# Patient Record
Sex: Female | Born: 1995 | Race: Black or African American | Hispanic: No | Marital: Single | Smoking: Former smoker
Health system: Southern US, Community
[De-identification: ages and names within clinical notes are randomized; demographics above are authoritative.]

## PROBLEM LIST (undated history)

## (undated) ENCOUNTER — Inpatient Hospital Stay (HOSPITAL_COMMUNITY): Payer: Self-pay

## (undated) DIAGNOSIS — F32A Depression, unspecified: Secondary | ICD-10-CM

## (undated) DIAGNOSIS — H539 Unspecified visual disturbance: Secondary | ICD-10-CM

## (undated) DIAGNOSIS — D649 Anemia, unspecified: Secondary | ICD-10-CM

## (undated) DIAGNOSIS — F419 Anxiety disorder, unspecified: Secondary | ICD-10-CM

## (undated) DIAGNOSIS — B009 Herpesviral infection, unspecified: Secondary | ICD-10-CM

## (undated) DIAGNOSIS — K5900 Constipation, unspecified: Secondary | ICD-10-CM

## (undated) DIAGNOSIS — F329 Major depressive disorder, single episode, unspecified: Secondary | ICD-10-CM

## (undated) HISTORY — PX: WISDOM TOOTH EXTRACTION: SHX21

---
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
## 2016-01-27 ENCOUNTER — Emergency Department (HOSPITAL_COMMUNITY)
Admission: EM | Admit: 2016-01-27 | Discharge: 2016-01-27 | Disposition: A | Discharge status: Home / Self Care | Payer: BLUE CROSS/BLUE SHIELD | Attending: Physician Assistant | Admitting: Physician Assistant

## 2016-01-27 ENCOUNTER — Encounter (HOSPITAL_COMMUNITY): Payer: Self-pay | Admitting: *Deleted

## 2016-01-27 DIAGNOSIS — R1030 Lower abdominal pain, unspecified: Secondary | ICD-10-CM

## 2016-01-27 DIAGNOSIS — R1033 Periumbilical pain: Secondary | ICD-10-CM | POA: Diagnosis not present

## 2016-01-27 DIAGNOSIS — F17200 Nicotine dependence, unspecified, uncomplicated: Secondary | ICD-10-CM | POA: Diagnosis not present

## 2016-01-27 LAB — URINALYSIS, ROUTINE W REFLEX MICROSCOPIC
Glucose, UA: NEGATIVE mg/dL
Hgb urine dipstick: NEGATIVE
Ketones, ur: NEGATIVE mg/dL
LEUKOCYTES UA: NEGATIVE
NITRITE: NEGATIVE
Protein, ur: NEGATIVE mg/dL
SPECIFIC GRAVITY, URINE: 1.027 (ref 1.005–1.030)
pH: 6.5 (ref 5.0–8.0)

## 2016-01-27 LAB — LIPASE, BLOOD: Lipase: 25 U/L (ref 11–51)

## 2016-01-27 LAB — COMPREHENSIVE METABOLIC PANEL
ALK PHOS: 45 U/L (ref 38–126)
ALT: 12 U/L — ABNORMAL LOW (ref 14–54)
ANION GAP: 8 (ref 5–15)
AST: 20 U/L (ref 15–41)
Albumin: 3.7 g/dL (ref 3.5–5.0)
BILIRUBIN TOTAL: 0.9 mg/dL (ref 0.3–1.2)
BUN: 10 mg/dL (ref 6–20)
CALCIUM: 9 mg/dL (ref 8.9–10.3)
CO2: 25 mmol/L (ref 22–32)
Chloride: 106 mmol/L (ref 101–111)
Creatinine, Ser: 0.77 mg/dL (ref 0.44–1.00)
GFR calc non Af Amer: >60 mL/min (ref >60)
Glucose, Bld: 92 mg/dL (ref 65–99)
POTASSIUM: 3.6 mmol/L (ref 3.5–5.1)
SODIUM: 139 mmol/L (ref 135–145)
TOTAL PROTEIN: 7.2 g/dL (ref 6.5–8.1)

## 2016-01-27 LAB — CBC
HEMATOCRIT: 39 % (ref 36.0–46.0)
HEMOGLOBIN: 12.6 g/dL (ref 12.0–15.0)
MCH: 26.5 pg (ref 26.0–34.0)
MCHC: 32.3 g/dL (ref 30.0–36.0)
MCV: 81.9 fL (ref 78.0–100.0)
Platelets: 321 10*3/uL (ref 150–400)
RBC: 4.76 MIL/uL (ref 3.87–5.11)
RDW: 14.9 % (ref 11.5–15.5)
WBC: 6.8 10*3/uL (ref 4.0–10.5)

## 2016-01-27 LAB — I-STAT BETA HCG BLOOD, ED (MC, WL, AP ONLY)

## 2016-01-27 MED ORDER — ONDANSETRON 4 MG PO TBDP
ORAL_TABLET | ORAL | Status: AC
  Filled 2016-01-27: qty 1

## 2016-01-27 MED ORDER — NAPROXEN 500 MG PO TABS
500.0000 mg | ORAL_TABLET | Freq: Two times a day (BID) | ORAL | 0 refills | Status: DC
Start: 2016-01-27 — End: 2016-03-24

## 2016-01-27 MED ORDER — ONDANSETRON 4 MG PO TBDP: 4.0000 mg | ORAL_TABLET | Freq: Three times a day (TID) | ORAL | 0 refills | Status: DC | PRN

## 2016-01-27 MED ORDER — NAPROXEN 250 MG PO TABS
500.0000 mg | ORAL_TABLET | Freq: Once | ORAL | Status: AC
  Administered 2016-01-27: 500 mg via ORAL
  Filled 2016-01-27: qty 2

## 2016-01-27 MED ORDER — ONDANSETRON 4 MG PO TBDP
4.0000 mg | ORAL_TABLET | Freq: Once | ORAL | Status: AC | PRN
  Administered 2016-01-27: 4 mg via ORAL

## 2016-01-27 NOTE — ED Notes (Signed)
Pt. Verbalized understanding of discharge instructions with no questions. VSS NAD

## 2016-01-27 NOTE — ED Provider Notes (Signed)
[facility]-EMERGENCY DEPT Provider Note   CSN: [id number] Arrival date & time: 01/27/16  1157     History   Chief Complaint Chief Complaint  Patient presents with  . Abdominal Pain    HPI Haley Lopez is a 20 y.o. female.  Patient with no past surgical history presents with complaint of abdominal pain and vomiting. Abdominal pain started last night. It is described as a sharp pain in her midline lower abdomen which is intermittent. Pain comes on approximately once per hour and is present for several minutes before resolving completely. Patient has had intermittent episodes of vomiting as well which does not necessarily coincide with the abdominal pain. Patient denies fever, chest pain, shortness of breath, diarrhea, urinary symptoms, vaginal bleeding or discharge. Last menstrual period was one week ago and was normal for the patient, however it was 1-2 weeks early. Patient does not feel that she is at high risk for sexually transmitted disease. No history of ovarian cysts. Patient currently is pain-free and has not vomited in the emergency department. Patient received Zofran upon arrival which has helped her feel better. The onset of this condition was acute. The course is constant. Aggravating factors: none. Alleviating factors: none.        Past Medical History:  Diagnosis Date  . Anxiety   . Constipation   . Vision abnormalities     Patient Active Problem List   Diagnosis Date Noted  . Normal labor 04/08/2015  . Preterm premature rupture of membranes (PPROM) with onset of labor within 24 hours of rupture in third trimester, antepartum 04/08/2015  . SVD (spontaneous vaginal delivery) 04/08/2015  . Depression, major, single episode, moderate (HCC) 02/25/2011  . 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 NO PAST SURGERIES      OB History    Gravida Para Term Preterm AB Living   1 1   1   1      SAB TAB Ectopic Multiple Live Births         0 1       Home Medications    Prior to Admission medications   Medication Sig Start Date End Date Taking? Authorizing Provider  naproxen (NAPROSYN) 500 MG tablet Take 1 tablet (500 mg total) by mouth 2 (two) times daily. 01/27/16   Renne CriglerJoshua Jhett Fretwell, PA-C  ondansetron (ZOFRAN ODT) 4 MG disintegrating tablet Take 1 tablet (4 mg total) by mouth every 8 (eight) hours as needed for nausea or vomiting. 01/27/16   Renne CriglerJoshua Madelynn Malson, PA-C    Family History Family History  Problem Relation Age of Onset  . Depression Mother     Social History Social History  Substance Use Topics  . Smoking status: Current Every Day Smoker  . Smokeless tobacco: Never Used  . Alcohol use No     Allergies   Review of patient's allergies indicates no known allergies.   Review of Systems Review of Systems  Constitutional: Negative for fever.  HENT: Negative for rhinorrhea and sore throat.   Eyes: Negative for redness.  Respiratory: Negative for cough.   Cardiovascular: Negative for chest pain.  Gastrointestinal: Positive for abdominal pain, nausea and vomiting. Negative for diarrhea.  Genitourinary: Negative for dysuria.  Musculoskeletal: Negative for myalgias.  Skin: Negative for rash.  Neurological: Negative for headaches.     Physical Exam Updated Vital Signs BP 113/79 (BP Location: Left Arm)   Pulse 86   Temp 99.2  F (37.3 C) (Oral)   Resp 19   LMP 01/25/2016   SpO2 100%   Physical Exam  Constitutional: She appears well-developed and well-nourished.  HENT:  Head: Normocephalic and atraumatic.  Eyes: Conjunctivae are normal. Right eye exhibits no discharge. Left eye exhibits no discharge.  Neck: Normal range of motion. Neck supple.  Cardiovascular: Normal rate, regular rhythm and normal heart sounds.   Pulmonary/Chest: Effort normal and breath sounds normal.  Abdominal: Soft. Bowel sounds are normal. There is tenderness (Mild) in the  periumbilical area and suprapubic area. There is no rebound, no guarding, no CVA tenderness and no tenderness at McBurney's point.  Neurological: She is alert.  Skin: Skin is warm and dry.  Psychiatric: She has a normal mood and affect.  Nursing note and vitals reviewed.    ED Treatments / Results  Labs (all labs ordered are listed, but only abnormal results are displayed) Labs Reviewed  COMPREHENSIVE METABOLIC PANEL - Abnormal; Notable for the following:       Result Value   ALT 12 (*)    All other components within normal limits  URINALYSIS, ROUTINE W REFLEX MICROSCOPIC (NOT AT [facility]) - Abnormal; Notable for the following:    Color, Urine AMBER (*)    APPearance HAZY (*)    Bilirubin Urine SMALL (*)    All other components within normal limits  LIPASE, BLOOD  CBC  I-STAT BETA HCG BLOOD, ED ([facility], [facility], [facility] ONLY)    EKG  EKG Interpretation None       Radiology No results found.  Procedures Procedures (including critical care time)  Medications Ordered in ED Medications  ondansetron (ZOFRAN-ODT) disintegrating tablet 4 mg (4 mg Oral Given 01/27/16 1209)  naproxen (NAPROSYN) tablet 500 mg (500 mg Oral Given 01/27/16 1334)     Initial Impression / Assessment and Plan / ED Course  I have reviewed the triage vital signs and the nursing notes.  Pertinent labs & imaging results that were available during my care of the patient were reviewed by me and considered in my medical decision making (see chart for details).  Clinical Course   Patient seen and examined. Work-up initiated. Medications ordered.   Vital signs reviewed and are as follows: BP 113/79 (BP Location: Left Arm)   Pulse 86   Temp 99.2 F (37.3 C) (Oral)   Resp 19   LMP 01/25/2016   SpO2 100%   Patient is minimally symptomatic at this time. Mild pain at time of exam. Pain is nonfocal in nature. No pain at McBurney's point. No lateralizing pain. We discussed all lab results. White blood cell count is  reassuring. Urine does not show infection. Patient is not pregnant.  She was given fluid trial as well as naproxen. This has improved her symptoms and she is feeling well. No change in exam at time of discharge. We discussed the unpredictable nature of abdominal pain and if her symptoms change or worsen, she needs to return to the emergency department or follow-up with her primary care physician for further evaluation and treatment.  The patient was urged to return to the Emergency Department immediately with worsening of current symptoms, worsening abdominal pain, persistent vomiting, blood noted in stools, fever, or any other concerns. The patient verbalized understanding.    Final Clinical Impressions(s) / ED Diagnoses   Final diagnoses:  Lower abdominal pain   Patient with abdominal pain, Reassuring exam and labs. Vitals are stable, no fever. Labs normal white blood cell count, normal UA.  Imaging not felt indicated at current time. No signs of dehydration, patient is tolerating PO's. Lungs are clear and no signs suggestive of PNA. No focal abd pain on exam. Pain is colicky in nature. Low concern for appendicitis, cholecystitis, pancreatitis, ruptured viscus, UTI, kidney stone, aortic dissection, aortic aneurysm or other emergent abdominal etiology. Doubt PID/TOA given lack of vaginal complaints. Doubt ovarian torsion given lack of persistent pain, lateral pain. Supportive therapy indicated with return if symptoms worsen.     New Prescriptions Discharge Medication List as of 01/27/2016  4:14 PM    START taking these medications   Details  naproxen (NAPROSYN) 500 MG tablet Take 1 tablet (500 mg total) by mouth 2 (two) times daily., Starting Wed 01/27/2016, Print    ondansetron (ZOFRAN ODT) 4 MG disintegrating tablet Take 1 tablet (4 mg total) by mouth every 8 (eight) hours as needed for nausea or vomiting., Starting Wed 01/27/2016, Print         [place], PA-C 01/27/16 1653      Courteney Randall An, MD 01/28/16 1516

## 2016-01-27 NOTE — ED Notes (Signed)
Patient given a cup of ice and a coke to drink

## 2016-01-27 NOTE — ED Triage Notes (Signed)
Pt reports having N/V and abdominal pain for several days. Pt reports that her period came 2 weeks early this time as well.

## 2016-01-27 NOTE — Discharge Instructions (Signed)
Please read and follow all provided instructions.  Your diagnoses today include:  1. Lower abdominal pain     Tests performed today include:  Blood counts and electrolytes  Blood tests to check liver and kidney function  Blood tests to check pancreas function  Urine test to look for infection and pregnancy  Vital signs. See below for your results today.   Medications prescribed:   Zofran (ondansetron) - for nausea and vomiting   Naproxen - anti-inflammatory pain medication  Do not exceed 500mg  naproxen every 12 hours, take with food  You have been prescribed an anti-inflammatory medication or NSAID. Take with food. Take smallest effective dose for the shortest duration needed for your pain. Stop taking if you experience stomach pain or vomiting.   Take any prescribed medications only as directed.  Home care instructions:   Follow any educational materials contained in this packet.  Follow-up instructions: Please follow-up with your primary care provider in the next 3 days for further evaluation of your symptoms.    Return instructions:  SEEK IMMEDIATE MEDICAL ATTENTION IF:  The pain does not go away or becomes severe   A temperature above 101F develops   Repeated vomiting occurs (multiple episodes)   The pain becomes localized to portions of the abdomen. The right side could possibly be appendicitis. In an adult, the left lower portion of the abdomen could be colitis or diverticulitis.   Blood is being passed in stools or vomit (bright red or black tarry stools)   You develop chest pain, difficulty breathing, dizziness or fainting, or become confused, poorly responsive, or inconsolable (young children)  If you have any other emergent concerns regarding your health  Additional Information: Abdominal (belly) pain can be caused by many things. Your caregiver performed an examination and possibly ordered blood/urine tests and imaging (CT scan, x-rays, ultrasound).  Many cases can be observed and treated at home after initial evaluation in the emergency department. Even though you are being discharged home, abdominal pain can be unpredictable. Therefore, you need a repeated exam if your pain does not resolve, returns, or worsens. Most patients with abdominal pain don't have to be admitted to the hospital or have surgery, but serious problems like appendicitis and gallbladder attacks can start out as nonspecific pain. Many abdominal conditions cannot be diagnosed in one visit, so follow-up evaluations are very important.  Your vital signs today were: BP 111/73 (BP Location: Left Arm)    Pulse 85    Temp 99.2 F (37.3 C) (Oral)    Resp 20    LMP 01/25/2016    SpO2 100%  If your blood pressure (bp) was elevated above 135/85 this visit, please have this repeated by your doctor within one month. --------------

## 2016-03-24 ENCOUNTER — Encounter (HOSPITAL_COMMUNITY): Payer: Self-pay | Admitting: *Deleted

## 2016-03-24 ENCOUNTER — Inpatient Hospital Stay (HOSPITAL_COMMUNITY): Payer: BLUE CROSS/BLUE SHIELD

## 2016-03-24 ENCOUNTER — Inpatient Hospital Stay (HOSPITAL_COMMUNITY)
Admission: AD | Admit: 2016-03-24 | Discharge: 2016-03-24 | Disposition: A | Discharge status: Home / Self Care | Payer: BLUE CROSS/BLUE SHIELD | Source: Ambulatory Visit | Attending: Obstetrics and Gynecology | Admitting: Obstetrics and Gynecology

## 2016-03-24 DIAGNOSIS — IMO0002 Reserved for concepts with insufficient information to code with codable children: Secondary | ICD-10-CM | POA: Diagnosis present

## 2016-03-24 DIAGNOSIS — Z3491 Encounter for supervision of normal pregnancy, unspecified, first trimester: Secondary | ICD-10-CM

## 2016-03-24 DIAGNOSIS — R102 Pelvic and perineal pain: Secondary | ICD-10-CM | POA: Diagnosis not present

## 2016-03-24 DIAGNOSIS — O209 Hemorrhage in early pregnancy, unspecified: Secondary | ICD-10-CM | POA: Diagnosis not present

## 2016-03-24 DIAGNOSIS — R1030 Lower abdominal pain, unspecified: Secondary | ICD-10-CM | POA: Diagnosis present

## 2016-03-24 DIAGNOSIS — O26891 Other specified pregnancy related conditions, first trimester: Secondary | ICD-10-CM

## 2016-03-24 DIAGNOSIS — Z3A01 Less than 8 weeks gestation of pregnancy: Secondary | ICD-10-CM | POA: Diagnosis not present

## 2016-03-24 HISTORY — DX: Major depressive disorder, single episode, unspecified: F32.9

## 2016-03-24 HISTORY — DX: Depression, unspecified: F32.A

## 2016-03-24 LAB — CBC
HCT: 33.4 % — ABNORMAL LOW (ref 36.0–46.0)
HEMOGLOBIN: 10.9 g/dL — AB (ref 12.0–15.0)
MCH: 26.7 pg (ref 26.0–34.0)
MCHC: 32.6 g/dL (ref 30.0–36.0)
MCV: 81.9 fL (ref 78.0–100.0)
Platelets: 283 10*3/uL (ref 150–400)
RBC: 4.08 MIL/uL (ref 3.87–5.11)
RDW: 15 % (ref 11.5–15.5)
WBC: 5.4 10*3/uL (ref 4.0–10.5)

## 2016-03-24 LAB — URINALYSIS, ROUTINE W REFLEX MICROSCOPIC
BILIRUBIN URINE: NEGATIVE
GLUCOSE, UA: NEGATIVE mg/dL
HGB URINE DIPSTICK: NEGATIVE
Ketones, ur: NEGATIVE mg/dL
Leukocytes, UA: NEGATIVE
Nitrite: NEGATIVE
PH: 7 (ref 5.0–8.0)
Protein, ur: NEGATIVE mg/dL
SPECIFIC GRAVITY, URINE: 1.02 (ref 1.005–1.030)

## 2016-03-24 LAB — WET PREP, GENITAL
Clue Cells Wet Prep HPF POC: NONE SEEN
SPERM: NONE SEEN
Trich, Wet Prep: NONE SEEN
YEAST WET PREP: NONE SEEN

## 2016-03-24 LAB — POCT PREGNANCY, URINE: Preg Test, Ur: POSITIVE — AB

## 2016-03-24 LAB — HCG, QUANTITATIVE, PREGNANCY: HCG, BETA CHAIN, QUANT, S: 31494 m[IU]/mL — AB (ref <5)

## 2016-03-24 LAB — HEPATITIS B SURFACE ANTIGEN: HEP B S AG: NEGATIVE
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
## 2016-03-24 NOTE — Discharge Instructions (Signed)
Vaginal Bleeding During Pregnancy, First Trimester °A small amount of bleeding (spotting) from the vagina is relatively common in early pregnancy. It usually stops on its own. Various things may cause bleeding or spotting in early pregnancy. Some bleeding may be related to the pregnancy, and some may not. In most cases, the bleeding is normal and is not a problem. However, bleeding can also be a sign of something serious. Be sure to tell your health care provider about any vaginal bleeding right away. °Some possible causes of vaginal bleeding during the first trimester include: °· Infection or inflammation of the cervix. °· Growths (polyps) on the cervix. °· Miscarriage or threatened miscarriage. °· Pregnancy tissue has developed outside of the uterus and in a fallopian tube (tubal pregnancy). °· Tiny cysts have developed in the uterus instead of pregnancy tissue (molar pregnancy). °Follow these instructions at home: °Watch your condition for any changes. The following actions may help to lessen any discomfort you are feeling: °· Follow your health care provider's instructions for limiting your activity. If your health care provider orders bed rest, you may need to stay in bed and only get up to use the bathroom. However, your health care provider may allow you to continue light activity. °· If needed, make plans for someone to help with your regular activities and responsibilities while you are on bed rest. °· Keep track of the number of pads you use each day, how often you change pads, and how soaked (saturated) they are. Write this down. °· Do not use tampons. Do not douche. °· Do not have sexual intercourse or orgasms until approved by your health care provider. °· If you pass any tissue from your vagina, save the tissue so you can show it to your health care provider. °· Only take over-the-counter or prescription medicines as directed by your health care provider. °· Do not take aspirin because it can make you  bleed. °· Keep all follow-up appointments as directed by your health care provider. °Contact a health care provider if: °· You have any vaginal bleeding during any part of your pregnancy. °· You have cramps or labor pains. °· You have a fever, not controlled by medicine. °Get help right away if: °· You have severe cramps in your back or belly (abdomen). °· You pass large clots or tissue from your vagina. °· Your bleeding increases. °· You feel light-headed or weak, or you have fainting episodes. °· You have chills. °· You are leaking fluid or have a gush of fluid from your vagina. °· You pass out while having a bowel movement. °This information is not intended to replace advice given to you by your health care provider. Make sure you discuss any questions you have with your health care provider. °Document Released: 01/05/2005 Document Revised: 09/03/2015 Document Reviewed: 12/03/2012 °Elsevier Interactive Patient Education © 2017 [employer] ° °
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
## 2016-03-25 LAB — HSV 1 ANTIBODY, IGG: HSV 1 Glycoprotein G Ab, IgG: <0.91 index (ref 0.00–0.90)

## 2016-03-25 LAB — GC/CHLAMYDIA PROBE AMP (~~LOC~~) NOT AT ARMC
CHLAMYDIA, DNA PROBE: NEGATIVE
NEISSERIA GONORRHEA: NEGATIVE

## 2016-03-25 LAB — RPR: RPR: NONREACTIVE

## 2016-03-25 LAB — HIV ANTIBODY (ROUTINE TESTING W REFLEX): HIV SCREEN 4TH GENERATION: NONREACTIVE

## 2016-03-25 LAB — HSV 2 ANTIBODY, IGG: HSV 2 Glycoprotein G Ab, IgG: <0.91 index (ref 0.00–0.90)

## 2016-04-21 ENCOUNTER — Ambulatory Visit (HOSPITAL_COMMUNITY)
Admission: AD | Admit: 2016-04-21 | Discharge: 2016-04-21 | Disposition: A | Discharge status: Home / Self Care | Payer: BLUE CROSS/BLUE SHIELD | Source: Ambulatory Visit | Attending: Obstetrics and Gynecology | Admitting: Obstetrics and Gynecology

## 2016-04-21 ENCOUNTER — Encounter (HOSPITAL_COMMUNITY): Payer: Self-pay | Admitting: Anesthesiology

## 2016-04-21 ENCOUNTER — Encounter (HOSPITAL_COMMUNITY)
Admission: AD | Disposition: A | Discharge status: Home / Self Care | Payer: Self-pay | Source: Ambulatory Visit | Attending: Obstetrics and Gynecology

## 2016-04-21 ENCOUNTER — Ambulatory Visit (HOSPITAL_COMMUNITY): Payer: BLUE CROSS/BLUE SHIELD | Admitting: Anesthesiology

## 2016-04-21 DIAGNOSIS — O021 Missed abortion: Secondary | ICD-10-CM

## 2016-04-21 DIAGNOSIS — F419 Anxiety disorder, unspecified: Secondary | ICD-10-CM | POA: Diagnosis not present

## 2016-04-21 DIAGNOSIS — F329 Major depressive disorder, single episode, unspecified: Secondary | ICD-10-CM | POA: Diagnosis not present

## 2016-04-21 HISTORY — PX: DILATION AND EVACUATION: SHX1459

## 2016-04-21 LAB — CBC
HEMATOCRIT: 31.6 % — AB (ref 36.0–46.0)
HEMOGLOBIN: 10.5 g/dL — AB (ref 12.0–15.0)
MCH: 26.8 pg (ref 26.0–34.0)
MCHC: 33.2 g/dL (ref 30.0–36.0)
MCV: 80.6 fL (ref 78.0–100.0)
Platelets: 306 10*3/uL (ref 150–400)
RBC: 3.92 MIL/uL (ref 3.87–5.11)
RDW: 15 % (ref 11.5–15.5)
WBC: 6.2 10*3/uL (ref 4.0–10.5)

## 2016-04-21 SURGERY — DILATION AND EVACUATION, UTERUS
Anesthesia: Monitor Anesthesia Care | Site: Vagina

## 2016-04-21 MED ORDER — LACTATED RINGERS IV SOLN
INTRAVENOUS | Status: DC
  Administered 2016-04-21: 125 mL/h via INTRAVENOUS

## 2016-04-21 MED ORDER — DOXYCYCLINE HYCLATE 100 MG IV SOLR
200.0000 mg | Freq: Once | INTRAVENOUS | Status: AC
  Administered 2016-04-21: 200 mg via INTRAVENOUS
  Filled 2016-04-21: qty 200

## 2016-04-21 MED ORDER — LIDOCAINE HCL (CARDIAC) 20 MG/ML IV SOLN
INTRAVENOUS | Status: AC
  Filled 2016-04-21: qty 5

## 2016-04-21 MED ORDER — GLYCOPYRROLATE 0.2 MG/ML IJ SOLN
INTRAMUSCULAR | Status: DC | PRN
  Administered 2016-04-21: 0.1 mg via INTRAVENOUS

## 2016-04-21 MED ORDER — KETOROLAC TROMETHAMINE 30 MG/ML IJ SOLN
INTRAMUSCULAR | Status: DC | PRN
  Administered 2016-04-21: 30 mg via INTRAVENOUS

## 2016-04-21 MED ORDER — METOCLOPRAMIDE HCL 5 MG/ML IJ SOLN
INTRAMUSCULAR | Status: AC
  Filled 2016-04-21: qty 2

## 2016-04-21 MED ORDER — ONDANSETRON HCL 4 MG/2ML IJ SOLN
INTRAMUSCULAR | Status: DC | PRN
  Administered 2016-04-21: 4 mg via INTRAVENOUS

## 2016-04-21 MED ORDER — FENTANYL CITRATE (PF) 100 MCG/2ML IJ SOLN
INTRAMUSCULAR | Status: DC | PRN
  Administered 2016-04-21: 100 ug via INTRAVENOUS

## 2016-04-21 MED ORDER — SCOPOLAMINE 1 MG/3DAYS TD PT72
1.0000 | MEDICATED_PATCH | Freq: Once | TRANSDERMAL | Status: DC
  Administered 2016-04-21: 1.5 mg via TRANSDERMAL

## 2016-04-21 MED ORDER — LIDOCAINE HCL 2 % IJ SOLN
INTRAMUSCULAR | Status: AC
  Filled 2016-04-21: qty 20

## 2016-04-21 MED ORDER — MIDAZOLAM HCL 2 MG/2ML IJ SOLN
INTRAMUSCULAR | Status: AC
  Filled 2016-04-21: qty 2

## 2016-04-21 MED ORDER — PROPOFOL 10 MG/ML IV BOLUS
INTRAVENOUS | Status: AC
  Filled 2016-04-21: qty 20

## 2016-04-21 MED ORDER — ONDANSETRON HCL 4 MG/2ML IJ SOLN
INTRAMUSCULAR | Status: AC
  Filled 2016-04-21: qty 2

## 2016-04-21 MED ORDER — DEXAMETHASONE SODIUM PHOSPHATE 10 MG/ML IJ SOLN
INTRAMUSCULAR | Status: AC
  Filled 2016-04-21: qty 1

## 2016-04-21 MED ORDER — LIDOCAINE HCL (CARDIAC) 20 MG/ML IV SOLN
INTRAVENOUS | Status: DC | PRN
Start: 2016-04-21 — End: 2016-04-21
  Administered 2016-04-21: 50 mg via INTRAVENOUS

## 2016-04-21 MED ORDER — PROPOFOL 500 MG/50ML IV EMUL
INTRAVENOUS | Status: DC | PRN
  Administered 2016-04-21: 75 ug/kg/min via INTRAVENOUS

## 2016-04-21 MED ORDER — PROPOFOL 10 MG/ML IV BOLUS
INTRAVENOUS | Status: DC | PRN
  Administered 2016-04-21: 20 mg via INTRAVENOUS
  Administered 2016-04-21: 10 mg via INTRAVENOUS

## 2016-04-21 MED ORDER — KETOROLAC TROMETHAMINE 30 MG/ML IJ SOLN
INTRAMUSCULAR | Status: AC
  Filled 2016-04-21: qty 1

## 2016-04-21 MED ORDER — MIDAZOLAM HCL 2 MG/2ML IJ SOLN
INTRAMUSCULAR | Status: DC | PRN
  Administered 2016-04-21: 2 mg via INTRAVENOUS

## 2016-04-21 MED ORDER — FENTANYL CITRATE (PF) 100 MCG/2ML IJ SOLN
INTRAMUSCULAR | Status: AC
  Filled 2016-04-21: qty 2

## 2016-04-21 MED ORDER — SCOPOLAMINE 1 MG/3DAYS TD PT72
MEDICATED_PATCH | TRANSDERMAL | Status: AC
  Administered 2016-04-21: 1.5 mg via TRANSDERMAL
  Filled 2016-04-21: qty 1

## 2016-04-21 MED ORDER — DEXAMETHASONE SODIUM PHOSPHATE 4 MG/ML IJ SOLN
INTRAMUSCULAR | Status: DC | PRN
  Administered 2016-04-21: 10 mg via INTRAVENOUS

## 2016-04-21 MED ORDER — LIDOCAINE HCL 2 % IJ SOLN
INTRAMUSCULAR | Status: DC | PRN
  Administered 2016-04-21: 16 mL

## 2016-04-21 SURGICAL SUPPLY — 19 items
CATH ROBINSON RED A/P 16FR (CATHETERS) ×3 IMPLANT
CLOTH BEACON ORANGE TIMEOUT ST (SAFETY) ×3 IMPLANT
DECANTER SPIKE VIAL GLASS SM (MISCELLANEOUS) ×3 IMPLANT
GLOVE BIO SURGEON STRL SZ8 (GLOVE) ×3 IMPLANT
GLOVE BIOGEL PI IND STRL 7.0 (GLOVE) ×1 IMPLANT
GLOVE BIOGEL PI INDICATOR 7.0 (GLOVE) ×2
GLOVE ORTHO TXT STRL SZ7.5 (GLOVE) ×3 IMPLANT
GOWN STRL REUS W/TWL LRG LVL3 (GOWN DISPOSABLE) ×6 IMPLANT
KIT BERKELEY 1ST TRIMESTER 3/8 (MISCELLANEOUS) ×3 IMPLANT
NS IRRIG 1000ML POUR BTL (IV SOLUTION) ×3 IMPLANT
PACK VAGINAL MINOR WOMEN LF (CUSTOM PROCEDURE TRAY) ×3 IMPLANT
PAD OB MATERNITY 4.3X12.25 (PERSONAL CARE ITEMS) ×3 IMPLANT
PAD PREP 24X48 CUFFED NSTRL (MISCELLANEOUS) ×3 IMPLANT
SET BERKELEY SUCTION TUBING (SUCTIONS) ×3 IMPLANT
TOWEL OR 17X24 6PK STRL BLUE (TOWEL DISPOSABLE) ×6 IMPLANT
VACURETTE 10 RIGID CVD (CANNULA) IMPLANT
VACURETTE 7MM CVD STRL WRAP (CANNULA) IMPLANT
VACURETTE 8 RIGID CVD (CANNULA) ×3 IMPLANT
VACURETTE 9 RIGID CVD (CANNULA) IMPLANT

## 2016-04-21 NOTE — Op Note (Signed)
  Preoperative Diagnosis:  Missed abortion at 8 weeks Postop Diagnosis:  Same Procedure:  D&E Anesthesia:  MAC, paracervical block Findings: Cervix was closed, uterus was normal size, abundant products of conception were obtained Specimens: Products of conception sent for routine pathology Estimated blood loss: Minimal Complications: None  Procedure in detail: The patient was taken to the operating room and placed in the dorsosupine position. IV sedation was given and she was placed and mobile stirrups. Perineum and vagina were prepped and draped in the usual sterile fashion, bladder drained with a red Robinson catheter. A Graves speculum was inserted in the vagina. The anterior lip of the cervix was grasped with a single-tooth tenaculum. Paracervical block was then performed with a total of 16 cc of 2% plain lidocaine. Uterus then sounded to 9 cm. Cervix was gradually easily dilated to size 27 dilator. A size 8 curved suction curet was then inserted without difficulty. Suction curettage was return was performed with return of abundant products of conception. Sharp curettage was performed with which revealed good uterine cry in all quadrants and no significant tissue. Suction curettage was performed one more time which revealed minimal blood. The single-tooth tenaculum was removed from the cervix and bleeding was controlled with pressure. All instruments were removed from the vagina. The patient was taken to the recovery in stable condition after tolerating the procedure well. Counts were correct, she received doxycycline at the beginning of the procedure and had PAS hose on throughout the procedure.

## 2016-04-21 NOTE — Transfer of Care (Signed)
Immediate Anesthesia Transfer of Care Note  Patient: Haley Lopez  Procedure(s) Performed: Procedure(s): DILATATION AND EVACUATION (N/A)  Patient Location: PACU  Anesthesia Type:MAC  Level of Consciousness: awake, alert  and oriented  Airway & Oxygen Therapy: Patient Spontanous Breathing and Patient connected to nasal cannula oxygen  Post-op Assessment: Report given to RN and Post -op Vital signs reviewed and stable  Post vital signs: Reviewed and stable  Last Vitals:  Vitals:   04/21/16 1133  BP: 110/72  Pulse: 63  Resp: 16  Temp: 36.9 C    Last Pain:  Vitals:   04/21/16 1133  TempSrc: Oral         Complications: No apparent anesthesia complications

## 2016-04-21 NOTE — Anesthesia Preprocedure Evaluation (Addendum)
Anesthesia Evaluation  Patient identified by MRN, date of birth, ID band Patient awake    Reviewed: Allergy & Precautions, NPO status , Patient's Chart, lab work & pertinent test results  Airway Mallampati: II  TM Distance: >3 FB Neck ROM: Full    Dental  (+) Teeth Intact, Dental Advisory Given   Pulmonary neg pulmonary ROS,    breath sounds clear to auscultation       Cardiovascular negative cardio ROS   Rhythm:Regular Rate:Normal     Neuro/Psych PSYCHIATRIC DISORDERS Anxiety Depression negative neurological ROS     GI/Hepatic negative GI ROS, Neg liver ROS,   Endo/Other  negative endocrine ROS  Renal/GU negative Renal ROS  negative genitourinary   Musculoskeletal negative musculoskeletal ROS (+)   Abdominal   Peds negative pediatric ROS (+)  Hematology negative hematology ROS (+)   Anesthesia Other Findings   Reproductive/Obstetrics                            Lab Results  Component Value Date   WBC 5.4 03/24/2016   HGB 10.9 (L) 03/24/2016   HCT 33.4 (L) 03/24/2016   MCV 81.9 03/24/2016   PLT 283 03/24/2016   No results found for: INR, PROTIME   Anesthesia Physical Anesthesia Plan  ASA: II  Anesthesia Plan: MAC   Post-op Pain Management:    Induction: Intravenous  Airway Management Planned: Natural Airway and Nasal Cannula  Additional Equipment:   Intra-op Plan:   Post-operative Plan:   Informed Consent: I have reviewed the patients History and Physical, chart, labs and discussed the procedure including the risks, benefits and alternatives for the proposed anesthesia with the patient or authorized representative who has indicated his/her understanding and acceptance.     Plan Discussed with: CRNA  Anesthesia Plan Comments:         Anesthesia Quick Evaluation

## 2016-04-21 NOTE — H&P (Signed)
Haley Lopez is an 21 y.o. female. She has a missed ab at 8 weeks, all options discussed, she wants D&E.  Pertinent Gynecological History: OB History: G3, P1021   Menstrual History: No LMP recorded (lmp unknown). Patient is pregnant.    Past Medical History:  Diagnosis Date  . Anxiety   . Constipation   . Depression   . Vision abnormalities     Past Surgical History:  Procedure Laterality Date  . NO PAST SURGERIES    . WISDOM TOOTH EXTRACTION      Family History  Problem Relation Age of Onset  . Depression Mother     Social History:  reports that she has never smoked. She has never used smokeless tobacco. She reports that she uses drugs, including Marijuana. She reports that she does not drink alcohol.  Allergies: No Known Allergies  No prescriptions prior to admission.    Review of Systems  Respiratory: Negative.   Cardiovascular: Negative.   Gastrointestinal: Negative.   Genitourinary: Negative.     unknown if currently breastfeeding. Physical Exam  Vitals reviewed. Cardiovascular: Normal rate, regular rhythm and normal heart sounds.   No murmur heard. Respiratory: Effort normal and breath sounds normal. No respiratory distress.  GI: Soft. She exhibits no distension and no mass. There is no tenderness.  Genitourinary: Vagina normal and uterus normal.    No results found for this or any previous visit (from the past 24 hour(s)).  No results found.  Assessment/Plan: Missed abortion at 8 weeks, Rh positive.  Will admit for D&E at patient request, surgery and risks discussed.    Derak Schurman D 04/21/2016, 9:59 AM

## 2016-04-21 NOTE — Anesthesia Postprocedure Evaluation (Signed)
Anesthesia Post Note  Patient: Haley Lopez  Procedure(s) Performed: Procedure(s) (LRB): DILATATION AND EVACUATION (N/A)  Patient location during evaluation: PACU Anesthesia Type: MAC Level of consciousness: awake and alert Pain management: pain level controlled Vital Signs Assessment: post-procedure vital signs reviewed and stable Respiratory status: spontaneous breathing, nonlabored ventilation, respiratory function stable and patient connected to nasal cannula oxygen Cardiovascular status: stable and blood pressure returned to baseline Anesthetic complications: no        Last Vitals:  Vitals:   04/21/16 1330 04/21/16 1415  BP: 111/76 (!) 110/56  Pulse: (!) 50 (!) 55  Resp: 14 16  Temp:      Last Pain:  Vitals:   04/21/16 1133  TempSrc: Oral   Pain Goal:                 Effie Berkshire

## 2016-04-21 NOTE — Discharge Instructions (Signed)
DISCHARGE INSTRUCTIONS: D&C / D&E The following instructions have been prepared to help you care for yourself upon your return home.   Personal hygiene:  Use sanitary pads for vaginal drainage, not tampons.  Shower the day after your procedure.  NO tub baths, pools or Jacuzzis for 2-3 weeks.  Wipe front to back after using the bathroom.  Activity and limitations:  Do NOT drive or operate any equipment for 24 hours. The effects of anesthesia are still present and drowsiness may result.  Do NOT rest in bed all day.  Walking is encouraged.  Walk up and down stairs slowly.  You may resume your normal activity in one to two days or as indicated by your physician.  Sexual activity: NO intercourse for at least 2 weeks after the procedure, or as indicated by your physician.  Diet: Eat a light meal as desired this evening. You may resume your usual diet tomorrow.  Return to work: You may resume your work activities in one to two days or as indicated by your doctor.  What to expect after your surgery: Expect to have vaginal bleeding/discharge for 2-3 days and spotting for up to 10 days. It is not unusual to have soreness for up to 1-2 weeks. You may have a slight burning sensation when you urinate for the first day. Mild cramps may continue for a couple of days. You may have a regular period in 2-6 weeks.  Call your doctor for any of the following:  Excessive vaginal bleeding, saturating and changing one pad every hour.  Inability to urinate 6 hours after discharge from hospital.  Pain not relieved by pain medication.  Fever of 100.4 F or greater.  Unusual vaginal discharge or odor.   Call for an appointment:    Patients signature: ______________________  Nurses signature ________________________  Support person's signature_______________________   Routine instructions for D&E

## 2016-04-21 NOTE — Interval H&P Note (Signed)
History and Physical Interval Note:  04/21/2016 12:05 PM  Haley Lopez  has presented today for surgery, with the diagnosis of missed AB  The various methods of treatment have been discussed with the patient and family. After consideration of risks, benefits and other options for treatment, the patient has consented to  Procedure(s): DILATATION AND EVACUATION (N/A) as a surgical intervention .  The patient's history has been reviewed, patient examined, no change in status, stable for surgery.  I have reviewed the patient's chart and labs.  Questions were answered to the patient's satisfaction.     Kenith Trickel D

## 2016-04-22 ENCOUNTER — Encounter (HOSPITAL_COMMUNITY): Payer: Self-pay | Admitting: Obstetrics and Gynecology

## 2016-06-10 ENCOUNTER — Telehealth: Payer: Self-pay | Admitting: Nurse Practitioner

## 2016-06-10 DIAGNOSIS — M5442 Lumbago with sciatica, left side: Principal | ICD-10-CM

## 2016-06-10 DIAGNOSIS — M5441 Lumbago with sciatica, right side: Secondary | ICD-10-CM

## 2016-06-10 NOTE — Progress Notes (Signed)
Based on what you shared with me it looks like you have a serious condition that should be evaluated in a face to face office visit.  NOTE: Even if you have entered your credit card information for this eVisit, you will not be charged.   *I AM SO SORRY BUT I AM UNABLE TO DO ANYTHING BECAUSE YOU MAY BE PREGNANT! I KNOW IT IS SAFE TO TAKE TYLENOL , BUT THAT IS NOT MY SPECIALITY. AGAIN I AM SO SORRY!  If you are having a true medical emergency please call 911.  If you need an urgent face to face visit,  has four urgent care centers for your convenience.  If you need care fast and have a high deductible or no insurance consider:   [email or web address]  [phone]  [address], [address] [place], [place] [address] 8 am to 8 pm Monday-Friday 10 am to 4 pm Saturday-Sunday   The following sites will take your  insurance:    . [facility]  [phone] Get Driving Directions Find a Provider at this Location  [address] [place], [place] [address] . 10 am to 8 pm Monday-Friday . 12 pm to 8 pm Saturday-Sunday   . [facility]  [phone] Get Driving Directions Find a Provider at this Location  [address], [address] [place], [place] [address] . 8 am to 8 pm Monday-Friday . 9 am to 6 pm Saturday . 11 am to 6 pm Sunday   . [facility] [place]  [phone] Get Driving Directions  [address] [place], [place] [address] . 8 am to 8 pm Monday-Friday . 8 am to 4 pm Saturday-Sunday   Your e-visit answers were reviewed by a board certified advanced clinical practitioner to complete your personal care plan.  Thank you for using e-Visits.

## 2016-06-11 ENCOUNTER — Encounter (HOSPITAL_COMMUNITY): Payer: Self-pay | Admitting: *Deleted

## 2016-06-11 ENCOUNTER — Inpatient Hospital Stay (HOSPITAL_COMMUNITY)
Admission: AD | Admit: 2016-06-11 | Discharge: 2016-06-11 | Disposition: A | Discharge status: Home / Self Care | Payer: BLUE CROSS/BLUE SHIELD | Source: Ambulatory Visit | Attending: Obstetrics and Gynecology | Admitting: Obstetrics and Gynecology

## 2016-06-11 DIAGNOSIS — N946 Dysmenorrhea, unspecified: Secondary | ICD-10-CM | POA: Insufficient documentation

## 2016-06-11 DIAGNOSIS — R102 Pelvic and perineal pain: Secondary | ICD-10-CM | POA: Diagnosis not present

## 2016-06-11 DIAGNOSIS — Z9889 Other specified postprocedural states: Secondary | ICD-10-CM | POA: Insufficient documentation

## 2016-06-11 DIAGNOSIS — F329 Major depressive disorder, single episode, unspecified: Secondary | ICD-10-CM | POA: Insufficient documentation

## 2016-06-11 DIAGNOSIS — Z79899 Other long term (current) drug therapy: Secondary | ICD-10-CM | POA: Diagnosis not present

## 2016-06-11 DIAGNOSIS — O021 Missed abortion: Secondary | ICD-10-CM | POA: Insufficient documentation

## 2016-06-11 DIAGNOSIS — F419 Anxiety disorder, unspecified: Secondary | ICD-10-CM | POA: Diagnosis not present

## 2016-06-11 DIAGNOSIS — R51 Headache: Secondary | ICD-10-CM | POA: Diagnosis not present

## 2016-06-11 DIAGNOSIS — N926 Irregular menstruation, unspecified: Secondary | ICD-10-CM

## 2016-06-11 DIAGNOSIS — M549 Dorsalgia, unspecified: Secondary | ICD-10-CM | POA: Insufficient documentation

## 2016-06-11 LAB — URINALYSIS, ROUTINE W REFLEX MICROSCOPIC
Glucose, UA: NEGATIVE mg/dL
Ketones, ur: 5 mg/dL — AB
LEUKOCYTES UA: NEGATIVE
NITRITE: NEGATIVE
PH: 5 (ref 5.0–8.0)
Protein, ur: 30 mg/dL — AB
Specific Gravity, Urine: 1.031 — ABNORMAL HIGH (ref 1.005–1.030)

## 2016-06-11 LAB — POCT PREGNANCY, URINE: Preg Test, Ur: NEGATIVE

## 2016-06-11 NOTE — MAU Note (Addendum)
Had pounding headache Friday and a fever. Also had lower back pain and abd pain. Used heating pad on back and it helped. Still having slight lower back and abd pain and slight headache. Did evisit and was told to come in. Started having vag bleeding tonight and used one pad so far. Had D&E in Jan for missed AB.

## 2016-06-11 NOTE — Discharge Instructions (Signed)
Dysmenorrhea Dysmenorrhea means painful cramps during your period (menstrual period). You will have pain in your lower belly (abdomen). The pain is caused by the tightening (contracting) of the muscles of the womb (uterus). The pain may be mild or very bad. With this condition, you may:  Have a headache.  Feel sick to your stomach (nauseous).  Throw up (vomit).  Have lower back pain. Follow these instructions at home: Helping pain and cramping   Put heat on your lower back or belly when you have pain or cramps. Use the heat source that your doctor tells you to use.  Place a towel between your skin and the heat.  Leave the heat on for 20-30 minutes.  Remove the heat if your skin turns bright red. This is especially important if you cannot feel pain, heat, or cold.  Do not have a heating pad on during sleep.  Do aerobic exercises. These include walking, swimming, or biking. These may help with cramps.  Massage your lower back or belly. This may help lessen pain. General instructions   Take over-the-counter and prescription medicines only as told by your doctor.  Do not drive or use heavy machinery while taking prescription pain medicine.  Avoid alcohol and caffeine during and right before your period. These can make cramps worse.  Do not use any products that have nicotine or tobacco. These include cigarettes and e-cigarettes. If you need help quitting, ask your doctor.  Keep all follow-up visits as told by your doctor. This is important. Contact a doctor if:  You have pain that gets worse.  You have pain that does not get better with medicine.  You have pain during sex.  You feel sick to your stomach or you throw up during your period, and medicine does not help. Get help right away if:  You pass out (faint). Summary  Dysmenorrhea means painful cramps during your period (menstrual period).  Put heat on your lower back or belly when you have pain or cramps.  Do  exercises like walking, swimming, or biking to help with cramps.  Contact a doctor if you have pain during sex. This information is not intended to replace advice given to you by your health care provider. Make sure you discuss any questions you have with your health care provider. Document Released: 06/24/2008 Document Revised: 04/14/2016 Document Reviewed: 04/14/2016 Elsevier Interactive Patient Education  2017 Elsevier Inc.  

## 2016-06-11 NOTE — MAU Provider Note (Signed)
History     CSN: [id number]  Arrival date and time: 06/11/16 8295   First Provider Initiated Contact with Patient 06/11/16 0051      Chief Complaint  Patient presents with  . Back Pain  . Vaginal Bleeding   HPI  [name] is a 21 y.o. [phone] who presents to MAU today with complaint of vaginal bleeding, lower abdominal and back pain and headache. The patient had D&E for a missed AB in January. She states bleeding x 2 days after the procedure and then none until tonight. She has had multiple +HPTs since the procedure. She was started on Depo provera 2-3 weeks ago. She states that she has been followed in the office with hCG levels that are trending down and are believed to be due to her recent pregnancy and not a new pregnancy. She states that bleeding started tonight. She has used 1 pad. She states that bleeding is lighter now than at onset. She states mild low back and pelvic pain. She denies UTI symptoms or vaginal discharge. She is sexually active and does not use condoms.   OB History    Gravida Para Term Preterm AB Living   3 1 0 1 2 1    SAB TAB Ectopic Multiple Live Births   2 0 0 0 1      Past Medical History:  Diagnosis Date  . Anxiety   . Constipation   . Depression   . Vision abnormalities     Past Surgical History:  Procedure Laterality Date  . DILATION AND EVACUATION N/A 04/21/2016   Procedure: DILATATION AND EVACUATION;  Surgeon: Lavina Hamman, MD;  Location: [facility];  Service: Gynecology;  Laterality: N/A;  . NO PAST SURGERIES    . WISDOM TOOTH EXTRACTION      Family History  Problem Relation Age of Onset  . Depression Mother     Social History  Substance Use Topics  . Smoking status: Never Smoker  . Smokeless tobacco: Never Used  . Alcohol use No    Allergies: No Known Allergies  Prescriptions Prior to Admission  Medication Sig Dispense Refill Last Dose  . acetaminophen (TYLENOL) 500 MG tablet Take 1,000 mg by mouth every 6 (six) hours as  needed for headache.   Past Month at Unknown time    Review of Systems  Constitutional: Negative for fever.  Gastrointestinal: Positive for abdominal pain. Negative for constipation, diarrhea, nausea and vomiting.  Genitourinary: Positive for pelvic pain and vaginal bleeding. Negative for dysuria, frequency, urgency and vaginal discharge.   Physical Exam   Blood pressure (!) 96/48, pulse 87, temperature 98.7 F (37.1 C), resp. rate 18, SpO2 100 %, unknown if currently breastfeeding.  Physical Exam  Nursing note and vitals reviewed. Constitutional: She is oriented to person, place, and time. She appears well-developed and well-nourished. No distress.  HENT:  Head: Normocephalic and atraumatic.  Cardiovascular: Normal rate.   Respiratory: Effort normal.  GI: Soft. She exhibits no distension and no mass. There is tenderness (mild tenderness to palpation of the lower abdomen bilateral). There is no rebound and no guarding.  Neurological: She is alert and oriented to person, place, and time.  Skin: Skin is warm and dry. No erythema.  Psychiatric: She has a normal mood and affect.    Results for orders placed or performed during the hospital encounter of 06/11/16 (from the past 24 hour(s))  Urinalysis, Routine w reflex microscopic     Status: Abnormal   Collection Time: 06/11/16  12:35 AM  Result Value Ref Range   Color, Urine YELLOW YELLOW   APPearance HAZY (A) CLEAR   Specific Gravity, Urine 1.031 (H) 1.005 - 1.030   pH 5.0 5.0 - 8.0   Glucose, UA NEGATIVE NEGATIVE mg/dL   Hgb urine dipstick SMALL (A) NEGATIVE   Bilirubin Urine MODERATE (A) NEGATIVE   Ketones, ur 5 (A) NEGATIVE mg/dL   Protein, ur 30 (A) NEGATIVE mg/dL   Nitrite NEGATIVE NEGATIVE   Leukocytes, UA NEGATIVE NEGATIVE   RBC / HPF 0-5 0 - 5 RBC/hpf   WBC, UA 0-5 0 - 5 WBC/hpf   Bacteria, UA RARE (A) NONE SEEN   Squamous Epithelial / LPF 0-5 (A) NONE SEEN   Mucous PRESENT   Pregnancy, urine POC     Status: None    Collection Time: 06/11/16 12:44 AM  Result Value Ref Range   Preg Test, Ur NEGATIVE NEGATIVE    MAU Course  Procedures None  MDM UPT - negative  disucssed patient with Dr. Jackelyn KnifeMeisinger. Patient has been followed for hCG in the office and it is trending downward. No need for hCG today in MAU. Follow-up in the office.   Assessment and Plan  A: Recent missed AB s/p D&E Vaginal bleeding Dysmenorrhea   P: Discharge home Ibuprofen PRN for pain advised Bleeding precautions discussed Patient advised [facility] as scheduled for routine GYN care or sooner PRN Patient may return to MAU as needed or if her condition were to change or worsen   Marny LowensteinJulie N Desiree Daise, PA-C  06/11/2016, 12:57 AM

## 2016-07-20 ENCOUNTER — Encounter (HOSPITAL_COMMUNITY): Payer: Self-pay

## 2016-07-20 ENCOUNTER — Emergency Department (HOSPITAL_COMMUNITY)
Admission: EM | Admit: 2016-07-20 | Discharge: 2016-07-20 | Disposition: A | Discharge status: Home / Self Care | Payer: BLUE CROSS/BLUE SHIELD | Attending: Emergency Medicine | Admitting: Emergency Medicine

## 2016-07-20 DIAGNOSIS — R51 Headache: Secondary | ICD-10-CM | POA: Diagnosis present

## 2016-07-20 DIAGNOSIS — Z87898 Personal history of other specified conditions: Secondary | ICD-10-CM

## 2016-07-20 DIAGNOSIS — R1084 Generalized abdominal pain: Secondary | ICD-10-CM | POA: Insufficient documentation

## 2016-07-20 DIAGNOSIS — G44209 Tension-type headache, unspecified, not intractable: Secondary | ICD-10-CM | POA: Insufficient documentation

## 2016-07-20 DIAGNOSIS — R109 Unspecified abdominal pain: Secondary | ICD-10-CM

## 2016-07-20 DIAGNOSIS — Z8709 Personal history of other diseases of the respiratory system: Secondary | ICD-10-CM | POA: Insufficient documentation

## 2016-07-20 DIAGNOSIS — G8929 Other chronic pain: Secondary | ICD-10-CM | POA: Insufficient documentation

## 2016-07-20 LAB — COMPREHENSIVE METABOLIC PANEL
ALBUMIN: 3.9 g/dL (ref 3.5–5.0)
ALK PHOS: 39 U/L (ref 38–126)
ALT: 11 U/L — ABNORMAL LOW (ref 14–54)
AST: 18 U/L (ref 15–41)
Anion gap: 8 (ref 5–15)
BILIRUBIN TOTAL: 0.7 mg/dL (ref 0.3–1.2)
BUN: 8 mg/dL (ref 6–20)
CALCIUM: 9.3 mg/dL (ref 8.9–10.3)
CO2: 24 mmol/L (ref 22–32)
Chloride: 107 mmol/L (ref 101–111)
Creatinine, Ser: 0.67 mg/dL (ref 0.44–1.00)
GFR calc Af Amer: >60 mL/min (ref >60)
GLUCOSE: 105 mg/dL — AB (ref 65–99)
POTASSIUM: 4.4 mmol/L (ref 3.5–5.1)
Sodium: 139 mmol/L (ref 135–145)
TOTAL PROTEIN: 6.9 g/dL (ref 6.5–8.1)

## 2016-07-20 LAB — LIPASE, BLOOD: Lipase: 12 U/L (ref 11–51)

## 2016-07-20 LAB — URINALYSIS, ROUTINE W REFLEX MICROSCOPIC
Bilirubin Urine: NEGATIVE
Glucose, UA: NEGATIVE mg/dL
Hgb urine dipstick: NEGATIVE
Ketones, ur: NEGATIVE mg/dL
Leukocytes, UA: NEGATIVE
Nitrite: NEGATIVE
Protein, ur: NEGATIVE mg/dL
Specific Gravity, Urine: 1.016 (ref 1.005–1.030)
pH: 6 (ref 5.0–8.0)

## 2016-07-20 LAB — CBC
HCT: 39.2 % (ref 36.0–46.0)
Hemoglobin: 12.5 g/dL (ref 12.0–15.0)
MCH: 26.8 pg (ref 26.0–34.0)
MCHC: 31.9 g/dL (ref 30.0–36.0)
MCV: 84.1 fL (ref 78.0–100.0)
Platelets: 262 K/uL (ref 150–400)
RBC: 4.66 MIL/uL (ref 3.87–5.11)
RDW: 14 % (ref 11.5–15.5)
WBC: 5.1 K/uL (ref 4.0–10.5)

## 2016-07-20 LAB — I-STAT BETA HCG BLOOD, ED (MC, WL, AP ONLY): HCG, QUANTITATIVE: 5.5 m[IU]/mL — AB (ref <5)
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
## 2016-07-20 NOTE — ED Triage Notes (Signed)
Pt here with multiple symptoms: headache, abdominal pain, back pain, chills, subjective fevers and "random" nose bleeds; ongoing intermittently since January. Hx of recurrent headaches.

## 2016-08-19 ENCOUNTER — Emergency Department (HOSPITAL_COMMUNITY): Payer: BLUE CROSS/BLUE SHIELD

## 2016-08-19 ENCOUNTER — Encounter (HOSPITAL_COMMUNITY): Payer: Self-pay | Admitting: Emergency Medicine

## 2016-08-19 ENCOUNTER — Emergency Department (HOSPITAL_COMMUNITY)
Admission: EM | Admit: 2016-08-19 | Discharge: 2016-08-19 | Disposition: A | Discharge status: Home / Self Care | Payer: BLUE CROSS/BLUE SHIELD | Attending: Emergency Medicine | Admitting: Emergency Medicine

## 2016-08-19 DIAGNOSIS — Y92410 Unspecified street and highway as the place of occurrence of the external cause: Secondary | ICD-10-CM | POA: Diagnosis not present

## 2016-08-19 DIAGNOSIS — R51 Headache: Secondary | ICD-10-CM | POA: Insufficient documentation

## 2016-08-19 DIAGNOSIS — Y939 Activity, unspecified: Secondary | ICD-10-CM | POA: Diagnosis not present

## 2016-08-19 DIAGNOSIS — Y999 Unspecified external cause status: Secondary | ICD-10-CM | POA: Diagnosis not present

## 2016-08-19 DIAGNOSIS — S199XXA Unspecified injury of neck, initial encounter: Secondary | ICD-10-CM | POA: Diagnosis present

## 2016-08-19 DIAGNOSIS — S161XXA Strain of muscle, fascia and tendon at neck level, initial encounter: Secondary | ICD-10-CM | POA: Diagnosis not present

## 2016-08-19 LAB — PREGNANCY, URINE: Preg Test, Ur: NEGATIVE

## 2016-08-19 MED ORDER — CYCLOBENZAPRINE HCL 10 MG PO TABS: 10.0000 mg | ORAL_TABLET | Freq: Two times a day (BID) | ORAL | 0 refills | Status: DC | PRN

## 2016-08-19 MED ORDER — IBUPROFEN 600 MG PO TABS: 600.0000 mg | ORAL_TABLET | Freq: Four times a day (QID) | ORAL | 0 refills | Status: DC | PRN

## 2016-08-19 MED ORDER — IBUPROFEN 200 MG PO TABS
600.0000 mg | ORAL_TABLET | Freq: Once | ORAL | Status: AC
  Administered 2016-08-19: 600 mg via ORAL
  Filled 2016-08-19: qty 3
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
## 2016-08-30 ENCOUNTER — Ambulatory Visit (INDEPENDENT_AMBULATORY_CARE_PROVIDER_SITE_OTHER): Payer: BLUE CROSS/BLUE SHIELD | Admitting: Physician Assistant

## 2016-08-30 ENCOUNTER — Other Ambulatory Visit (INDEPENDENT_AMBULATORY_CARE_PROVIDER_SITE_OTHER): Payer: Self-pay

## 2016-08-30 DIAGNOSIS — M542 Cervicalgia: Secondary | ICD-10-CM

## 2016-08-30 DIAGNOSIS — M545 Low back pain: Secondary | ICD-10-CM

## 2016-08-30 MED ORDER — METHYLPREDNISOLONE 4 MG PO TABS: ORAL_TABLET | ORAL | 0 refills | Status: DC
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
## 2016-09-07 ENCOUNTER — Encounter: Payer: Self-pay | Admitting: Physical Therapy

## 2016-09-07 ENCOUNTER — Ambulatory Visit: Payer: Medicaid Other | Attending: Physician Assistant | Admitting: Physical Therapy

## 2016-09-07 DIAGNOSIS — M542 Cervicalgia: Secondary | ICD-10-CM | POA: Insufficient documentation

## 2016-09-07 DIAGNOSIS — M545 Low back pain, unspecified: Secondary | ICD-10-CM

## 2016-09-07 DIAGNOSIS — M6281 Muscle weakness (generalized): Secondary | ICD-10-CM | POA: Insufficient documentation

## 2016-09-07 DIAGNOSIS — R293 Abnormal posture: Secondary | ICD-10-CM | POA: Insufficient documentation
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
## 2016-09-09 ENCOUNTER — Encounter: Payer: Self-pay | Admitting: Physical Therapy

## 2016-09-13 ENCOUNTER — Encounter: Payer: Self-pay | Admitting: Physical Therapy

## 2016-09-13 ENCOUNTER — Ambulatory Visit: Payer: Medicaid Other | Attending: Physician Assistant | Admitting: Physical Therapy

## 2016-09-13 DIAGNOSIS — R293 Abnormal posture: Secondary | ICD-10-CM | POA: Diagnosis present

## 2016-09-13 DIAGNOSIS — M542 Cervicalgia: Secondary | ICD-10-CM

## 2016-09-13 DIAGNOSIS — M6281 Muscle weakness (generalized): Secondary | ICD-10-CM

## 2016-09-13 DIAGNOSIS — M545 Low back pain, unspecified: Secondary | ICD-10-CM
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
## 2016-09-13 NOTE — Patient Instructions (Signed)
Supine clam  Supine chin tuck  Given from cabinet

## 2016-09-20 ENCOUNTER — Ambulatory Visit: Payer: Medicaid Other | Admitting: Physical Therapy

## 2016-09-22 ENCOUNTER — Ambulatory Visit: Payer: Medicaid Other | Admitting: Physical Therapy

## 2016-09-22 ENCOUNTER — Encounter: Payer: Self-pay | Admitting: Physical Therapy

## 2016-09-22 DIAGNOSIS — M545 Low back pain, unspecified: Secondary | ICD-10-CM

## 2016-09-22 DIAGNOSIS — R293 Abnormal posture: Secondary | ICD-10-CM

## 2016-09-22 DIAGNOSIS — M542 Cervicalgia: Secondary | ICD-10-CM | POA: Diagnosis not present

## 2016-09-22 DIAGNOSIS — M6281 Muscle weakness (generalized): Secondary | ICD-10-CM
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
## 2016-09-27 ENCOUNTER — Encounter: Payer: Self-pay | Admitting: Physical Therapy

## 2016-09-27 ENCOUNTER — Ambulatory Visit: Payer: Medicaid Other | Admitting: Physical Therapy

## 2016-09-29 ENCOUNTER — Ambulatory Visit: Payer: Medicaid Other | Admitting: Physical Therapy

## 2016-09-29 ENCOUNTER — Encounter: Payer: Self-pay | Admitting: Physical Therapy

## 2016-09-29 DIAGNOSIS — R293 Abnormal posture: Secondary | ICD-10-CM

## 2016-09-29 DIAGNOSIS — M545 Low back pain, unspecified: Secondary | ICD-10-CM

## 2016-09-29 DIAGNOSIS — M542 Cervicalgia: Secondary | ICD-10-CM

## 2016-09-29 DIAGNOSIS — M6281 Muscle weakness (generalized): Secondary | ICD-10-CM

## 2016-09-29 NOTE — Patient Instructions (Addendum)
Over Head Pull: Narrow Grip       On back, knees bent, feet flat, band across thighs, elbows straight but relaxed. Pull hands apart (start). Keeping elbows straight, bring arms up and over head, hands toward floor. Keep pull steady on band. Hold momentarily. Return slowly, keeping pull steady, back to start. Repeat _10__ times. Band color __Red____   Side Pull: Double Arm   On back, knees bent, feet flat. Arms perpendicular to body, shoulder level, elbows straight but relaxed. Pull arms out to sides, elbows straight. Resistance band comes across collarbones, hands toward floor. Hold momentarily. Slowly return to starting position. Repeat __10_ times. Band color ___Red__   Elisabeth CaraSash   On back, knees bent, feet flat, left hand on left hip, right hand above left. Pull right arm DIAGONALLY (hip to shoulder) across chest. Bring right arm along head toward floor. Hold momentarily. Slowly return to starting position. Repeat __10_ times. Do with left arm. Band color __RED____   Shoulder Rotation: Double Arm   On back, knees bent, feet flat, elbows tucked at sides, bent 90, hands palms up. Pull hands apart and down toward floor, keeping elbows near sides. Hold momentarily. Slowly return to starting position. Repeat __10-20_ times. Band color ____RED _    YOU CAN ALSO DO THESE EXERCISES in standing or against a wall
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
## 2016-10-04 ENCOUNTER — Ambulatory Visit: Payer: Medicaid Other | Admitting: Physical Therapy

## 2016-10-04 DIAGNOSIS — M6281 Muscle weakness (generalized): Secondary | ICD-10-CM

## 2016-10-04 DIAGNOSIS — M542 Cervicalgia: Secondary | ICD-10-CM | POA: Diagnosis not present

## 2016-10-04 DIAGNOSIS — M545 Low back pain, unspecified: Secondary | ICD-10-CM

## 2016-10-04 DIAGNOSIS — R293 Abnormal posture: Secondary | ICD-10-CM
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
## 2016-10-06 ENCOUNTER — Ambulatory Visit: Payer: Medicaid Other | Admitting: Physical Therapy

## 2016-10-06 DIAGNOSIS — R293 Abnormal posture: Secondary | ICD-10-CM

## 2016-10-06 DIAGNOSIS — M542 Cervicalgia: Secondary | ICD-10-CM

## 2016-10-06 DIAGNOSIS — M6281 Muscle weakness (generalized): Secondary | ICD-10-CM

## 2016-10-06 DIAGNOSIS — M545 Low back pain, unspecified: Secondary | ICD-10-CM
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
## 2016-10-18 ENCOUNTER — Ambulatory Visit: Payer: Medicaid Other | Attending: Physician Assistant | Admitting: Physical Therapy

## 2016-10-20 ENCOUNTER — Ambulatory Visit: Payer: Medicaid Other | Admitting: Physical Therapy

## 2016-10-20 ENCOUNTER — Telehealth: Payer: Self-pay | Admitting: Physical Therapy

## 2016-10-20 NOTE — Telephone Encounter (Signed)
Left message regarding two no-show appointments this week. Reminded her of next appointment and canceled all future appointments afterward. She will need to make more appointments, one at a time, if she returns to PT.

## 2016-10-25 ENCOUNTER — Ambulatory Visit: Payer: Medicaid Other | Admitting: Physical Therapy

## 2016-10-28 ENCOUNTER — Encounter: Payer: Self-pay | Admitting: Physical Therapy

## 2016-11-01 ENCOUNTER — Encounter: Payer: Self-pay | Admitting: Physical Therapy

## 2016-11-04 ENCOUNTER — Encounter: Payer: Self-pay | Admitting: Physical Therapy

## 2016-12-06 ENCOUNTER — Encounter (HOSPITAL_COMMUNITY): Payer: Self-pay | Admitting: *Deleted

## 2016-12-06 ENCOUNTER — Inpatient Hospital Stay (HOSPITAL_COMMUNITY)
Admission: AD | Admit: 2016-12-06 | Discharge: 2016-12-06 | Disposition: A | Discharge status: Home / Self Care | Payer: Medicaid Other | Source: Ambulatory Visit | Attending: Obstetrics and Gynecology | Admitting: Obstetrics and Gynecology

## 2016-12-06 DIAGNOSIS — N920 Excessive and frequent menstruation with regular cycle: Secondary | ICD-10-CM | POA: Diagnosis present

## 2016-12-06 DIAGNOSIS — N760 Acute vaginitis: Secondary | ICD-10-CM

## 2016-12-06 DIAGNOSIS — R102 Pelvic and perineal pain: Secondary | ICD-10-CM | POA: Diagnosis not present

## 2016-12-06 DIAGNOSIS — B9689 Other specified bacterial agents as the cause of diseases classified elsewhere: Secondary | ICD-10-CM

## 2016-12-06 DIAGNOSIS — N939 Abnormal uterine and vaginal bleeding, unspecified: Secondary | ICD-10-CM

## 2016-12-06 LAB — CBC
HCT: 36.8 % (ref 36.0–46.0)
Hemoglobin: 12 g/dL (ref 12.0–15.0)
MCH: 26.1 pg (ref 26.0–34.0)
MCHC: 32.6 g/dL (ref 30.0–36.0)
MCV: 80 fL (ref 78.0–100.0)
PLATELETS: 230 10*3/uL (ref 150–400)
RBC: 4.6 MIL/uL (ref 3.87–5.11)
RDW: 14.8 % (ref 11.5–15.5)
WBC: 6 10*3/uL (ref 4.0–10.5)

## 2016-12-06 LAB — URINALYSIS, ROUTINE W REFLEX MICROSCOPIC
Bilirubin Urine: NEGATIVE
Glucose, UA: NEGATIVE mg/dL
Hgb urine dipstick: NEGATIVE
Ketones, ur: NEGATIVE mg/dL
Leukocytes, UA: NEGATIVE
NITRITE: NEGATIVE
PH: 6 (ref 5.0–8.0)
Protein, ur: NEGATIVE mg/dL
Specific Gravity, Urine: 1.021 (ref 1.005–1.030)

## 2016-12-06 LAB — WET PREP, GENITAL
SPERM: NONE SEEN
Trich, Wet Prep: NONE SEEN
YEAST WET PREP: NONE SEEN

## 2016-12-06 LAB — POCT PREGNANCY, URINE: PREG TEST UR: NEGATIVE

## 2016-12-06 MED ORDER — CEFTRIAXONE SODIUM 250 MG IJ SOLR
250.0000 mg | INTRAMUSCULAR | Status: AC
  Administered 2016-12-06: 250 mg via INTRAMUSCULAR
  Filled 2016-12-06: qty 250

## 2016-12-06 MED ORDER — IBUPROFEN 600 MG PO TABS: 600.0000 mg | ORAL_TABLET | Freq: Four times a day (QID) | ORAL | 0 refills | Status: DC | PRN

## 2016-12-06 MED ORDER — DOXYCYCLINE HYCLATE 100 MG PO CAPS: 100.0000 mg | ORAL_CAPSULE | Freq: Two times a day (BID) | ORAL | 0 refills | Status: DC

## 2016-12-06 MED ORDER — AZITHROMYCIN 250 MG PO TABS
1000.0000 mg | ORAL_TABLET | Freq: Once | ORAL | Status: AC
  Administered 2016-12-06: 1000 mg via ORAL
  Filled 2016-12-06: qty 4

## 2016-12-06 MED ORDER — AZITHROMYCIN 500 MG PO TABS: ORAL_TABLET | ORAL | 0 refills | Status: DC

## 2016-12-06 MED ORDER — AZITHROMYCIN 250 MG PO TABS
1000.0000 mg | ORAL_TABLET | Freq: Once | ORAL | Status: DC
Start: 2016-12-06 — End: 2016-12-06

## 2016-12-06 MED ORDER — METRONIDAZOLE 500 MG PO TABS: 500.0000 mg | ORAL_TABLET | Freq: Two times a day (BID) | ORAL | 0 refills | Status: DC

## 2016-12-06 NOTE — MAU Note (Signed)
Pt presents to MAU with complaints of vaginal spotting, lower abdominal cramping, and waking up in cold sweats since last week.

## 2016-12-06 NOTE — MAU Provider Note (Signed)
Chief Complaint: Vaginal Bleeding and Abdominal Pain   None     SUBJECTIVE HPI: Haley Lopez is a 20 y.o. [id number] who presents to maternity admissions reporting daily spotting, abdominal cramping, and night sweats x 1 week.  She reports her periods have been regular since her last Depo injection 7 months ago. She is not currently using any hormonal contraception.  She denies any respiratory symptoms or cough. She denies any dysuria or frequency. She has no sick contacts.  She has not taken anything for her pain or spotting, nothing makes them better or worse. She denies vaginal itching/burning, urinary symptoms, h/a, dizziness, n/v, or fever/chills.     HPI  Past Medical History:  Diagnosis Date  . Anxiety   . Constipation   . Depression   . Vision abnormalities    Past Surgical History:  Procedure Laterality Date  . DILATION AND EVACUATION N/A 04/21/2016   Procedure: DILATATION AND EVACUATION;  Surgeon: Lavina Hamman, MD;  Location: [facility];  Service: Gynecology;  Laterality: N/A;  . NO PAST SURGERIES    . WISDOM TOOTH EXTRACTION     Social History   Social History  . Marital status: Single    Spouse name: N/A  . Number of children: N/A  . Years of education: N/A   Occupational History  . Not on file.   Social History Main Topics  . Smoking status: Never Smoker  . Smokeless tobacco: Never Used  . Alcohol use No  . Drug use: Yes    Types: Marijuana     Comment: 3 weeks ago  . Sexual activity: Yes    Birth control/ protection: Injection   Other Topics Concern  . Not on file   Social History Narrative  . No narrative on file   No current facility-administered medications on file prior to encounter.    Current Outpatient Prescriptions on File Prior to Encounter  Medication Sig Dispense Refill  . cyclobenzaprine (FLEXERIL) 10 MG tablet Take 1 tablet (10 mg total) by mouth 2 (two) times daily as needed for muscle spasms. (Patient not taking: Reported on 12/06/2016) 20  tablet 0  . methylPREDNISolone (MEDROL) 4 MG tablet Take as directed (Patient not taking: Reported on 12/06/2016) 21 tablet 0   No Known Allergies  ROS:  Review of Systems  Constitutional: Positive for diaphoresis. Negative for chills, fatigue and fever.  Respiratory: Negative for shortness of breath.   Cardiovascular: Negative for chest pain.  Gastrointestinal: Negative for nausea and vomiting.  Genitourinary: Positive for pelvic pain and vaginal bleeding. Negative for difficulty urinating, dysuria, flank pain, vaginal discharge and vaginal pain.  Neurological: Negative for dizziness and headaches.  Psychiatric/Behavioral: Negative.      I have reviewed patient's Past Medical Hx, Surgical Hx, Family Hx, Social Hx, medications and allergies.   Physical Exam   Patient Vitals for the past 24 hrs:  BP Temp Pulse Resp Height Weight  12/06/16 0926 118/75 99 F (37.2 C) 81 18 5\' 2"  (1.575 m) 99 lb (44.9 kg)   Constitutional: Well-developed, well-nourished female in no acute distress.  Cardiovascular: normal rate Respiratory: normal effort GI: Abd soft, non-tender. Pos BS x 4 MS: Extremities nontender, no edema, normal ROM Neurologic: Alert and oriented x 4.  GU: Neg CVAT.  PELVIC EXAM: Cervix pink, visually closed, without lesion, small amount light brown slightly frothy discharge, vaginal walls and external genitalia normal Bimanual exam: Cervix 0/long/high, firm, anterior, neg CMT, uterus nontender, nonenlarged, adnexa without tenderness, enlargement, or mass  LAB RESULTS Results for orders placed or performed during the hospital encounter of 12/06/16 (from the past 24 hour(s))  Urinalysis, Routine w reflex microscopic     Status: None   Collection Time: 12/06/16  9:16 AM  Result Value Ref Range   Color, Urine YELLOW YELLOW   APPearance CLEAR CLEAR   Specific Gravity, Urine 1.021 1.005 - 1.030   pH 6.0 5.0 - 8.0   Glucose, UA NEGATIVE NEGATIVE mg/dL   Hgb urine dipstick  NEGATIVE NEGATIVE   Bilirubin Urine NEGATIVE NEGATIVE   Ketones, ur NEGATIVE NEGATIVE mg/dL   Protein, ur NEGATIVE NEGATIVE mg/dL   Nitrite NEGATIVE NEGATIVE   Leukocytes, UA NEGATIVE NEGATIVE  Pregnancy, urine POC     Status: None   Collection Time: 12/06/16  9:22 AM  Result Value Ref Range   Preg Test, Ur NEGATIVE NEGATIVE  CBC     Status: None   Collection Time: 12/06/16 10:22 AM  Result Value Ref Range   WBC 6.0 4.0 - 10.5 K/uL   RBC 4.60 3.87 - 5.11 MIL/uL   Hemoglobin 12.0 12.0 - 15.0 g/dL   HCT 46.8 03.2 - 12.2 %   MCV 80.0 78.0 - 100.0 fL   MCH 26.1 26.0 - 34.0 pg   MCHC 32.6 30.0 - 36.0 g/dL   RDW 48.2 50.0 - 37.0 %   Platelets 230 150 - 400 K/uL  Wet prep, genital     Status: Abnormal   Collection Time: 12/06/16 10:45 AM  Result Value Ref Range   Yeast Wet Prep HPF POC NONE SEEN NONE SEEN   Trich, Wet Prep NONE SEEN NONE SEEN   Clue Cells Wet Prep HPF POC PRESENT (A) NONE SEEN   WBC, Wet Prep HPF POC MODERATE (A) NONE SEEN   Sperm NONE SEEN        IMAGING No results found.  MAU Management/MDM: Ordered labs and reviewed results.  No evidence of acute abdomen.  WBCs wnl.  Pt with low grade temp and night sweats with pelvic pain. Consult [facility] with assessment and findings. Offered treatment for possible PID. Pt agreed so Rocephin 250 mg IM x 1 dose today and doxycycline 100 mg BID x 2 weeks.   Pt to f/u in office and/or with primary care if symptoms persist.  Pt stable at time of discharge.  ASSESSMENT 1. Acute pelvic pain, female   2. Vaginal spotting   3. BV (bacterial vaginosis)     PLAN Discharge home Allergies as of 12/06/2016   No Known Allergies     Medication List    TAKE these medications   cyclobenzaprine 10 MG tablet Commonly known as:  FLEXERIL Take 1 tablet (10 mg total) by mouth 2 (two) times daily as needed for muscle spasms.   doxycycline 100 MG capsule Commonly known as:  VIBRAMYCIN Take 1 capsule (100 mg total) by mouth 2 (two)  times daily.   ibuprofen 600 MG tablet Commonly known as:  ADVIL,MOTRIN Take 1 tablet (600 mg total) by mouth every 6 (six) hours as needed.   methylPREDNISolone 4 MG tablet Commonly known as:  MEDROL Take as directed   metroNIDAZOLE 500 MG tablet Commonly known as:  FLAGYL Take 1 tablet (500 mg total) by mouth 2 (two) times daily.   mirtazapine 30 MG tablet Commonly known as:  REMERON Take 30 mg by mouth.   naproxen sodium 220 MG tablet Commonly known as:  ANAPROX Take 220 mg by mouth 2 (two) times daily with a meal.  For pain            Discharge Care Instructions        Start     Ordered   12/06/16 0000  ibuprofen (ADVIL,MOTRIN) 600 MG tablet  Every 6 hours PRN    Question:  Supervising Provider  Answer:  Levie Heritage   12/06/16 1206   12/06/16 0000  Discharge patient    Question Answer Comment  Discharge disposition 01-Home or Self Care   Discharge patient date 12/06/2016      12/06/16 1206   12/06/16 0000  doxycycline (VIBRAMYCIN) 100 MG capsule  2 times daily    Question:  Supervising Provider  Answer:  Levie Heritage   12/06/16 1208   12/06/16 0000  metroNIDAZOLE (FLAGYL) 500 MG tablet  2 times daily    Question:  Supervising Provider  Answer:  Levie Heritage   12/06/16 1208     Follow-up Information    Huel Cote, MD Follow up.   Specialty:  Obstetrics and Gynecology Why:  If symptoms worsen or persist, return to MAU as needed for emergencies. Contact information: [address] [place] [address] [phone]           Sharen Counter Certified Nurse-Midwife 12/06/2016  12:09 PM

## 2016-12-07 LAB — GC/CHLAMYDIA PROBE AMP (~~LOC~~) NOT AT ARMC
Chlamydia: POSITIVE — AB
Neisseria Gonorrhea: NEGATIVE

## 2016-12-20 ENCOUNTER — Telehealth (HOSPITAL_COMMUNITY): Payer: Self-pay | Admitting: *Deleted

## 2017-02-01 DIAGNOSIS — F419 Anxiety disorder, unspecified: Secondary | ICD-10-CM | POA: Insufficient documentation

## 2017-05-01 ENCOUNTER — Emergency Department (HOSPITAL_COMMUNITY)
Admission: EM | Admit: 2017-05-01 | Discharge: 2017-05-01 | Disposition: A | Discharge status: Home / Self Care | Payer: Medicaid Other | Attending: Emergency Medicine | Admitting: Emergency Medicine

## 2017-05-01 ENCOUNTER — Encounter (HOSPITAL_COMMUNITY): Payer: Self-pay | Admitting: Emergency Medicine

## 2017-05-01 ENCOUNTER — Other Ambulatory Visit: Payer: Self-pay

## 2017-05-01 DIAGNOSIS — R102 Pelvic and perineal pain: Secondary | ICD-10-CM | POA: Insufficient documentation

## 2017-05-01 LAB — URINALYSIS, ROUTINE W REFLEX MICROSCOPIC
Bilirubin Urine: NEGATIVE
GLUCOSE, UA: NEGATIVE mg/dL
Hgb urine dipstick: NEGATIVE
Ketones, ur: NEGATIVE mg/dL
Leukocytes, UA: NEGATIVE
Nitrite: NEGATIVE
Protein, ur: NEGATIVE mg/dL
SPECIFIC GRAVITY, URINE: 1.018 (ref 1.005–1.030)
pH: 6 (ref 5.0–8.0)

## 2017-05-01 LAB — COMPREHENSIVE METABOLIC PANEL
ALBUMIN: 3.8 g/dL (ref 3.5–5.0)
ALT: 16 U/L (ref 14–54)
AST: 21 U/L (ref 15–41)
Alkaline Phosphatase: 57 U/L (ref 38–126)
Anion gap: 8 (ref 5–15)
BUN: 8 mg/dL (ref 6–20)
CHLORIDE: 104 mmol/L (ref 101–111)
CO2: 25 mmol/L (ref 22–32)
CREATININE: 0.72 mg/dL (ref 0.44–1.00)
Calcium: 9.6 mg/dL (ref 8.9–10.3)
GFR calc Af Amer: >60 mL/min (ref >60)
GFR calc non Af Amer: >60 mL/min (ref >60)
GLUCOSE: 106 mg/dL — AB (ref 65–99)
Potassium: 4.4 mmol/L (ref 3.5–5.1)
SODIUM: 137 mmol/L (ref 135–145)
Total Bilirubin: 0.6 mg/dL (ref 0.3–1.2)
Total Protein: 7.3 g/dL (ref 6.5–8.1)

## 2017-05-01 LAB — LIPASE, BLOOD: LIPASE: 26 U/L (ref 11–51)

## 2017-05-01 LAB — CBC
HCT: 40.9 % (ref 36.0–46.0)
HEMOGLOBIN: 13.3 g/dL (ref 12.0–15.0)
MCH: 27.4 pg (ref 26.0–34.0)
MCHC: 32.5 g/dL (ref 30.0–36.0)
MCV: 84.2 fL (ref 78.0–100.0)
Platelets: 263 10*3/uL (ref 150–400)
RBC: 4.86 MIL/uL (ref 3.87–5.11)
RDW: 13.6 % (ref 11.5–15.5)
WBC: 7.2 10*3/uL (ref 4.0–10.5)

## 2017-05-01 LAB — I-STAT BETA HCG BLOOD, ED (MC, WL, AP ONLY): I-stat hCG, quantitative: <5 m[IU]/mL (ref <5)
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
## 2017-05-01 NOTE — ED Notes (Signed)
Patient presents to ed c/o abd. Pain onset 12/29 after her menstrual period. states she hasn't started her period this month. C/o lower abd. Cramping off and on.

## 2017-05-01 NOTE — Discharge Instructions (Signed)
You were evaluated in the emergency department for lower abdominal pain.  Your pregnancy test was negative and urinalysis did not show any signs of infection.  The rest of your blood counts also were unremarkable.  We performed a pelvic exam and be sent off test to look for gonorrhea chlamydia.  We discussed antibiotics and you wanted to wait until the results of the tests were back.  You should follow-up with your doctor and return if any worsening symptoms.  Please continue anti-inflammatories like ibuprofen.

## 2017-05-01 NOTE — ED Notes (Signed)
Pelvic cart ready at bedside. Pt getting undressed at this time

## 2017-05-01 NOTE — ED Triage Notes (Signed)
Pt 22 y o female coming from home, c.o. Lower abdominal pain to both left and right. Denies vaginal bleeding// discharge. Pt is late for this months period. States some diarrhea, some nausea. "my stomach has hurt since December 29th, Pt states she could be pregnant, hx of4 pregnancies 1 full term.

## 2017-05-02 LAB — GC/CHLAMYDIA PROBE AMP (~~LOC~~) NOT AT ARMC
CHLAMYDIA, DNA PROBE: NEGATIVE
Neisseria Gonorrhea: NEGATIVE
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
## 2018-01-17 ENCOUNTER — Inpatient Hospital Stay (HOSPITAL_COMMUNITY): Payer: Medicaid Other

## 2018-01-17 ENCOUNTER — Encounter (HOSPITAL_COMMUNITY): Payer: Self-pay

## 2018-01-17 ENCOUNTER — Other Ambulatory Visit: Payer: Self-pay

## 2018-01-17 ENCOUNTER — Inpatient Hospital Stay (HOSPITAL_COMMUNITY)
Admission: AD | Admit: 2018-01-17 | Discharge: 2018-01-17 | Disposition: A | Discharge status: Home / Self Care | Payer: Medicaid Other | Source: Ambulatory Visit | Attending: Obstetrics & Gynecology | Admitting: Obstetrics & Gynecology

## 2018-01-17 DIAGNOSIS — O26891 Other specified pregnancy related conditions, first trimester: Secondary | ICD-10-CM

## 2018-01-17 DIAGNOSIS — Z3A37 37 weeks gestation of pregnancy: Secondary | ICD-10-CM | POA: Diagnosis not present

## 2018-01-17 DIAGNOSIS — Z3490 Encounter for supervision of normal pregnancy, unspecified, unspecified trimester: Secondary | ICD-10-CM

## 2018-01-17 DIAGNOSIS — Z7982 Long term (current) use of aspirin: Secondary | ICD-10-CM | POA: Insufficient documentation

## 2018-01-17 DIAGNOSIS — O26893 Other specified pregnancy related conditions, third trimester: Secondary | ICD-10-CM | POA: Insufficient documentation

## 2018-01-17 DIAGNOSIS — R102 Pelvic and perineal pain: Secondary | ICD-10-CM | POA: Diagnosis not present

## 2018-01-17 DIAGNOSIS — R109 Unspecified abdominal pain: Secondary | ICD-10-CM | POA: Diagnosis present

## 2018-01-17 DIAGNOSIS — O26899 Other specified pregnancy related conditions, unspecified trimester: Secondary | ICD-10-CM

## 2018-01-17 DIAGNOSIS — O3680X0 Pregnancy with inconclusive fetal viability, not applicable or unspecified: Secondary | ICD-10-CM

## 2018-01-17 LAB — HCG, QUANTITATIVE, PREGNANCY: hCG, Beta Chain, Quant, S: 1077 m[IU]/mL — ABNORMAL HIGH (ref <5)

## 2018-01-17 LAB — URINALYSIS, ROUTINE W REFLEX MICROSCOPIC
Bilirubin Urine: NEGATIVE
GLUCOSE, UA: NEGATIVE mg/dL
Hgb urine dipstick: NEGATIVE
KETONES UR: NEGATIVE mg/dL
LEUKOCYTES UA: NEGATIVE
Nitrite: NEGATIVE
PH: 7 (ref 5.0–8.0)
Protein, ur: NEGATIVE mg/dL
Specific Gravity, Urine: 1.01 (ref 1.005–1.030)

## 2018-01-17 LAB — POCT PREGNANCY, URINE: Preg Test, Ur: POSITIVE — AB

## 2018-01-17 MED ORDER — PRENATAL VITAMINS 0.8 MG PO TABS: 1.0000 | ORAL_TABLET | Freq: Every day | ORAL | 12 refills | Status: DC

## 2018-01-17 NOTE — MAU Provider Note (Addendum)
MAU HISTORY AND PHYSICAL  Chief Complaint:  Abdominal Pain   Haley Lopez is a 22 y.o.  [phone]  at Unknown presenting for Abdominal Pain  Positive home upt. Had a normal period starting 11/19/17 and 2 days of bleeding starting 12/19/17. Hx regular periods.  For the past 1.5 weeks has experienced intermittent bilateral lower abdominal/pelvic cramping. Had one episode of vomiting when this started, none since. No diarrhea or constipation. No fevers. No dysuria but slight increased in urinary frequency. No vaginal bleeding. No vaginal discharge or leakage of fluid. Normal appetite. No upper abdominal pain. Denies constipation. Desired pregnancy. Hx pprom with delivery just shy of 37 wks; hx 3 first tri SABs. Denies hx ectopic.    Past Medical History:  Diagnosis Date  . Anxiety   . Constipation   . Depression   . Vision abnormalities     Past Surgical History:  Procedure Laterality Date  . DILATION AND EVACUATION N/A 04/21/2016   Procedure: DILATATION AND EVACUATION;  Surgeon: Lavina Hamman, MD;  Location: [facility];  Service: Gynecology;  Laterality: N/A;  . NO PAST SURGERIES    . WISDOM TOOTH EXTRACTION      Family History  Problem Relation Age of Onset  . Depression Mother     Social History   Tobacco Use  . Smoking status: Never Smoker  . Smokeless tobacco: Never Used  Substance Use Topics  . Alcohol use: Yes    Comment: social  . Drug use: Yes    Types: Marijuana    Comment: last use 2 days ago 10/7    No Known Allergies  Medications Prior to Admission  Medication Sig Dispense Refill Last Dose  . aspirin 325 MG tablet Take 650 mg by mouth daily as needed for mild pain or headache.   Past Month at Unknown time  . oxymetazoline (AFRIN) 0.05 % nasal spray Place 1 spray into both nostrils as needed for congestion.   Past Month at Unknown time    Review of Systems - Negative except for what is mentioned in HPI.  Physical Exam  Blood pressure (!) 116/58, pulse (!) 58,  temperature 98.3 F (36.8 C), temperature source Oral, resp. rate 18, height 5\' 1"  (1.549 m), weight 45.4 kg, last menstrual period 12/19/2017. GENERAL: Well-developed, well-nourished female in no acute distress.  LUNGS: Clear to auscultation bilaterally.  HEART: Regular rate and rhythm. ABDOMEN: Soft, nontender, nondistended, no palpable masses, no guarding or rebound EXTREMITIES: Nontender, no edema, 2+ distal pulses. GU: scant white discharge, normal appearing closed cervix, mild ttp left adnexa on bimanual no masses  Labs: Results for orders placed or performed during the hospital encounter of 01/17/18 (from the past 24 hour(s))  Urinalysis, Routine w reflex microscopic   Collection Time: 01/17/18 12:11 PM  Result Value Ref Range   Color, Urine YELLOW YELLOW   APPearance CLEAR CLEAR   Specific Gravity, Urine 1.010 1.005 - 1.030   pH 7.0 5.0 - 8.0   Glucose, UA NEGATIVE NEGATIVE mg/dL   Hgb urine dipstick NEGATIVE NEGATIVE   Bilirubin Urine NEGATIVE NEGATIVE   Ketones, ur NEGATIVE NEGATIVE mg/dL   Protein, ur NEGATIVE NEGATIVE mg/dL   Nitrite NEGATIVE NEGATIVE   Leukocytes, UA NEGATIVE NEGATIVE  Pregnancy, urine POC   Collection Time: 01/17/18 12:15 PM  Result Value Ref Range   Preg Test, Ur POSITIVE (A) NEGATIVE    Imaging Studies:  US Ob Transvaginal  Result Date: 01/17/2018 CLINICAL DATA:  Lower abdominal cramping EXAM: OBSTETRIC <14 WK ULTRASOUND  TECHNIQUE: Transabdominal ultrasound was performed for evaluation of the gestation as well as the maternal uterus and adnexal regions. COMPARISON:  None. FINDINGS: Intrauterine gestational sac: Single Yolk sac:  Not Visualized. Embryo:  Not Visualized. MSD: 2.2 mm   4 w   6  d Subchorionic hemorrhage:  None visualized. Maternal uterus/adnexae: Bilateral ovaries are within normal limits. Small volume pelvic ascites. IMPRESSION: Suspected single intrauterine gestational sac, measuring 4 weeks 6 days by mean sac diameter. A yolk sac and  fetal pole are not visualized, likely due to early gestation. Follow-up pelvic ultrasound is suggested in 14 days to confirm viability, as clinically warranted. Electronically Signed   By: Charline Bills M.D.   On: 01/17/2018 14:07    Assessment: Haley Lopez is  22 y.o. [phone] at Unknown (unsure lmp) presents with pelvic pain and early pregnancy. Well appearing, hemodynamically stable. Etiology unclear; appears benign. Urinalysis unremarkable; gc pending. U/s shows likely early gestational sac, unremarkable adnexa. No yolk sac or embryo. Rh positive.   Plan: Given pregnancy of unknown location, initial hcg drawn today, pt will return in 72 hours for repeat bhcg. Bleeding and infection precautions given. Also advised calling our clinic to schedule initial prenatal - made pt aware hx pprom with ptd is indication for 17-p this pregnancy. Start pnv.  Anette Riedel B Karrah Mangini 10/9/20192:14 PM

## 2018-01-17 NOTE — Discharge Instructions (Signed)

## 2018-01-17 NOTE — MAU Note (Signed)
Pt presents to MAU with c/o lower abdominal cramping that started on Saturday 10/5. States she had a +HPT on 10/7. LMP-12/19/2017. Pt denies VB and vaginal discharge.

## 2018-01-18 LAB — GC/CHLAMYDIA PROBE AMP (~~LOC~~) NOT AT ARMC
Chlamydia: NEGATIVE
Neisseria Gonorrhea: NEGATIVE

## 2018-01-20 ENCOUNTER — Inpatient Hospital Stay (HOSPITAL_COMMUNITY)
Admission: AD | Admit: 2018-01-20 | Discharge: 2018-01-20 | Disposition: A | Discharge status: Home / Self Care | Payer: Medicaid Other | Source: Ambulatory Visit | Attending: Family Medicine | Admitting: Family Medicine

## 2018-01-20 DIAGNOSIS — Z7982 Long term (current) use of aspirin: Secondary | ICD-10-CM | POA: Insufficient documentation

## 2018-01-20 DIAGNOSIS — Z3A01 Less than 8 weeks gestation of pregnancy: Secondary | ICD-10-CM | POA: Diagnosis not present

## 2018-01-20 DIAGNOSIS — O26891 Other specified pregnancy related conditions, first trimester: Secondary | ICD-10-CM | POA: Insufficient documentation

## 2018-01-20 DIAGNOSIS — O3680X0 Pregnancy with inconclusive fetal viability, not applicable or unspecified: Secondary | ICD-10-CM

## 2018-01-20 LAB — HCG, QUANTITATIVE, PREGNANCY: HCG, BETA CHAIN, QUANT, S: 4263 m[IU]/mL — AB (ref <5)

## 2018-01-20 NOTE — MAU Provider Note (Signed)
History     CSN: [id number]  Arrival date and time: 01/20/18 1433   First Provider Initiated Contact with Patient 01/20/18 1601      Chief Complaint  Patient presents with  . Follow-up   Haley Lopez is a 22 y.o. [id number] at [redacted]w[redacted]d who presents today for FU HCG. She denies any pain or bleeding today. No questions or concerns.    OB History    Gravida  5   Para  1   Term  0   Preterm  1   AB  3   Living  1     SAB  3   TAB  0   Ectopic  0   Multiple  0   Live Births  1           Past Medical History:  Diagnosis Date  . Anxiety   . Constipation   . Depression   . Vision abnormalities     Past Surgical History:  Procedure Laterality Date  . DILATION AND EVACUATION N/A 04/21/2016   Procedure: DILATATION AND EVACUATION;  Surgeon: Lavina Hamman, MD;  Location: [facility];  Service: Gynecology;  Laterality: N/A;  . NO PAST SURGERIES    . WISDOM TOOTH EXTRACTION      Family History  Problem Relation Age of Onset  . Depression Mother     Social History   Tobacco Use  . Smoking status: Never Smoker  . Smokeless tobacco: Never Used  Substance Use Topics  . Alcohol use: Yes    Comment: social  . Drug use: Yes    Types: Marijuana    Comment: last use 2 days ago 10/7    Allergies: No Known Allergies  Medications Prior to Admission  Medication Sig Dispense Refill Last Dose  . aspirin 325 MG tablet Take 650 mg by mouth daily as needed for mild pain or headache.   Past Month at Unknown time  . oxymetazoline (AFRIN) 0.05 % nasal spray Place 1 spray into both nostrils as needed for congestion.   Past Month at Unknown time  . Prenatal Multivit-Min-Fe-FA (PRENATAL VITAMINS) 0.8 MG tablet Take 1 tablet by mouth daily. 90 tablet 12     Review of Systems  All other systems reviewed and are negative.  Physical Exam   Blood pressure (!) 117/53, pulse 62, temperature 98.5 F (36.9 C), temperature source Oral, resp. rate 17, weight 45.4 kg, last menstrual  period 12/19/2017, SpO2 100 %.  Physical Exam  Nursing note and vitals reviewed. Constitutional: She is oriented to person, place, and time. She appears well-developed and well-nourished. No distress.  HENT:  Head: Normocephalic.  Cardiovascular: Normal rate.  Respiratory: Effort normal.  Musculoskeletal: Normal range of motion.  Neurological: She is alert and oriented to person, place, and time.  Psychiatric: She has a normal mood and affect.   Results for Haley, Lopez (MRN [id number]) as of 01/20/2018 16:01  Ref. Range 01/17/2018 14:49 01/20/2018 14:55  HCG, Beta Chain, Quant, S Latest Ref Range: <5 mIU/mL 1,077 (H) 4,263 (H)   MAU Course  Procedures  MDM Reviewed with patient that hcg is rising as expected. Continue to monitor for any worsening signs and symptoms and return here as needed. Otherwise will get FU [place] in apprx 10 days.   Assessment and Plan   1. Pregnancy, location unknown    HCG rising appropriately   DC home Comfort measures reviewed  1st Trimester precautions  Bleeding precautions Ectopic precautions RX: none  Return to MAU  as needed Follow up US ordered   Follow-up Information    [facility] Follow up.   Specialty:  Radiology Contact information: [address] [id number] mc [place] [address] [phone]           Thressa Sheller 01/20/2018, 4:01 PM

## 2018-01-20 NOTE — MAU Note (Signed)
Haley Lopez is a 22 y.o. at [redacted]w[redacted]d here in MAU reporting: for follow up HCG. Denies any pain or bleeding at this time.  Vitals:   01/20/18 1601  BP: (!) 117/53  Pulse: 62  Resp: 17  Temp: 98.5 F (36.9 C)  SpO2: 100%

## 2018-01-20 NOTE — Discharge Instructions (Signed)
First Trimester of Pregnancy The first trimester of pregnancy is from week 1 until the end of week 13 (months 1 through 3). A week after a sperm fertilizes an egg, the egg will implant on the wall of the uterus. This embryo will begin to develop into a baby. Genes from you and your partner will form the baby. The female genes will determine whether the baby will be a boy or a girl. At 6-8 weeks, the eyes and face will be formed, and the heartbeat can be seen on ultrasound. At the end of 12 weeks, all the baby's organs will be formed. Now that you are pregnant, you will want to do everything you can to have a healthy baby. Two of the most important things are to get good prenatal care and to follow your health care provider's instructions. Prenatal care is all the medical care you receive before the baby's birth. This care will help prevent, find, and treat any problems during the pregnancy and childbirth. Body changes during your first trimester Your body goes through many changes during pregnancy. The changes vary from woman to woman.  You may gain or lose a couple of pounds at first.  You may feel sick to your stomach (nauseous) and you may throw up (vomit). If the vomiting is uncontrollable, call your health care provider.  You may tire easily.  You may develop headaches that can be relieved by medicines. All medicines should be approved by your health care provider.  You may urinate more often. Painful urination may mean you have a bladder infection.  You may develop heartburn as a result of your pregnancy.  You may develop constipation because certain hormones are causing the muscles that push stool through your intestines to slow down.  You may develop hemorrhoids or swollen veins (varicose veins).  Your breasts may begin to grow larger and become tender. Your nipples may stick out more, and the tissue that surrounds them (areola) may become darker.  Your gums may bleed and may be  sensitive to brushing and flossing.  Dark spots or blotches (chloasma, mask of pregnancy) may develop on your face. This will likely fade after the baby is born.  Your menstrual periods will stop.  You may have a loss of appetite.  You may develop cravings for certain kinds of food.  You may have changes in your emotions from day to day, such as being excited to be pregnant or being concerned that something may go wrong with the pregnancy and baby.  You may have more vivid and strange dreams.  You may have changes in your hair. These can include thickening of your hair, rapid growth, and changes in texture. Some women also have hair loss during or after pregnancy, or hair that feels dry or thin. Your hair will most likely return to normal after your baby is born.  What to expect at prenatal visits During a routine prenatal visit:  You will be weighed to make sure you and the baby are growing normally.  Your blood pressure will be taken.  Your abdomen will be measured to track your baby's growth.  The fetal heartbeat will be listened to between weeks 10 and 14 of your pregnancy.  Test results from any previous visits will be discussed.  Your health care provider may ask you:  How you are feeling.  If you are feeling the baby move.  If you have had any abnormal symptoms, such as leaking fluid, bleeding, severe headaches,   or abdominal cramping.  If you are using any tobacco products, including cigarettes, chewing tobacco, and electronic cigarettes.  If you have any questions.  Other tests that may be performed during your first trimester include:  Blood tests to find your blood type and to check for the presence of any previous infections. The tests will also be used to check for low iron levels (anemia) and protein on red blood cells (Rh antibodies). Depending on your risk factors, or if you previously had diabetes during pregnancy, you may have tests to check for high blood  sugar that affects pregnant women (gestational diabetes).  Urine tests to check for infections, diabetes, or protein in the urine.  An ultrasound to confirm the proper growth and development of the baby.  Fetal screens for spinal cord problems (spina bifida) and Down syndrome.  HIV (human immunodeficiency virus) testing. Routine prenatal testing includes screening for HIV, unless you choose not to have this test.  You may need other tests to make sure you and the baby are doing well.  Follow these instructions at home: Medicines  Follow your health care provider's instructions regarding medicine use. Specific medicines may be either safe or unsafe to take during pregnancy.  Take a prenatal vitamin that contains at least 600 micrograms (mcg) of folic acid.  If you develop constipation, try taking a stool softener if your health care provider approves. Eating and drinking  Eat a balanced diet that includes fresh fruits and vegetables, whole grains, good sources of protein such as meat, eggs, or tofu, and low-fat dairy. Your health care provider will help you determine the amount of weight gain that is right for you.  Avoid raw meat and uncooked cheese. These carry germs that can cause birth defects in the baby.  Eating four or five small meals rather than three large meals a day may help relieve nausea and vomiting. If you start to feel nauseous, eating a few soda crackers can be helpful. Drinking liquids between meals, instead of during meals, also seems to help ease nausea and vomiting.  Limit foods that are high in fat and processed sugars, such as fried and sweet foods.  To prevent constipation: ? Eat foods that are high in fiber, such as fresh fruits and vegetables, whole grains, and beans. ? Drink enough fluid to keep your urine clear or pale yellow. Activity  Exercise only as directed by your health care provider. Most women can continue their usual exercise routine during  pregnancy. Try to exercise for 30 minutes at least 5 days a week. Exercising will help you: ? Control your weight. ? Stay in shape. ? Be prepared for labor and delivery.  Experiencing pain or cramping in the lower abdomen or lower back is a good sign that you should stop exercising. Check with your health care provider before continuing with normal exercises.  Try to avoid standing for long periods of time. Move your legs often if you must stand in one place for a long time.  Avoid heavy lifting.  Wear low-heeled shoes and practice good posture.  You may continue to have sex unless your health care provider tells you not to. Relieving pain and discomfort  Wear a good support bra to relieve breast tenderness.  Take warm sitz baths to soothe any pain or discomfort caused by hemorrhoids. Use hemorrhoid cream if your health care provider approves.  Rest with your legs elevated if you have leg cramps or low back pain.  If you develop   varicose veins in your legs, wear support hose. Elevate your feet for 15 minutes, 3-4 times a day. Limit salt in your diet. Prenatal care  Schedule your prenatal visits by the twelfth week of pregnancy. They are usually scheduled monthly at first, then more often in the last 2 months before delivery.  Write down your questions. Take them to your prenatal visits.  Keep all your prenatal visits as told by your health care provider. This is important. Safety  Wear your seat belt at all times when driving.  Make a list of emergency phone numbers, including numbers for family, friends, the hospital, and police and fire departments. General instructions  Ask your health care provider for a referral to a local prenatal education class. Begin classes no later than the beginning of month 6 of your pregnancy.  Ask for help if you have counseling or nutritional needs during pregnancy. Your health care provider can offer advice or refer you to specialists for help  with various needs.  Do not use hot tubs, steam rooms, or saunas.  Do not douche or use tampons or scented sanitary pads.  Do not cross your legs for long periods of time.  Avoid cat litter boxes and soil used by cats. These carry germs that can cause birth defects in the baby and possibly loss of the fetus by miscarriage or stillbirth.  Avoid all smoking, herbs, alcohol, and medicines not prescribed by your health care provider. Chemicals in these products affect the formation and growth of the baby.  Do not use any products that contain nicotine or tobacco, such as cigarettes and e-cigarettes. If you need help quitting, ask your health care provider. You may receive counseling support and other resources to help you quit.  Schedule a dentist appointment. At home, brush your teeth with a soft toothbrush and be gentle when you floss. Contact a health care provider if:  You have dizziness.  You have mild pelvic cramps, pelvic pressure, or nagging pain in the abdominal area.  You have persistent nausea, vomiting, or diarrhea.  You have a bad smelling vaginal discharge.  You have pain when you urinate.  You notice increased swelling in your face, hands, legs, or ankles.  You are exposed to fifth disease or chickenpox.  You are exposed to German measles (rubella) and have never had it. Get help right away if:  You have a fever.  You are leaking fluid from your vagina.  You have spotting or bleeding from your vagina.  You have severe abdominal cramping or pain.  You have rapid weight gain or loss.  You vomit blood or material that looks like coffee grounds.  You develop a severe headache.  You have shortness of breath.  You have any kind of trauma, such as from a fall or a car accident. Summary  The first trimester of pregnancy is from week 1 until the end of week 13 (months 1 through 3).  Your body goes through many changes during pregnancy. The changes vary from  woman to woman.  You will have routine prenatal visits. During those visits, your health care provider will examine you, discuss any test results you may have, and talk with you about how you are feeling. This information is not intended to replace advice given to you by your health care provider. Make sure you discuss any questions you have with your health care provider. Document Released: 03/22/2001 Document Revised: 03/09/2016 Document Reviewed: 03/09/2016 Elsevier Interactive Patient Education  2018 Elsevier   Inc.  

## 2018-01-26 ENCOUNTER — Ambulatory Visit (HOSPITAL_COMMUNITY)
Admission: RE | Admit: 2018-01-26 | Discharge: 2018-01-26 | Disposition: A | Discharge status: Home / Self Care | Payer: Medicaid Other | Source: Ambulatory Visit | Attending: Advanced Practice Midwife | Admitting: Advanced Practice Midwife

## 2018-01-26 ENCOUNTER — Ambulatory Visit (INDEPENDENT_AMBULATORY_CARE_PROVIDER_SITE_OTHER): Payer: Self-pay | Admitting: *Deleted

## 2018-01-26 DIAGNOSIS — O3680X0 Pregnancy with inconclusive fetal viability, not applicable or unspecified: Secondary | ICD-10-CM

## 2018-01-26 DIAGNOSIS — Z712 Person consulting for explanation of examination or test findings: Secondary | ICD-10-CM

## 2018-01-26 DIAGNOSIS — O208 Other hemorrhage in early pregnancy: Secondary | ICD-10-CM | POA: Insufficient documentation

## 2018-01-26 DIAGNOSIS — Z3A01 Less than 8 weeks gestation of pregnancy: Secondary | ICD-10-CM | POA: Insufficient documentation

## 2018-01-26 NOTE — Progress Notes (Addendum)
[place] results from today reviewed by Dr. Erin Fulling.  Pt denies having vaginal bleeding or abdominal/pelvic pain. She was informed of normal progression of pregnancy per ultrasound, however it is still too early to see a heartbeat. Recommendation is for repeat US in 2 weeks.  Pt voiced understanding and agreed to plan of care. [place] scheduled on 11/1 @ 0900.  Attestation of Attending Supervision of RN: Evaluation and management procedures were performed by the nurse under my supervision and collaboration.  I have reviewed the nursing note and chart, and I agree with the management and plan.  Carolyn L. Harraway-Smith, M.D., [facility]

## 2018-02-09 ENCOUNTER — Ambulatory Visit (HOSPITAL_COMMUNITY): Payer: Medicaid Other | Attending: Obstetrics & Gynecology

## 2018-02-09 ENCOUNTER — Ambulatory Visit: Payer: Self-pay

## 2018-04-11 NOTE — L&D Delivery Note (Addendum)
**  Late entry due to intermittent Epic downtime during the night**  Delivery Note Pt became complete with lots of urge to push at 0332. At 3:37 AM a viable female was delivered via Vaginal, Spontaneous (Presentation: LOA with L hand compound).  APGAR: 9, 9; weight 5 lb 5 oz (2410 g).  Infant dried and placed on pt's abd; cord clamped and cut by FOB after 1 min delay; hospital cord blood sample collected. Placenta status: spont, intact.  Cord: 3 vessel  Anesthesia:  Epidural Episiotomy: None Lacerations: None Est. Blood Loss (mL): 51  Mom to postpartum.  Baby to Couplet care / Skin to Skin.  Haley Lopez CNM 09/06/2018, 6:38 AM  Please schedule this patient for Postpartum visit in: 4 weeks with the following provider: Any provider For C/S patients schedule nurse incision check in weeks 2 weeks: no Low risk pregnancy complicated by: hx preterm birth; tx care at 34wks Delivery mode:  SVD Anticipated Birth Control:  other/unsure- declines PP Procedures needed: none  Schedule Integrated BH visit: no

## 2018-05-04 ENCOUNTER — Encounter (HOSPITAL_COMMUNITY): Payer: Self-pay | Admitting: *Deleted

## 2018-05-04 ENCOUNTER — Inpatient Hospital Stay (HOSPITAL_COMMUNITY)
Admission: AD | Admit: 2018-05-04 | Discharge: 2018-05-04 | Disposition: A | Discharge status: Home / Self Care | Payer: Medicaid Other | Attending: Obstetrics and Gynecology | Admitting: Obstetrics and Gynecology

## 2018-05-04 DIAGNOSIS — R109 Unspecified abdominal pain: Secondary | ICD-10-CM | POA: Diagnosis present

## 2018-05-04 DIAGNOSIS — B9689 Other specified bacterial agents as the cause of diseases classified elsewhere: Secondary | ICD-10-CM | POA: Diagnosis not present

## 2018-05-04 DIAGNOSIS — Z3492 Encounter for supervision of normal pregnancy, unspecified, second trimester: Secondary | ICD-10-CM

## 2018-05-04 DIAGNOSIS — O26892 Other specified pregnancy related conditions, second trimester: Secondary | ICD-10-CM

## 2018-05-04 DIAGNOSIS — O23592 Infection of other part of genital tract in pregnancy, second trimester: Secondary | ICD-10-CM | POA: Insufficient documentation

## 2018-05-04 DIAGNOSIS — N760 Acute vaginitis: Secondary | ICD-10-CM

## 2018-05-04 DIAGNOSIS — Z3A20 20 weeks gestation of pregnancy: Secondary | ICD-10-CM | POA: Diagnosis not present

## 2018-05-04 DIAGNOSIS — O26872 Cervical shortening, second trimester: Secondary | ICD-10-CM | POA: Insufficient documentation

## 2018-05-04 LAB — WET PREP, GENITAL
Sperm: NONE SEEN
TRICH WET PREP: NONE SEEN
Yeast Wet Prep HPF POC: NONE SEEN

## 2018-05-04 LAB — URINALYSIS, ROUTINE W REFLEX MICROSCOPIC
BILIRUBIN URINE: NEGATIVE
GLUCOSE, UA: NEGATIVE mg/dL
Hgb urine dipstick: NEGATIVE
Ketones, ur: NEGATIVE mg/dL
LEUKOCYTES UA: NEGATIVE
Nitrite: NEGATIVE
Protein, ur: NEGATIVE mg/dL
SPECIFIC GRAVITY, URINE: 1.016 (ref 1.005–1.030)
pH: 6 (ref 5.0–8.0)

## 2018-05-04 MED ORDER — METRONIDAZOLE 500 MG PO TABS: 500.0000 mg | ORAL_TABLET | Freq: Two times a day (BID) | ORAL | 0 refills | Status: DC

## 2018-05-04 MED ORDER — ACETAMINOPHEN 325 MG PO TABS: 650.0000 mg | ORAL_TABLET | Freq: Once | ORAL | Status: DC

## 2018-05-04 NOTE — Discharge Instructions (Signed)
Bacterial Vaginosis    Bacterial vaginosis is a vaginal infection that occurs when the normal balance of bacteria in the vagina is disrupted. It results from an overgrowth of certain bacteria. This is the most common vaginal infection among women ages 15-44.  Because bacterial vaginosis increases your risk for STIs (sexually transmitted infections), getting treated can help reduce your risk for chlamydia, gonorrhea, herpes, and HIV (human immunodeficiency virus). Treatment is also important for preventing complications in pregnant women, because this condition can cause an early (premature) delivery.  What are the causes?  This condition is caused by an increase in harmful bacteria that are normally present in small amounts in the vagina. However, the reason that the condition develops is not fully understood.  What increases the risk?  The following factors may make you more likely to develop this condition:  · Having a new sexual partner or multiple sexual partners.  · Having unprotected sex.  · Douching.  · Having an intrauterine device (IUD).  · Smoking.  · Drug and alcohol abuse.  · Taking certain antibiotic medicines.  · Being pregnant.  You cannot get bacterial vaginosis from toilet seats, bedding, swimming pools, or contact with objects around you.  What are the signs or symptoms?  Symptoms of this condition include:  · Grey or white vaginal discharge. The discharge can also be watery or foamy.  · A fish-like odor with discharge, especially after sexual intercourse or during menstruation.  · Itching in and around the vagina.  · Burning or pain with urination.  Some women with bacterial vaginosis have no signs or symptoms.  How is this diagnosed?  This condition is diagnosed based on:  · Your medical history.  · A physical exam of the vagina.  · Testing a sample of vaginal fluid under a microscope to look for a large amount of bad bacteria or abnormal cells. Your health care provider may use a cotton swab or  a small wooden spatula to collect the sample.  How is this treated?  This condition is treated with antibiotics. These may be given as a pill, a vaginal cream, or a medicine that is put into the vagina (suppository). If the condition comes back after treatment, a second round of antibiotics may be needed.  Follow these instructions at home:  Medicines  · Take over-the-counter and prescription medicines only as told by your health care provider.  · Take or use your antibiotic as told by your health care provider. Do not stop taking or using the antibiotic even if you start to feel better.  General instructions  · If you have a female sexual partner, tell her that you have a vaginal infection. She should see her health care provider and be treated if she has symptoms. If you have a female sexual partner, he does not need treatment.  · During treatment:  ? Avoid sexual activity until you finish treatment.  ? Do not douche.  ? Avoid alcohol as directed by your health care provider.  ? Avoid breastfeeding as directed by your health care provider.  · Drink enough water and fluids to keep your urine clear or pale yellow.  · Keep the area around your vagina and rectum clean.  ? Wash the area daily with warm water.  ? Wipe yourself from front to back after using the toilet.  · Keep all follow-up visits as told by your health care provider. This is important.  How is this prevented?  · Do not   douche.  · Wash the outside of your vagina with warm water only.  · Use protection when having sex. This includes latex condoms and dental dams.  · Limit how many sexual partners you have. To help prevent bacterial vaginosis, it is best to have sex with just one partner (monogamous).  · Make sure you and your sexual partner are tested for STIs.  · Wear cotton or cotton-lined underwear.  · Avoid wearing tight pants and pantyhose, especially during summer.  · Limit the amount of alcohol that you drink.  · Do not use any products that contain  nicotine or tobacco, such as cigarettes and e-cigarettes. If you need help quitting, ask your health care provider.  · Do not use illegal drugs.  Where to find more information  · Centers for Disease Control and Prevention: www.cdc.gov/std  · American Sexual Health Association (ASHA): www.ashastd.org  · U.S. Department of Health and Human Services, Office on Women's Health: www.womenshealth.gov/ or https://www.womenshealth.gov/a-z-topics/bacterial-vaginosis  Contact a health care provider if:  · Your symptoms do not improve, even after treatment.  · You have more discharge or pain when urinating.  · You have a fever.  · You have pain in your abdomen.  · You have pain during sex.  · You have vaginal bleeding between periods.  Summary  · Bacterial vaginosis is a vaginal infection that occurs when the normal balance of bacteria in the vagina is disrupted.  · Because bacterial vaginosis increases your risk for STIs (sexually transmitted infections), getting treated can help reduce your risk for chlamydia, gonorrhea, herpes, and HIV (human immunodeficiency virus). Treatment is also important for preventing complications in pregnant women, because the condition can cause an early (premature) delivery.  · This condition is treated with antibiotic medicines. These may be given as a pill, a vaginal cream, or a medicine that is put into the vagina (suppository).  This information is not intended to replace advice given to you by your health care provider. Make sure you discuss any questions you have with your health care provider.  Document Released: 03/28/2005 Document Revised: 08/01/2016 Document Reviewed: 12/12/2015  Elsevier Interactive Patient Education © 2019 Elsevier Inc.

## 2018-05-04 NOTE — MAU Note (Signed)
Pt stats she receives her care in [place]  at [facility] care. Progesterone shots were started last Wednesday, states she was told her cervix was 2 cms dilated

## 2018-05-04 NOTE — MAU Note (Signed)
Pt reports pain in her lower abd for a few day, white discharge. Denies bleeding.

## 2018-05-04 NOTE — MAU Provider Note (Signed)
History     CSN: [id number]  Arrival date and time: 05/04/18 1220   None     Chief Complaint  Patient presents with  . Vaginal Discharge  . Abdominal Pain   HPI Haley Lopez is a 23 y.o. [id number] at [redacted]w[redacted]d who presents to MAU with chief complaints of abdominal pain and abnormal vaginal discharge. These are recurring problems, onset "about two months ago".  Patient receives [facility] in [place], [place]. She was recently diagnosed with short cervix and has had two administrations of 17P.  She states she was told her cevrical length is 2cm. She is compliant with pelvic rest as encouraged by her [facility] provider. Most recent sexual intercourse two weeks ago.  Patient's abdominal cramping is generalized across her entire abdomen. She rates it as 2-4/10. Her pain does not radiate. She denies aggravating or alleviating factors. She has not taken medication or tried other treatments for this pain.  OB History    Gravida  5   Para  1   Term  0   Preterm  1   AB  3   Living  1     SAB  3   TAB  0   Ectopic  0   Multiple  0   Live Births  1           Past Medical History:  Diagnosis Date  . Anxiety   . Constipation   . Depression   . Vision abnormalities     Past Surgical History:  Procedure Laterality Date  . DILATION AND EVACUATION N/A 04/21/2016   Procedure: DILATATION AND EVACUATION;  Surgeon: Lavina Hamman, MD;  Location: [facility];  Service: Gynecology;  Laterality: N/A;  . NO PAST SURGERIES    . WISDOM TOOTH EXTRACTION      Family History  Problem Relation Age of Onset  . Depression Mother     Social History   Tobacco Use  . Smoking status: Never Smoker  . Smokeless tobacco: Never Used  Substance Use Topics  . Alcohol use: Yes    Comment: social  . Drug use: Yes    Types: Marijuana    Comment: last use 2 days ago 10/7    Allergies: No Known Allergies  Medications Prior to Admission  Medication Sig Dispense Refill Last Dose  . oxymetazoline (AFRIN)  0.05 % nasal spray Place 1 spray into both nostrils as needed for congestion.   Past Month at Unknown time  . Prenatal Multivit-Min-Fe-FA (PRENATAL VITAMINS) 0.8 MG tablet Take 1 tablet by mouth daily. 90 tablet 12     Review of Systems  Gastrointestinal: Positive for abdominal pain.       Pain is periumbilical, 2-5/10, does not radiate. Denies aggravating or alleviating factors  Genitourinary: Positive for vaginal discharge. Negative for vaginal bleeding and vaginal pain.  Musculoskeletal: Negative for back pain.  All other systems reviewed and are negative.  Physical Exam   Blood pressure 111/71, pulse 77, temperature 98.1 F (36.7 C), temperature source Oral, resp. rate 16, height 5' (1.524 m), weight 49 kg, last menstrual period 12/19/2017, SpO2 98 %.  Physical Exam  Nursing note and vitals reviewed. Constitutional: She is oriented to person, place, and time. She appears well-developed and well-nourished.  Cardiovascular: Normal rate.  Respiratory: Effort normal.  GI: Soft. She exhibits no distension. There is no abdominal tenderness. There is no rebound, no guarding and no CVA tenderness.  Genitourinary:    Vaginal discharge present.     Genitourinary Comments:  Scant thin, white discharge proximal to cervical os. Slightly foul odor.   Neurological: She is alert and oriented to person, place, and time.  Skin: Skin is warm and dry.  Psychiatric: She has a normal mood and affect. Her behavior is normal. Judgment and thought content normal.    MAU Course/MDM  Procedures: sterile speculum exam  --Cervix closed/soft on bimanual exam --Pain relieved with Tylenol given in MAU  Patient Vitals for the past 24 hrs:  BP Temp Temp src Pulse Resp SpO2 Height Weight  05/04/18 1346 (!) 92/51 - - (!) 57 16 99 % - -  05/04/18 1243 111/71 98.1 F (36.7 C) Oral 77 16 98 % 5' (1.524 m) 49 kg    Results for orders placed or performed during the hospital encounter of 05/04/18 (from the past  24 hour(s))  Urinalysis, Routine w reflex microscopic     Status: None   Collection Time: 05/04/18  1:02 PM  Result Value Ref Range   Color, Urine YELLOW YELLOW   APPearance CLEAR CLEAR   Specific Gravity, Urine 1.016 1.005 - 1.030   pH 6.0 5.0 - 8.0   Glucose, UA NEGATIVE NEGATIVE mg/dL   Hgb urine dipstick NEGATIVE NEGATIVE   Bilirubin Urine NEGATIVE NEGATIVE   Ketones, ur NEGATIVE NEGATIVE mg/dL   Protein, ur NEGATIVE NEGATIVE mg/dL   Nitrite NEGATIVE NEGATIVE   Leukocytes, UA NEGATIVE NEGATIVE  Wet prep, genital     Status: Abnormal   Collection Time: 05/04/18  1:13 PM  Result Value Ref Range   Yeast Wet Prep HPF POC NONE SEEN NONE SEEN   Trich, Wet Prep NONE SEEN NONE SEEN   Clue Cells Wet Prep HPF POC PRESENT (A) NONE SEEN   WBC, Wet Prep HPF POC MODERATE (A) NONE SEEN   Sperm NONE SEEN     Meds ordered this encounter  Medications  . acetaminophen (TYLENOL) tablet 650 mg  . metroNIDAZOLE (FLAGYL) 500 MG tablet    Sig: Take 1 tablet (500 mg total) by mouth 2 (two) times daily.    Dispense:  14 tablet    Refill:  0    Order Specific Question:   Supervising Provider    Answer:   Reva BoresPRATT, TANYA S [[id number]]    Assessment and Plan  --23 y.o. [id number] at 2846w1d  --FHT 155 by Doppler --Bacterial Vaginosis, paper rx given to patient --Discharge home in stable condition  Calvert CantorSamantha C Maebry Obrien, CNM 05/04/2018, 2:14 PM

## 2018-05-07 LAB — GC/CHLAMYDIA PROBE AMP (~~LOC~~) NOT AT ARMC
Chlamydia: NEGATIVE
Neisseria Gonorrhea: NEGATIVE

## 2018-07-09 ENCOUNTER — Encounter: Payer: Medicaid Other | Admitting: Obstetrics

## 2018-07-13 ENCOUNTER — Other Ambulatory Visit: Payer: Self-pay

## 2018-07-13 ENCOUNTER — Encounter (HOSPITAL_COMMUNITY): Payer: Self-pay | Admitting: *Deleted

## 2018-07-13 ENCOUNTER — Inpatient Hospital Stay (HOSPITAL_COMMUNITY)
Admission: AD | Admit: 2018-07-13 | Discharge: 2018-07-13 | Disposition: A | Discharge status: Home / Self Care | Payer: Medicaid Other | Attending: Obstetrics & Gynecology | Admitting: Obstetrics & Gynecology

## 2018-07-13 DIAGNOSIS — O99013 Anemia complicating pregnancy, third trimester: Secondary | ICD-10-CM | POA: Insufficient documentation

## 2018-07-13 DIAGNOSIS — A084 Viral intestinal infection, unspecified: Secondary | ICD-10-CM | POA: Insufficient documentation

## 2018-07-13 DIAGNOSIS — O99343 Other mental disorders complicating pregnancy, third trimester: Secondary | ICD-10-CM | POA: Diagnosis not present

## 2018-07-13 DIAGNOSIS — Z792 Long term (current) use of antibiotics: Secondary | ICD-10-CM | POA: Diagnosis not present

## 2018-07-13 DIAGNOSIS — O98513 Other viral diseases complicating pregnancy, third trimester: Secondary | ICD-10-CM | POA: Diagnosis not present

## 2018-07-13 DIAGNOSIS — F419 Anxiety disorder, unspecified: Secondary | ICD-10-CM | POA: Diagnosis not present

## 2018-07-13 DIAGNOSIS — O4703 False labor before 37 completed weeks of gestation, third trimester: Secondary | ICD-10-CM

## 2018-07-13 DIAGNOSIS — Z818 Family history of other mental and behavioral disorders: Secondary | ICD-10-CM | POA: Insufficient documentation

## 2018-07-13 DIAGNOSIS — Z3A30 30 weeks gestation of pregnancy: Secondary | ICD-10-CM | POA: Diagnosis not present

## 2018-07-13 HISTORY — DX: Anemia, unspecified: D64.9

## 2018-07-13 LAB — COMPREHENSIVE METABOLIC PANEL
ALT: 14 U/L (ref 0–44)
AST: 22 U/L (ref 15–41)
Albumin: 2.8 g/dL — ABNORMAL LOW (ref 3.5–5.0)
Alkaline Phosphatase: 84 U/L (ref 38–126)
Anion gap: 10 (ref 5–15)
BUN: <5 mg/dL — ABNORMAL LOW (ref 6–20)
CO2: 23 mmol/L (ref 22–32)
Calcium: 8.4 mg/dL — ABNORMAL LOW (ref 8.9–10.3)
Chloride: 104 mmol/L (ref 98–111)
Creatinine, Ser: 0.49 mg/dL (ref 0.44–1.00)
GFR calc Af Amer: >60 mL/min (ref >60)
GFR calc non Af Amer: >60 mL/min (ref >60)
Glucose, Bld: 76 mg/dL (ref 70–99)
Potassium: 3.2 mmol/L — ABNORMAL LOW (ref 3.5–5.1)
Sodium: 137 mmol/L (ref 135–145)
Total Bilirubin: 0.4 mg/dL (ref 0.3–1.2)
Total Protein: 6.1 g/dL — ABNORMAL LOW (ref 6.5–8.1)

## 2018-07-13 LAB — CBC WITH DIFFERENTIAL/PLATELET
Abs Immature Granulocytes: 0.22 10*3/uL — ABNORMAL HIGH (ref 0.00–0.07)
Basophils Absolute: 0 10*3/uL (ref 0.0–0.1)
Basophils Relative: 0 %
Eosinophils Absolute: 0 10*3/uL (ref 0.0–0.5)
Eosinophils Relative: 0 %
HCT: 32.5 % — ABNORMAL LOW (ref 36.0–46.0)
Hemoglobin: 10.9 g/dL — ABNORMAL LOW (ref 12.0–15.0)
Immature Granulocytes: 3 %
Lymphocytes Relative: 17 %
Lymphs Abs: 1.3 10*3/uL (ref 0.7–4.0)
MCH: 28.3 pg (ref 26.0–34.0)
MCHC: 33.5 g/dL (ref 30.0–36.0)
MCV: 84.4 fL (ref 80.0–100.0)
Monocytes Absolute: 0.4 10*3/uL (ref 0.1–1.0)
Monocytes Relative: 5 %
Neutro Abs: 5.9 10*3/uL (ref 1.7–7.7)
Neutrophils Relative %: 75 %
Platelets: 194 10*3/uL (ref 150–400)
RBC: 3.85 MIL/uL — ABNORMAL LOW (ref 3.87–5.11)
RDW: 12.6 % (ref 11.5–15.5)
WBC: 7.8 10*3/uL (ref 4.0–10.5)
nRBC: 0 % (ref 0.0–0.2)

## 2018-07-13 LAB — URINALYSIS, ROUTINE W REFLEX MICROSCOPIC
Bacteria, UA: NONE SEEN
Bilirubin Urine: NEGATIVE
Glucose, UA: NEGATIVE mg/dL
Hgb urine dipstick: NEGATIVE
Ketones, ur: NEGATIVE mg/dL
Leukocytes,Ua: NEGATIVE
Nitrite: NEGATIVE
Protein, ur: 30 mg/dL — AB
Specific Gravity, Urine: 1.021 (ref 1.005–1.030)
pH: 8 (ref 5.0–8.0)

## 2018-07-13 LAB — WET PREP, GENITAL
Sperm: NONE SEEN
Trich, Wet Prep: NONE SEEN
Yeast Wet Prep HPF POC: NONE SEEN

## 2018-07-13 LAB — LIPASE, BLOOD: Lipase: 29 U/L (ref 11–51)

## 2018-07-13 LAB — AMYLASE: Amylase: 82 U/L (ref 28–100)

## 2018-07-13 MED ORDER — LACTATED RINGERS IV BOLUS
1000.0000 mL | Freq: Once | INTRAVENOUS | Status: AC
  Administered 2018-07-13: 12:00:00 1000 mL via INTRAVENOUS

## 2018-07-13 MED ORDER — ONDANSETRON HCL 4 MG/2ML IJ SOLN
4.0000 mg | Freq: Once | INTRAMUSCULAR | Status: AC
  Administered 2018-07-13: 4 mg via INTRAVENOUS
  Filled 2018-07-13: qty 2

## 2018-07-13 MED ORDER — NIFEDIPINE 10 MG PO CAPS
10.0000 mg | ORAL_CAPSULE | ORAL | Status: AC
  Administered 2018-07-13: 10 mg via ORAL
  Filled 2018-07-13 (×2): qty 1

## 2018-07-13 MED ORDER — ONDANSETRON 8 MG PO TBDP: 8.0000 mg | ORAL_TABLET | Freq: Three times a day (TID) | ORAL | 0 refills | Status: DC | PRN

## 2018-07-13 NOTE — MAU Provider Note (Signed)
History     CSN: [id number]  Arrival date and time: 07/13/18 1030   First Provider Initiated Contact with Patient 07/13/18 1051      Chief Complaint  Patient presents with  . Abdominal Pain   Haley Lopez is a 23 y.o [id number] at [redacted]w[redacted]d who presents today with sudden onset abdominal pain at around 0700 today. She has also had 2 episodes of diarrhea. She denies any contractions, VB or LOF. She reports normal fetal movement. She has had intercourse in the last 24 hours. Patient is from [facility], and is getting prenatal care there. She states that she has been getting cervical length measurements due to prior pre-term delivery. They stopped checking, and patient reports last check it was 4cm. She has had a prior 34 week delivery after spontaneous labor.   Abdominal Pain  This is a new problem. The current episode started today (around 0700). The onset quality is sudden. The problem occurs constantly. The problem has been unchanged. The pain is located in the periumbilical region. The pain is at a severity of 10/10. Associated symptoms include diarrhea. Pertinent negatives include no dysuria, fever, frequency, nausea or vomiting. Nothing aggravates the pain. The pain is relieved by nothing. She has tried nothing for the symptoms.    OB History    Gravida  5   Para  1   Term  0   Preterm  1   AB  3   Living  1     SAB  3   TAB  0   Ectopic  0   Multiple  0   Live Births  1           Past Medical History:  Diagnosis Date  . Anemia   . Anxiety   . Constipation   . Depression   . Vision abnormalities     Past Surgical History:  Procedure Laterality Date  . DILATION AND EVACUATION N/A 04/21/2016   Procedure: DILATATION AND EVACUATION;  Surgeon: Lavina Hamman, MD;  Location: [facility];  Service: Gynecology;  Laterality: N/A;  . WISDOM TOOTH EXTRACTION      Family History  Problem Relation Age of Onset  . Depression Mother     Social History   Tobacco Use   . Smoking status: Never Smoker  . Smokeless tobacco: Never Used  Substance Use Topics  . Alcohol use: Not Currently    Comment: social  . Drug use: Not Currently    Types: Marijuana    Comment: last use 2 days ago 10/7    Allergies: No Known Allergies  Medications Prior to Admission  Medication Sig Dispense Refill Last Dose  . ferrous sulfate 325 (65 FE) MG tablet Take 325 mg by mouth daily with breakfast.   07/12/2018 at Unknown time  . Prenatal Multivit-Min-Fe-FA (PRENATAL VITAMINS) 0.8 MG tablet Take 1 tablet by mouth daily. 90 tablet 12 07/12/2018 at Unknown time  . metroNIDAZOLE (FLAGYL) 500 MG tablet Take 1 tablet (500 mg total) by mouth 2 (two) times daily. 14 tablet 0   . oxymetazoline (AFRIN) 0.05 % nasal spray Place 1 spray into both nostrils as needed for congestion.   Past Month at Unknown time    Review of Systems  Constitutional: Negative for chills and fever.  Gastrointestinal: Positive for abdominal pain and diarrhea. Negative for nausea and vomiting.  Genitourinary: Negative for dysuria, frequency, pelvic pain, vaginal bleeding and vaginal discharge.   Physical Exam   Blood pressure 108/61, pulse (!) 55,  temperature 97.6 F (36.4 C), temperature source Oral, resp. rate 20, last menstrual period 12/19/2017, SpO2 100 %.  Physical Exam  Nursing note and vitals reviewed. Constitutional: She is oriented to person, place, and time. She appears well-developed and well-nourished. She appears distressed.  HENT:  Head: Normocephalic.  Cardiovascular: Normal rate.  Respiratory: Effort normal.  GI: Soft. There is no abdominal tenderness. There is no rebound.  Genitourinary:    Genitourinary Comments:  External: no lesion  Vagina: small amount of white discharge  Cervix: pink, smooth, Dilation: 1 Effacement (%): 50 Station: -3 Exam by:: Zorita Pang CNM  Uterus: AGA    Neurological: She is alert and oriented to person, place, and time.  Skin: Skin is warm and dry.    NST:  Baseline: 120 Variability: moderate Accels: 15x15 Decels: none Toco: not tracing, but patient is writhing in pain approx every 5 mins, very mild contractions to palpation    MAU Course  Procedures  MDM Unable to collect FFN due to the fact that patient has had intercourse in the last 24 hours.  1:21 PM patient has had IV fluids, zofran and one dose of procardia. She reports that her pain is 0/10, and she is feeling better. She is tolerating PO at this time. No cervical change. Patient has FU with OB in [place] on 07/17/2018. Patient advised to keep FU as planned and return as needed.   Assessment and Plan   1. Viral gastroenteritis   2. [redacted] weeks gestation of pregnancy   3. Threatened premature labor in third trimester    DC home Comfort measures reviewed  3rd Trimester precautions  PTL precautions  Fetal kick counts RX: zofran PRN #20  Return to MAU as needed FU with OB as planned  Follow-up Information    Your Primary OB Follow up.   Why:  As scheduled

## 2018-07-13 NOTE — Discharge Instructions (Signed)
Food Choices to Help Relieve Diarrhea, Adult When you have diarrhea, the foods you eat and your eating habits are very important. Choosing the right foods and drinks can help:  Relieve diarrhea.  Replace lost fluids and nutrients.  Prevent dehydration. What general guidelines should I follow?  Relieving diarrhea  Choose foods with less than 2 g or .07 oz. of fiber per serving.  Limit fats to less than 8 tsp (38 g or 1.34 oz.) a day.  Avoid the following: ? Foods and beverages sweetened with high-fructose corn syrup, honey, or sugar alcohols such as xylitol, sorbitol, and mannitol. ? Foods that contain a lot of fat or sugar. ? Fried, greasy, or spicy foods. ? High-fiber grains, breads, and cereals. ? Raw fruits and vegetables.  Eat foods that are rich in probiotics. These foods include dairy products such as yogurt and fermented milk products. They help increase healthy bacteria in the stomach and intestines (gastrointestinal tract, or GI tract).  If you have lactose intolerance, avoid dairy products. These may make your diarrhea worse.  Take medicine to help stop diarrhea (antidiarrheal medicine) only as told by your health care provider. Replacing nutrients  Eat small meals or snacks every 3-4 hours.  Eat bland foods, such as white rice, toast, or baked potato, until your diarrhea starts to get better. Gradually reintroduce nutrient-rich foods as tolerated or as told by your health care provider. This includes: ? Well-cooked protein foods. ? Peeled, seeded, and soft-cooked fruits and vegetables. ? Low-fat dairy products.  Take vitamin and mineral supplements as told by your health care provider. Preventing dehydration  Start by sipping water or a special solution to prevent dehydration (oral rehydration solution, ORS). Urine that is clear or pale yellow means that you are getting enough fluid.  Try to drink at least 8-10 cups of fluid each day to help replace lost  fluids.  You may add other liquids in addition to water, such as clear juice or decaffeinated sports drinks, as tolerated or as told by your health care provider.  Avoid drinks with caffeine, such as coffee, tea, or soft drinks.  Avoid alcohol. What foods are recommended?     The items listed may not be a complete list. Talk with your health care provider about what dietary choices are best for you. Grains White rice. White, [place], or pita breads (fresh or toasted), including plain rolls, buns, or bagels. White pasta. Saltine, soda, or graham crackers. Pretzels. Low-fiber cereal. Cooked cereals made with water (such as cornmeal, farina, or cream cereals). Plain muffins. Matzo. Melba toast. Zwieback. Vegetables Potatoes (without the skin). Most well-cooked and canned vegetables without skins or seeds. Tender lettuce. Fruits Apple sauce. Fruits canned in juice. Cooked apricots, cherries, grapefruit, peaches, pears, or plums. Fresh bananas and cantaloupe. Meats and other protein foods Baked or boiled chicken. Eggs. Tofu. Fish. Seafood. Smooth nut butters. Ground or well-cooked tender beef, ham, veal, lamb, pork, or poultry. Dairy Plain yogurt, kefir, and unsweetened liquid yogurt. Lactose-free milk, buttermilk, skim milk, or soy milk. Low-fat or nonfat hard cheese. Beverages Water. Low-calorie sports drinks. Fruit juices without pulp. Strained tomato and vegetable juices. Decaffeinated teas. Sugar-free beverages not sweetened with sugar alcohols. Oral rehydration solutions, if approved by your health care provider. Seasoning and other foods Bouillon, broth, or soups made from recommended foods. What foods are not recommended? The items listed may not be a complete list. Talk with your health care provider about what dietary choices are best for you. Grains Whole  grain, whole wheat, bran, or rye breads, rolls, pastas, and crackers. Wild or brown rice. Whole grain or bran cereals. Barley.  Oats and oatmeal. Corn tortillas or taco shells. Granola. Popcorn. Vegetables Raw vegetables. Fried vegetables. Cabbage, broccoli, Brussels sprouts, artichokes, baked beans, beet greens, corn, kale, legumes, peas, sweet potatoes, and yams. Potato skins. Cooked spinach and cabbage. Fruits Dried fruit, including raisins and dates. Raw fruits. Stewed or dried prunes. Canned fruits with syrup. Meat and other protein foods Fried or fatty meats. Deli meats. Chunky nut butters. Nuts and seeds. Beans and lentils. Tomasa Blase. Hot dogs. Sausage. Dairy High-fat cheeses. Whole milk, chocolate milk, and beverages made with milk, such as milk shakes. Half-and-half. Cream. sour cream. Ice cream. Beverages Caffeinated beverages (such as coffee, tea, soda, or energy drinks). Alcoholic beverages. Fruit juices with pulp. Prune juice. Soft drinks sweetened with high-fructose corn syrup or sugar alcohols. High-calorie sports drinks. Fats and oils Butter. Cream sauces. Margarine. Salad oils. Plain salad dressings. Olives. Avocados. Mayonnaise. Sweets and desserts Sweet rolls, doughnuts, and sweet breads. Sugar-free desserts sweetened with sugar alcohols such as xylitol and sorbitol. Seasoning and other foods Honey. Hot sauce. Chili powder. Gravy. Cream-based or milk-based soups. Pancakes and waffles. Summary  When you have diarrhea, the foods you eat and your eating habits are very important.  Make sure you get at least 8-10 cups of fluid each day, or enough to keep your urine clear or pale yellow.  Eat bland foods and gradually reintroduce healthy, nutrient-rich foods as tolerated, or as told by your health care provider.  Avoid high-fiber, fried, greasy, or spicy foods. This information is not intended to replace advice given to you by your health care provider. Make sure you discuss any questions you have with your health care provider. Document Released: 06/18/2003 Document Revised: 03/25/2016 Document Reviewed:  03/25/2016 Elsevier Interactive Patient Education  2019 [employer].  Safe Medications in Pregnancy   Acne: Benzoyl Peroxide Salicylic Acid  Backache/Headache: Tylenol: 2 regular strength every 4 hours OR              2 Extra strength every 6 hours  Colds/Coughs/Allergies: Benadryl (alcohol free) 25 mg every 6 hours as needed Breath right strips Claritin Cepacol throat lozenges Chloraseptic throat spray Cold-Eeze- up to three times per day Cough drops, alcohol free Flonase (by prescription only) Guaifenesin Mucinex Robitussin DM (plain only, alcohol free) Saline nasal spray/drops Sudafed (pseudoephedrine) & Actifed ** use only after [redacted] weeks gestation and if you do not have high blood pressure Tylenol Vicks Vaporub Zinc lozenges Zyrtec   Constipation: Colace Ducolax suppositories Fleet enema Glycerin suppositories Metamucil Milk of magnesia Miralax Senokot Smooth move tea  Diarrhea: Kaopectate Imodium A-D  *NO pepto Bismol  Hemorrhoids: Anusol Anusol HC Preparation H Tucks  Indigestion: Tums Maalox Mylanta Zantac  Pepcid  Insomnia: Benadryl (alcohol free) 25mg  every 6 hours as needed Tylenol PM Unisom, no Gelcaps  Leg Cramps: Tums MagGel  Nausea/Vomiting:  Bonine Dramamine Emetrol Ginger extract Sea bands Meclizine  Nausea medication to take during pregnancy:  Unisom (doxylamine succinate 25 mg tablets) Take one tablet daily at bedtime. If symptoms are not adequately controlled, the dose can be increased to a maximum recommended dose of two tablets daily (1/2 tablet in the morning, 1/2 tablet mid-afternoon and one at bedtime). Vitamin B6 100mg  tablets. Take one tablet twice a day (up to 200 mg per day).  Skin Rashes: Aveeno products Benadryl cream or 25mg  every 6 hours as needed Calamine Lotion 1% cortisone cream  Yeast infection: Gyne-lotrimin 7 Monistat 7   **If taking multiple medications, please check labels to avoid  duplicating the same active ingredients **take medication as directed on the label ** Do not exceed 4000 mg of tylenol in 24 hours **Do not take medications that contain aspirin or ibuprofen

## 2018-07-13 NOTE — MAU Note (Signed)
Pt C/O severe mid abdominal pain this morning, had some nausea, no vomiting.  Had two episodes of diarrhea.  Denies bleeding or LOF.  Reports good fetal movement.  Pt crying on arrival to MAU.

## 2018-07-16 LAB — GC/CHLAMYDIA PROBE AMP (~~LOC~~) NOT AT ARMC
Chlamydia: NEGATIVE
Neisseria Gonorrhea: NEGATIVE

## 2018-07-27 ENCOUNTER — Encounter (HOSPITAL_COMMUNITY): Payer: Self-pay

## 2018-07-27 ENCOUNTER — Other Ambulatory Visit: Payer: Self-pay

## 2018-07-27 ENCOUNTER — Inpatient Hospital Stay (HOSPITAL_COMMUNITY)
Admission: AD | Admit: 2018-07-27 | Discharge: 2018-07-27 | Disposition: A | Discharge status: Home / Self Care | Payer: Medicaid Other | Source: Ambulatory Visit | Attending: Obstetrics & Gynecology | Admitting: Obstetrics & Gynecology

## 2018-07-27 DIAGNOSIS — O4703 False labor before 37 completed weeks of gestation, third trimester: Secondary | ICD-10-CM | POA: Diagnosis present

## 2018-07-27 DIAGNOSIS — O09899 Supervision of other high risk pregnancies, unspecified trimester: Secondary | ICD-10-CM | POA: Insufficient documentation

## 2018-07-27 DIAGNOSIS — R8789 Other abnormal findings in specimens from female genital organs: Secondary | ICD-10-CM

## 2018-07-27 DIAGNOSIS — Z3A32 32 weeks gestation of pregnancy: Secondary | ICD-10-CM

## 2018-07-27 DIAGNOSIS — O09893 Supervision of other high risk pregnancies, third trimester: Secondary | ICD-10-CM

## 2018-07-27 LAB — FETAL FIBRONECTIN: Fetal Fibronectin: POSITIVE — AB

## 2018-07-27 LAB — URINALYSIS, ROUTINE W REFLEX MICROSCOPIC
Bilirubin Urine: NEGATIVE
Glucose, UA: NEGATIVE mg/dL
Hgb urine dipstick: NEGATIVE
Ketones, ur: NEGATIVE mg/dL
Leukocytes,Ua: NEGATIVE
Nitrite: NEGATIVE
Protein, ur: NEGATIVE mg/dL
Specific Gravity, Urine: 1.004 — ABNORMAL LOW (ref 1.005–1.030)
pH: 8 (ref 5.0–8.0)

## 2018-07-27 MED ORDER — NIFEDIPINE 10 MG PO CAPS
10.0000 mg | ORAL_CAPSULE | ORAL | Status: DC | PRN
  Administered 2018-07-27 (×2): 10 mg via ORAL
  Filled 2018-07-27 (×3): qty 1

## 2018-07-27 MED ORDER — BETAMETHASONE SOD PHOS & ACET 6 (3-3) MG/ML IJ SUSP
12.0000 mg | Freq: Once | INTRAMUSCULAR | Status: AC
  Administered 2018-07-27: 12 mg via INTRAMUSCULAR
  Filled 2018-07-27: qty 2

## 2018-07-27 NOTE — MAU Note (Signed)
Reports ctx 10-15 mins apart starting at 2200, now every 5-10 mins.  No LOF/VB.  Was almost 2 cm last week in the office.  + FM.

## 2018-07-27 NOTE — MAU Provider Note (Signed)
Chief Complaint:  Contractions   First Provider Initiated Contact with Patient 07/27/18 [phone]      HPI: Haley Lopez is a 23 y.o. [id number] at 69w1dwho presents to maternity admissions reporting uterine contractions.  Last IC was Tuesday.  Lives in [place] but in process of moving here, so visits frequently.  Hx PTL in past pregnancy.. She reports good fetal movement, denies LOF, vaginal bleeding, vaginal itching/burning, urinary symptoms, h/a, dizziness, n/v, diarrhea, constipation or fever/chills.  .  RN note: Reports ctx 10-15 mins apart starting at 2200, now every 5-10 mins.  No LOF/VB.  Was almost 2 cm last week in the office.  + FM  Past Medical History: Past Medical History:  Diagnosis Date  . Anemia   . Anxiety   . Constipation   . Depression   . Vision abnormalities     Past obstetric history: OB History  Gravida Para Term Preterm AB Living  5 1 0 1 3 1   SAB TAB Ectopic Multiple Live Births  3 0 0 0 1    # Outcome Date GA Lbr Len/2nd Weight Sex Delivery Anes PTL Lv  5 Current           4 Preterm 04/08/15 110w6d 05:42 / 00:15 1720 g F Vag-Spont EPI  LIV  3 SAB           2 SAB           1 SAB             Past Surgical History: Past Surgical History:  Procedure Laterality Date  . DILATION AND EVACUATION N/A 04/21/2016   Procedure: DILATATION AND EVACUATION;  Surgeon: Lavina Hamman, MD;  Location: [facility];  Service: Gynecology;  Laterality: N/A;  . WISDOM TOOTH EXTRACTION      Family History: Family History  Problem Relation Age of Onset  . Depression Mother     Social History: Social History   Tobacco Use  . Smoking status: Never Smoker  . Smokeless tobacco: Never Used  Substance Use Topics  . Alcohol use: Not Currently    Comment: social  . Drug use: Not Currently    Types: Marijuana    Comment: last use 2 days ago 10/7    Allergies: No Known Allergies  Meds:  Medications Prior to Admission  Medication Sig Dispense Refill Last Dose  . ferrous  sulfate 325 (65 FE) MG tablet Take 325 mg by mouth daily with breakfast.   07/12/2018 at Unknown time  . ondansetron (ZOFRAN ODT) 8 MG disintegrating tablet Take 1 tablet (8 mg total) by mouth every 8 (eight) hours as needed for nausea or vomiting. 20 tablet 0   . Prenatal Multivit-Min-Fe-FA (PRENATAL VITAMINS) 0.8 MG tablet Take 1 tablet by mouth daily. 90 tablet 12 07/12/2018 at Unknown time    I have reviewed patient's Past Medical Hx, Surgical Hx, Family Hx, Social Hx, medications and allergies.   ROS:  Review of Systems  Constitutional: Negative for chills and fever.  Gastrointestinal: Positive for abdominal pain (mild ). Negative for constipation, diarrhea and nausea.  Genitourinary: Negative for vaginal bleeding and vaginal discharge.   Other systems negative  Physical Exam   Patient Vitals for the past 24 hrs:  BP Temp Pulse Resp SpO2 Height Weight  07/27/18 0514 (!) 106/58 98.3 F (36.8 C) 81 17 100 % - -  07/27/18 0512 - - - - - 5' (1.524 m) 53.2 kg   Constitutional: Well-developed, well-nourished female in no acute distress.  Cardiovascular: normal rate and rhythm Respiratory: normal effort, clear to auscultation bilaterally GI: Abd soft, non-tender, gravid appropriate for gestational age.   No rebound or guarding. MS: Extremities nontender, no edema, normal ROM Neurologic: Alert and oriented x 4.  GU: Neg CVAT.  PELVIC EXAM:  Dilation: 1 Effacement (%): 20 Station: -3, Ballotable Exam by:: Wynelle BourgeoisMarie Jalyiah Shelley, CNM  Recheck 2 hours later: Dilation: 1 Effacement (%): 20 Station: -3, Ballotable Exam by:: Wynelle BourgeoisMarie Corissa Oguinn, CNM   FHT:  Baseline 140 , moderate variability, accelerations present, no decelerations Contractions: q 2-3 mins Irregular    Labs: Results for orders placed or performed during the hospital encounter of 07/27/18 (from the past 24 hour(s))  Fetal fibronectin     Status: Abnormal   Collection Time: 07/27/18  5:37 AM  Result Value Ref Range   Fetal  Fibronectin POSITIVE (A) NEGATIVE  Urinalysis, Routine w reflex microscopic     Status: Abnormal   Collection Time: 07/27/18  5:45 AM  Result Value Ref Range   Color, Urine STRAW (A) YELLOW   APPearance CLEAR CLEAR   Specific Gravity, Urine 1.004 (L) 1.005 - 1.030   pH 8.0 5.0 - 8.0   Glucose, UA NEGATIVE NEGATIVE mg/dL   Hgb urine dipstick NEGATIVE NEGATIVE   Bilirubin Urine NEGATIVE NEGATIVE   Ketones, ur NEGATIVE NEGATIVE mg/dL   Protein, ur NEGATIVE NEGATIVE mg/dL   Nitrite NEGATIVE NEGATIVE   Leukocytes,Ua NEGATIVE NEGATIVE     Imaging:  No results found.  MAU Course/MDM: I have ordered labs and reviewed results.  Urine shows she is well hydrated NST reviewed, reassuring with frequent irritability/contractions .  Treatments in MAU included Procardia series x 2 (BP too low for 3rd),  Betamethasone.   Did slow contractions Cervix unchanged after 2 hours Consulted Dr Macon LargeAnyanwu, ok to discharge.  Return tomorrow [facility] Betamethasone [facility]  Assessment: SIngle intrauterine pregnancy at 1462w1d Preterm uterine contractions Positive fetal fibronectin  Plan: DIscharge home Return tomorrow for second B-meth [place] for [place] PTL precautions Encouraged to return here or to other Urgent Care/ED if she develops worsening of symptoms, increase in pain, fever, or other concerning symptoms.       Wynelle BourgeoisMarie Saverio Kader CNM, MSN Certified Nurse-Midwife 07/27/2018 5:23 AM

## 2018-07-27 NOTE — Discharge Instructions (Signed)
Betamethasone; Calcipotriene scalp suspension What is this medicine? BETAMETHASONE; CALCIPOTRIENE (bay ta METH a sone; kal si poe TRYE een) is used to treat psoriasis on the scalp and body. It helps reduce skin redness, thickness, and scaling. This medicine may be used for other purposes; ask your health care provider or pharmacist if you have questions. COMMON BRAND NAME(S): Taclonex, Taclonex Scalp What should I tell my health care provider before I take this medicine? They need to know if you have any of these conditions: -erythrodermic, exfoliative, or pustular psoriasis -getting light therapy for psoriasis -high levels of calcium in the blood -high levels of Vitamin D in the blood -skin infection or thinning -an unusual reaction to betamethasone, calcipotriene, other medicines, foods, dyes, or preservatives -pregnant or trying to get pregnant -breast-feeding How should I use this medicine? This medicine is for external use only. Do not take by mouth. Follow the directions on the prescription label. Wash your hands before and after use. Shake the bottle before use. If using on scalp, it is not necessary to wash your hair before applying this medicine. It may help to part your hair before using this medicine. Apply directly to the affected area of the scalp or body and gently rub in with your fingertips. Do not wash your hair or take a bath or shower right away after applying. Do not cover with a bandage or dressing unless your doctor or health care professional tells you to. Do not use on healthy skin or over large areas of skin. Do not use on face, groin, or underarms. Do not get this medicine in your eyes. If you do, rinse out with plenty of cool tap water. It is important not to use more medicine than prescribed. Do not use your medicine more often than directed. Talk to your pediatrician regarding the use of this medicine in children. While this drug may be prescribed for children as young  as 12 years for selected conditions, precautions do apply. Overdosage: If you think you have taken too much of this medicine contact a poison control center or emergency room at once. NOTE: This medicine is only for you. Do not share this medicine with others. What if I miss a dose? If you miss a dose, take it as soon as you can. If it is almost time for your next dose, take only that dose. Do not take double or extra doses. What may interact with this medicine? -other steroid medicines, like hydrocortisone or prednisone Do not use any other skin products on the same area of skin without asking your doctor or health care professional. This list may not describe all possible interactions. Give your health care provider a list of all the medicines, herbs, non-prescription drugs, or dietary supplements you use. Also tell them if you smoke, drink alcohol, or use illegal drugs. Some items may interact with your medicine. What should I watch for while using this medicine? You may need blood work done while you are taking this medicine. Tell your doctor or health care professional if your symptoms do not start to get better or if they get worse. Tell your doctor or health care professional if you are exposed to anyone with measles or chickenpox, or if you develop sores or blisters that do not heal properly. This medicine can make you more sensitive to the sun. Keep out of the sun. If you cannot avoid being in the sun, wear protective clothing and use sunscreen. Do not use sun lamps or  tanning beds/booths. Do not apply this medicine 12 hours before or after any chemical treatments to the hair. Check with your doctor or health care professional before receiving any chemical treatments. What side effects may I notice from receiving this medicine? Side effects that you should report to your doctor or health care professional as soon as possible: - allergic reactions like skin rash, itching or hives, swelling  of the face, lips, or tongue -fever -skin infection or thinning -worsening of psoriasis Side effects that usually do not require medical attention (report to your doctor or health care professional if they continue or are bothersome): -change of skin color, lighter or darker -skin irritation, itching, redness, or stinging This list may not describe all possible side effects. Call your doctor for medical advice about side effects. You may report side effects to FDA at 1-800-FDA-1088. Where should I keep my medicine? Keep out of the reach of children. Store at room temperature between 20 and 25 degrees C (68 and 77 degrees F); do not refrigerate. Keep the bottle in the carton when not in use. Throw away any unused medicine after the expiration date or within 6 months after opening, whichever comes first. NOTE: This sheet is a summary. It may not cover all possible information. If you have questions about this medicine, talk to your doctor, pharmacist, or health care provider.  2019 Elsevier/Gold Standard (2014-11-12 15:25:00) Preterm Labor and Birth Information Pregnancy normally lasts 39-41 weeks. Preterm labor is when labor starts early. It starts before you have been pregnant for 37 whole weeks. What are the risk factors for preterm labor? Preterm labor is more likely to occur in women who:  Have an infection while pregnant.  Have a cervix that is short.  Have gone into preterm labor before.  Have had surgery on their cervix.  Are younger than age 50.  Are older than age 69.  Are African American.  Are pregnant with two or more babies.  Take street drugs while pregnant.  Smoke while pregnant.  Do not gain enough weight while pregnant.  Got pregnant right after another pregnancy. What are the symptoms of preterm labor? Symptoms of preterm labor include:  Cramps. The cramps may feel like the cramps some women get during their period. The cramps may happen with watery poop  (diarrhea).  Pain in the belly (abdomen).  Pain in the lower back.  Regular contractions or tightening. It may feel like your belly is getting tighter.  Pressure in the lower belly that seems to get stronger.  More fluid (discharge) leaking from the vagina. The fluid may be watery or bloody.  Water breaking. Why is it important to notice signs of preterm labor? Babies who are born early may not be fully developed. They have a higher chance for:  Long-term heart problems.  Long-term lung problems.  Trouble controlling body systems, like breathing.  Bleeding in the brain.  A condition called cerebral palsy.  Learning difficulties.  Death. These risks are highest for babies who are born before 34 weeks of pregnancy. How is preterm labor treated? Treatment depends on:  How long you were pregnant.  Your condition.  The health of your baby. Treatment may involve:  Having a stitch (suture) placed in your cervix. When you give birth, your cervix opens so the baby can come out. The stitch keeps the cervix from opening too soon.  Staying at the hospital.  Taking or getting medicines, such as: ? Hormone medicines. ? Medicines to stop  contractions. ? Medicines to help the babys lungs develop. ? Medicines to prevent your baby from having cerebral palsy. What should I do if I am in preterm labor? If you think you are going into labor too soon, call your doctor right away. How can I prevent preterm labor?  Do not use any tobacco products. ? Examples of these are cigarettes, chewing tobacco, and e-cigarettes. ? If you need help quitting, ask your doctor.  Do not use street drugs.  Do not use any medicines unless you ask your doctor if they are safe for you.  Talk with your doctor before taking any herbal supplements.  Make sure you gain enough weight.  Watch for infection. If you think you might have an infection, get it checked right away.  If you have gone into  preterm labor before, tell your doctor. This information is not intended to replace advice given to you by your health care provider. Make sure you discuss any questions you have with your health care provider. Document Released: 06/24/2008 Document Revised: 09/08/2015 Document Reviewed: 08/19/2015 Elsevier Interactive Patient Education  2019 [employer]

## 2018-07-28 ENCOUNTER — Encounter (HOSPITAL_COMMUNITY): Payer: Self-pay

## 2018-07-28 ENCOUNTER — Inpatient Hospital Stay (HOSPITAL_COMMUNITY)
Admission: EM | Admit: 2018-07-28 | Discharge: 2018-07-28 | Disposition: A | Discharge status: Home / Self Care | Payer: Medicaid Other | Attending: Obstetrics and Gynecology | Admitting: Obstetrics and Gynecology

## 2018-07-28 ENCOUNTER — Other Ambulatory Visit: Payer: Self-pay

## 2018-07-28 DIAGNOSIS — D649 Anemia, unspecified: Secondary | ICD-10-CM | POA: Insufficient documentation

## 2018-07-28 DIAGNOSIS — R109 Unspecified abdominal pain: Secondary | ICD-10-CM | POA: Diagnosis not present

## 2018-07-28 DIAGNOSIS — O09893 Supervision of other high risk pregnancies, third trimester: Secondary | ICD-10-CM | POA: Diagnosis not present

## 2018-07-28 DIAGNOSIS — R8789 Other abnormal findings in specimens from female genital organs: Secondary | ICD-10-CM | POA: Diagnosis not present

## 2018-07-28 DIAGNOSIS — Z3A32 32 weeks gestation of pregnancy: Secondary | ICD-10-CM | POA: Diagnosis not present

## 2018-07-28 DIAGNOSIS — O4703 False labor before 37 completed weeks of gestation, third trimester: Secondary | ICD-10-CM | POA: Insufficient documentation

## 2018-07-28 DIAGNOSIS — O26893 Other specified pregnancy related conditions, third trimester: Secondary | ICD-10-CM | POA: Insufficient documentation

## 2018-07-28 DIAGNOSIS — O99013 Anemia complicating pregnancy, third trimester: Secondary | ICD-10-CM | POA: Insufficient documentation

## 2018-07-28 LAB — URINALYSIS, ROUTINE W REFLEX MICROSCOPIC
Bilirubin Urine: NEGATIVE
Glucose, UA: NEGATIVE mg/dL
Hgb urine dipstick: NEGATIVE
Ketones, ur: NEGATIVE mg/dL
Leukocytes,Ua: NEGATIVE
Nitrite: NEGATIVE
Protein, ur: NEGATIVE mg/dL
Specific Gravity, Urine: 1.011 (ref 1.005–1.030)
pH: 8 (ref 5.0–8.0)

## 2018-07-28 MED ORDER — BETAMETHASONE SOD PHOS & ACET 6 (3-3) MG/ML IJ SUSP
12.0000 mg | Freq: Once | INTRAMUSCULAR | Status: AC
  Administered 2018-07-28: 10:00:00 12 mg via INTRAMUSCULAR
  Filled 2018-07-28: qty 2

## 2018-07-28 NOTE — Discharge Instructions (Signed)
Preterm Labor and Birth Information ° °The normal length of a pregnancy is 39-41 weeks. Preterm labor is when labor starts before 37 completed weeks of pregnancy. °What are the risk factors for preterm labor? °Preterm labor is more likely to occur in women who: °· Have certain infections during pregnancy such as a bladder infection, sexually transmitted infection, or infection inside the uterus (chorioamnionitis). °· Have a shorter-than-normal cervix. °· Have gone into preterm labor before. °· Have had surgery on their cervix. °· Are younger than age 17 or older than age 35. °· Are African American. °· Are pregnant with twins or multiple babies (multiple gestation). °· Take street drugs or smoke while pregnant. °· Do not gain enough weight while pregnant. °· Became pregnant shortly after having been pregnant. °What are the symptoms of preterm labor? °Symptoms of preterm labor include: °· Cramps similar to those that can happen during a menstrual period. The cramps may happen with diarrhea. °· Pain in the abdomen or lower back. °· Regular uterine contractions that may feel like tightening of the abdomen. °· A feeling of increased pressure in the pelvis. °· Increased watery or bloody mucus discharge from the vagina. °· Water breaking (ruptured amniotic sac). °Why is it important to recognize signs of preterm labor? °It is important to recognize signs of preterm labor because babies who are born prematurely may not be fully developed. This can put them at an increased risk for: °· Long-term (chronic) heart and lung problems. °· Difficulty immediately after birth with regulating body systems, including blood sugar, body temperature, heart rate, and breathing rate. °· Bleeding in the brain. °· Cerebral palsy. °· Learning difficulties. °· Death. °These risks are highest for babies who are born before 34 weeks of pregnancy. °How is preterm labor treated? °Treatment depends on the length of your pregnancy, your condition,  and the health of your baby. It may involve: °· Having a stitch (suture) placed in your cervix to prevent your cervix from opening too early (cerclage). °· Taking or being given medicines, such as: °? Hormone medicines. These may be given early in pregnancy to help support the pregnancy. °? Medicine to stop contractions. °? Medicines to help mature the baby’s lungs. These may be prescribed if the risk of delivery is high. °? Medicines to prevent your baby from developing cerebral palsy. °If the labor happens before 34 weeks of pregnancy, you may need to stay in the hospital. °What should I do if I think I am in preterm labor? °If you think that you are going into preterm labor, call your health care provider right away. °How can I prevent preterm labor in future pregnancies? °To increase your chance of having a full-term pregnancy: °· Do not use any tobacco products, such as cigarettes, chewing tobacco, and e-cigarettes. If you need help quitting, ask your health care provider. °· Do not use street drugs or medicines that have not been prescribed to you during your pregnancy. °· Talk with your health care provider before taking any herbal supplements, even if you have been taking them regularly. °· Make sure you gain a healthy amount of weight during your pregnancy. °· Watch for infection. If you think that you might have an infection, get it checked right away. °· Make sure to tell your health care provider if you have gone into preterm labor before. °This information is not intended to replace advice given to you by your health care provider. Make sure you discuss any questions you have with your   infection, get it checked right away.   Make sure to tell your health care provider if you have gone into preterm labor before.  This information is not intended to replace advice given to you by your health care provider. Make sure you discuss any questions you have with your health care provider.  Document Released: 06/18/2003 Document Revised: 09/08/2015 Document Reviewed: 08/19/2015  Elsevier Interactive Patient Education  2019 Elsevier Inc.    Fetal Movement Counts  Patient Name: ________________________________________________ Patient Due Date: ____________________  What is a fetal  movement count?    A fetal movement count is the number of times that you feel your baby move during a certain amount of time. This may also be called a fetal kick count. A fetal movement count is recommended for every pregnant woman. You may be asked to start counting fetal movements as early as week 28 of your pregnancy.  Pay attention to when your baby is most active. You may notice your baby's sleep and wake cycles. You may also notice things that make your baby move more. You should do a fetal movement count:   When your baby is normally most active.   At the same time each day.  A good time to count movements is while you are resting, after having something to eat and drink.  How do I count fetal movements?  1. Find a quiet, comfortable area. Sit, or lie down on your side.  2. Write down the date, the start time and stop time, and the number of movements that you felt between those two times. Take this information with you to your health care visits.  3. For 2 hours, count kicks, flutters, swishes, rolls, and jabs. You should feel at least 10 movements during 2 hours.  4. You may stop counting after you have felt 10 movements.  5. If you do not feel 10 movements in 2 hours, have something to eat and drink. Then, keep resting and counting for 1 hour. If you feel at least 4 movements during that hour, you may stop counting.  Contact a health care provider if:   You feel fewer than 4 movements in 2 hours.   Your baby is not moving like he or she usually does.  Date: ____________ Start time: ____________ Stop time: ____________ Movements: ____________  Date: ____________ Start time: ____________ Stop time: ____________ Movements: ____________  Date: ____________ Start time: ____________ Stop time: ____________ Movements: ____________  Date: ____________ Start time: ____________ Stop time: ____________ Movements: ____________  Date: ____________ Start time: ____________ Stop time: ____________ Movements:  ____________  Date: ____________ Start time: ____________ Stop time: ____________ Movements: ____________  Date: ____________ Start time: ____________ Stop time: ____________ Movements: ____________  Date: ____________ Start time: ____________ Stop time: ____________ Movements: ____________  Date: ____________ Start time: ____________ Stop time: ____________ Movements: ____________  This information is not intended to replace advice given to you by your health care provider. Make sure you discuss any questions you have with your health care provider.  Document Released: 04/27/2006 Document Revised: 11/25/2015 Document Reviewed: 05/07/2015  Elsevier Interactive Patient Education  2019 Elsevier Inc.

## 2018-07-28 NOTE — MAU Note (Signed)
Haley Lopez is a 23 y.o. at [redacted]w[redacted]d here in MAU reporting: here for 2nd BMZ injection. States she is still having uc's and they feel tighter then when she was here yesterday. No bleeding, no LOF, + FM  Pain score: 7/10  Vitals:   07/28/18 0921  BP: 104/62  Pulse: (!) 112  Resp: 18  Temp: 98.5 F (36.9 C)  SpO2: 100%      FHT: + FM  Lab orders placed from triage: [facility]

## 2018-07-28 NOTE — MAU Provider Note (Signed)
Chief Complaint:  Contractions and BMZ injection   First Provider Initiated Contact with Patient 07/28/18 0946     HPI: Haley Lopez is a 23 y.o. [id number] at 5149w2d who presents to maternity admissions reporting contractions. Was seen in MAU yesterday with same complaint. Cervix 1 cm dilated without change. FFN positive yesterday. Pt returns today for 2nd dose of BMZ.  Reports contractions returned to previous frequency & now feels every 5-7 minutes. States they are more tightening than painful. Denies LOF or vaginal bleeding. Normal fetal movement.   Location: abdomen Quality: tightening, pressure Severity: 7/10 in pain scale Duration: 1 day Timing: every 5-7 minutes Modifying factors: none Associated signs and symptoms: none  Past Medical History:  Diagnosis Date  . Anemia   . Anxiety   . Constipation   . Depression   . Vision abnormalities    OB History  Gravida Para Term Preterm AB Living  5 1 0 1 3 1   SAB TAB Ectopic Multiple Live Births  3 0 0 0 1    # Outcome Date GA Lbr Len/2nd Weight Sex Delivery Anes PTL Lv  5 Current           4 SAB 04/21/16 1366w0d   U         Birth Comments: Needed D&E for missed abortion      Complications: Missed abortion  3 Preterm 04/08/15 1864w6d 05:42 / 00:15 1720 g F Vag-Spont EPI  LIV     Birth Comments: Dr Ambrose MantleHenley documented 6873w3d  2 SAB           1 SAB            Past Surgical History:  Procedure Laterality Date  . DILATION AND EVACUATION N/A 04/21/2016   Procedure: DILATATION AND EVACUATION;  Surgeon: Lavina Hammanodd Meisinger, MD;  Location: [facility];  Service: Gynecology;  Laterality: N/A;  . WISDOM TOOTH EXTRACTION     Family History  Problem Relation Age of Onset  . Depression Mother    Social History   Tobacco Use  . Smoking status: Never Smoker  . Smokeless tobacco: Never Used  Substance Use Topics  . Alcohol use: Not Currently    Comment: social  . Drug use: Not Currently    Comment: history of marijuana use in past   No Known  Allergies Medications Prior to Admission  Medication Sig Dispense Refill Last Dose  . ferrous sulfate 325 (65 FE) MG tablet Take 325 mg by mouth daily with breakfast.   07/26/2018 at Unknown time  . ondansetron (ZOFRAN ODT) 8 MG disintegrating tablet Take 1 tablet (8 mg total) by mouth every 8 (eight) hours as needed for nausea or vomiting. 20 tablet 0   . Prenatal Multivit-Min-Fe-FA (PRENATAL VITAMINS) 0.8 MG tablet Take 1 tablet by mouth daily. 90 tablet 12 07/26/2018 at Unknown time    I have reviewed patient's Past Medical Hx, Surgical Hx, Family Hx, Social Hx, medications and allergies.   ROS:  Review of Systems  Constitutional: Negative.   Gastrointestinal: Positive for abdominal pain. Negative for constipation, diarrhea, nausea and vomiting.  Genitourinary: Negative.     Physical Exam   Patient Vitals for the past 24 hrs:  BP Temp Temp src Pulse Resp SpO2 Height Weight  07/28/18 0921 104/62 98.5 F (36.9 C) Oral (!) 112 18 100 % - -  07/28/18 [id number] - - - - - - 5' (1.524 m) 53.2 kg    Constitutional: Well-developed, well-nourished female in no acute distress.  Cardiovascular: normal rate & rhythm, no murmur Respiratory: normal effort, lung sounds clear throughout GI: Abd soft, non-tender, gravid appropriate for gestational age. Pos BS x 4 MS: Extremities nontender, no edema, normal ROM Neurologic: Alert and oriented x 4.  GU:      Pelvic: NEFG, physiologic discharge, no blood, cervix clean.   Dilation: 1 Effacement (%): Thick Exam by:: Judeth Horn NP   .cer NST:  Baseline: 135 bpm, Variability: Good {> 6 bpm), Accelerations: Reactive and Decelerations: Absent   Labs: Results for orders placed or performed during the hospital encounter of 07/28/18 (from the past 24 hour(s))  Urinalysis, Routine w reflex microscopic     Status: None   Collection Time: 07/28/18  9:30 AM  Result Value Ref Range   Color, Urine YELLOW YELLOW   APPearance CLEAR CLEAR   Specific Gravity,  Urine 1.011 1.005 - 1.030   pH 8.0 5.0 - 8.0   Glucose, UA NEGATIVE NEGATIVE mg/dL   Hgb urine dipstick NEGATIVE NEGATIVE   Bilirubin Urine NEGATIVE NEGATIVE   Ketones, ur NEGATIVE NEGATIVE mg/dL   Protein, ur NEGATIVE NEGATIVE mg/dL   Nitrite NEGATIVE NEGATIVE   Leukocytes,Ua NEGATIVE NEGATIVE    Imaging:  No results found.  MAU Course: Orders Placed This Encounter  Procedures  . Urinalysis, Routine w reflex microscopic  . Discharge patient   Meds ordered this encounter  Medications  . betamethasone acetate-betamethasone sodium phosphate (CELESTONE) injection 12 mg    MDM: Irregular ctx on monitor, 3 in 1 hour.  Cervix 1/thick/-3 (same as yesterday) & unchanged after 1+ hour of monitoring.  Pt received 2nd dose of BMZ today Has ob appt in [place] on Monday, then plans to transfer care to [facility]  Assessment: 1. Preterm uterine contractions in third trimester, antepartum   2. [redacted] weeks gestation of pregnancy     Plan: Discharge home in stable condition.  Preterm Labor precautions and fetal kick counts   Allergies as of 07/28/2018   No Known Allergies     Medication List    TAKE these medications   ferrous sulfate 325 (65 FE) MG tablet Take 325 mg by mouth daily with breakfast.   ondansetron 8 MG disintegrating tablet Commonly known as:  Zofran ODT Take 1 tablet (8 mg total) by mouth every 8 (eight) hours as needed for nausea or vomiting.   Prenatal Vitamins 0.8 MG tablet Take 1 tablet by mouth daily.       Judeth Horn, NP 07/28/2018 11:14 AM

## 2018-07-31 ENCOUNTER — Telehealth: Payer: Self-pay | Admitting: Obstetrics and Gynecology

## 2018-07-31 NOTE — Telephone Encounter (Signed)
The patient called in to schedule an appointment. She stated she is 33 weeks and received care at a clinic about 3 hours away. She also stated she completed 17p injections and its time for a shot. Informed the patient of our fax number to have her records faxed over. She stated she called the [facility] office and they told her she would have to start off her care with our clinic and be transferred over. Informed the patient once we received the records we will give her call.

## 2018-08-15 ENCOUNTER — Encounter (HOSPITAL_COMMUNITY): Payer: Self-pay | Admitting: *Deleted

## 2018-08-15 ENCOUNTER — Inpatient Hospital Stay (HOSPITAL_COMMUNITY)
Admission: AD | Admit: 2018-08-15 | Discharge: 2018-08-15 | Disposition: A | Discharge status: Home / Self Care | Payer: Medicaid Other | Attending: Obstetrics & Gynecology | Admitting: Obstetrics & Gynecology

## 2018-08-15 ENCOUNTER — Other Ambulatory Visit: Payer: Self-pay

## 2018-08-15 DIAGNOSIS — O09213 Supervision of pregnancy with history of pre-term labor, third trimester: Secondary | ICD-10-CM | POA: Diagnosis not present

## 2018-08-15 DIAGNOSIS — O26893 Other specified pregnancy related conditions, third trimester: Secondary | ICD-10-CM | POA: Diagnosis not present

## 2018-08-15 DIAGNOSIS — N898 Other specified noninflammatory disorders of vagina: Secondary | ICD-10-CM | POA: Insufficient documentation

## 2018-08-15 DIAGNOSIS — Z3A34 34 weeks gestation of pregnancy: Secondary | ICD-10-CM | POA: Diagnosis not present

## 2018-08-15 DIAGNOSIS — O4703 False labor before 37 completed weeks of gestation, third trimester: Secondary | ICD-10-CM | POA: Insufficient documentation

## 2018-08-15 NOTE — MAU Provider Note (Signed)
Chief Complaint:  Contractions and Vaginal Discharge   None      HPI: Haley Lopez is a 23 y.o. [id number] at 2729w6d with hx preterm birth at 5835 weeks who presents to maternity admissions reporting mucous when wiping today and some increased cramping. She has hx of preterm labor and wants to have things checked out. She saw thick mucous several times when wiping today, without any bleeding, itching, or burning. She reports cramping x 2 weeks with some increase today, less than 4-5 times per hour and mild in intensity.  [facility]. She has been getting 17-P injections weekly throughout this pregnancy.  She reports good fetal movement.  HPI  Past Medical History: Past Medical History:  Diagnosis Date  . Anemia   . Anxiety   . Constipation   . Depression   . Vision abnormalities     Past obstetric history: OB History  Gravida Para Term Preterm AB Living  5 1 0 1 3 1   SAB TAB Ectopic Multiple Live Births  3 0 0 0 1    # Outcome Date GA Lbr Len/2nd Weight Sex Delivery Anes PTL Lv  5 Current           4 SAB 04/21/16 8696w0d   U         Birth Comments: Needed D&E for missed abortion      Complications: Missed abortion  3 Preterm 04/08/15 6334w6d 05:42 / 00:15 1720 g F Vag-Spont EPI  LIV     Birth Comments: Dr Ambrose MantleHenley documented [redacted]w[redacted]d  2 SAB           1 SAB             Past Surgical History: Past Surgical History:  Procedure Laterality Date  . DILATION AND EVACUATION N/A 04/21/2016   Procedure: DILATATION AND EVACUATION;  Surgeon: Lavina Hammanodd Meisinger, MD;  Location: [facility];  Service: Gynecology;  Laterality: N/A;  . WISDOM TOOTH EXTRACTION      Family History: Family History  Problem Relation Age of Onset  . Depression Mother     Social History: Social History   Tobacco Use  . Smoking status: Never Smoker  . Smokeless tobacco: Never Used  Substance Use Topics  . Alcohol use: Not Currently    Comment:  social  . Drug use: Not Currently    Comment: history of marijuana use in past    Allergies: No Known Allergies  Meds:  No medications prior to admission.    ROS:  Review of Systems  Constitutional: Negative for chills, fatigue and fever.  Eyes: Negative for visual disturbance.  Respiratory: Negative for shortness of breath.   Cardiovascular: Negative for chest pain.  Gastrointestinal: Positive for abdominal pain. Negative for nausea and vomiting.  Genitourinary: Positive for vaginal discharge. Negative for difficulty urinating, dysuria, flank pain, pelvic pain, vaginal bleeding and vaginal pain.  Neurological: Negative for dizziness and headaches.  Psychiatric/Behavioral: Negative.      I have reviewed patient's Past Medical Hx, Surgical Hx, Family Hx, Social Hx, medications and allergies.   Physical Exam   Patient Vitals for the past 24 hrs:  BP Temp Pulse Resp Height Weight  08/15/18 0945 (!) 100/57 98.1 F (36.7 C) 81 18 5' (1.524 m) 54 kg   Constitutional: Well-developed, well-nourished female in no acute distress.  Cardiovascular: normal rate Respiratory: normal effort GI: Abd soft, non-tender, gravid appropriate for gestational age.  MS: Extremities  nontender, no edema, normal ROM Neurologic: Alert and oriented x 4.  GU: Neg CVAT.   Dilation: 1 Effacement (%): Thick Cervical Position: Posterior Station: Ballotable Presentation: Vertex Exam by:: L.Leftwich-Kirby,CNM  FHT:  Baseline 135 , moderate variability, accelerations present, no decelerations Contractions: None on toco or to palpation   Labs: No results found for this or any previous visit (from the past 24 hour(s)).    Imaging:  No results found.  MAU Course/MDM: Orders Placed This Encounter  Procedures  . Discharge patient    No orders of the defined types were placed in this encounter.    NST reviewed and reactive No evidence of preterm labor with cervix 1 cm/long on exam.  Offered pt  opportunity to stay in MAU for recheck of cervix in 1 hour or discharge now and return if symptoms worsen. Pt desires discharge. Preterm labor precautions/reasons to return reviewed. Pt to keep appt tomorrow with Dr Earlene Plater.     Assessment: 1. Threatened preterm labor, third trimester     Plan: Discharge home Labor precautions and fetal kick counts  Follow-up Information    [facility] Follow up.   Why:  Tomorrow, 08/16/18, as scheduled. Return to MAU with signs of labor or emergencies. Contact information: [address] [place] [address] [phone]       [facility] .   Specialty:  Obstetrics and Gynecology Contact information: [address] [place] [address] [phone]         Allergies as of 08/15/2018   No Known Allergies     Medication List    TAKE these medications   ferrous sulfate 325 (65 FE) MG tablet Take 325 mg by mouth daily with breakfast.   ondansetron 8 MG disintegrating tablet Commonly known as:  Zofran ODT Take 1 tablet (8 mg total) by mouth every 8 (eight) hours as needed for nausea or vomiting.   Prenatal Vitamins 0.8 MG tablet Take 1 tablet by mouth daily.       Sharen Counter Certified Nurse-Midwife 08/15/2018 3:05 PM

## 2018-08-15 NOTE — MAU Note (Signed)
Pt stated she has been having ctx about q 7 min since 8am. Went to BR and wiped and had a large amount of clear thick mucus come out. Thought it might be her mucus plug.  No bleeding and good fetal movement reported.

## 2018-08-16 ENCOUNTER — Other Ambulatory Visit: Payer: Self-pay

## 2018-08-16 ENCOUNTER — Ambulatory Visit (INDEPENDENT_AMBULATORY_CARE_PROVIDER_SITE_OTHER): Payer: Medicaid Other | Admitting: Obstetrics and Gynecology

## 2018-08-16 ENCOUNTER — Encounter: Payer: Self-pay | Admitting: Obstetrics and Gynecology

## 2018-08-16 VITALS — BP 118/79 | HR 86 | Wt 121.4 lb

## 2018-08-16 DIAGNOSIS — O26843 Uterine size-date discrepancy, third trimester: Secondary | ICD-10-CM | POA: Diagnosis not present

## 2018-08-16 DIAGNOSIS — O0993 Supervision of high risk pregnancy, unspecified, third trimester: Secondary | ICD-10-CM | POA: Diagnosis not present

## 2018-08-16 DIAGNOSIS — Z8619 Personal history of other infectious and parasitic diseases: Secondary | ICD-10-CM | POA: Insufficient documentation

## 2018-08-16 DIAGNOSIS — O0990 Supervision of high risk pregnancy, unspecified, unspecified trimester: Secondary | ICD-10-CM

## 2018-08-16 DIAGNOSIS — Z3A34 34 weeks gestation of pregnancy: Secondary | ICD-10-CM

## 2018-08-16 DIAGNOSIS — O09213 Supervision of pregnancy with history of pre-term labor, third trimester: Secondary | ICD-10-CM

## 2018-08-16 DIAGNOSIS — B009 Herpesviral infection, unspecified: Secondary | ICD-10-CM

## 2018-08-16 DIAGNOSIS — O26849 Uterine size-date discrepancy, unspecified trimester: Secondary | ICD-10-CM

## 2018-08-16 DIAGNOSIS — Z8751 Personal history of pre-term labor: Secondary | ICD-10-CM

## 2018-08-16 MED ORDER — HYDROXYPROGESTERONE CAPROATE 275 MG/1.1ML ~~LOC~~ SOAJ
275.0000 mg | Freq: Once | SUBCUTANEOUS | Status: AC
  Administered 2018-08-16: 275 mg via SUBCUTANEOUS

## 2018-08-16 MED ORDER — VALACYCLOVIR HCL 500 MG PO TABS: 500.0000 mg | ORAL_TABLET | Freq: Two times a day (BID) | ORAL | 6 refills | Status: DC

## 2018-08-16 NOTE — Progress Notes (Signed)
Pt is here for initial OB visit, transferring care. [redacted]w[redacted]d. Pt states she missed her last 17P injection.

## 2018-08-16 NOTE — Progress Notes (Signed)
INITIAL PRENATAL VISIT NOTE  Subjective:  Haley Lopez is a 23 y.o. [id number] at [redacted]w[redacted]d by LMP being seen today for her initial prenatal visit. She is transferring from [place] where she has had regular care.  This is an unplanned pregnancy. She and partner are happy with the pregnancy. She was using nothing for birth control previously. She has an obstetric history significant for PTD at 35 weeks. She has a medical history significant for n/a.  Patient reports fatigue and occasional contractions.  Contractions: Irregular. Vag. Bleeding: None.  Movement: Present. Denies leaking of fluid.    Past Medical History:  Diagnosis Date  . Anemia   . Anxiety   . Constipation   . Depression   . Vision abnormalities     Past Surgical History:  Procedure Laterality Date  . DILATION AND EVACUATION N/A 04/21/2016   Procedure: DILATATION AND EVACUATION;  Surgeon: Lavina Hamman, MD;  Location: [facility];  Service: Gynecology;  Laterality: N/A;  . WISDOM TOOTH EXTRACTION      OB History  Gravida Para Term Preterm AB Living  5 1 0 1 3 1   SAB TAB Ectopic Multiple Live Births  3 0 0 0 1    # Outcome Date GA Lbr Len/2nd Weight Sex Delivery Anes PTL Lv  5 Current           4 SAB 04/21/16 [redacted]w[redacted]d   U         Birth Comments: Needed D&E for missed abortion      Complications: Missed abortion  3 Preterm 04/08/15 [redacted]w[redacted]d 05:42 / 00:15 3 lb 12.7 oz (1.72 kg) F Vag-Spont EPI  LIV     Birth Comments: Dr Ambrose Mantle documented [redacted]w[redacted]d  2 SAB           1 SAB             Social History   Socioeconomic History  . Marital status: Single    Spouse name: Not on file  . Number of children: Not on file  . Years of education: Not on file  . Highest education level: Not on file  Occupational History  . Not on file  Social Needs  . Financial resource strain: Not on file  . Food insecurity:    Worry: Not on file    Inability: Not on file  . Transportation needs:    Medical: Not on file    Non-medical: Not on  file  Tobacco Use  . Smoking status: Never Smoker  . Smokeless tobacco: Never Used  Substance and Sexual Activity  . Alcohol use: Not Currently    Comment: social  . Drug use: Not Currently    Comment: history of marijuana use in past  . Sexual activity: Yes    Birth control/protection: None  Lifestyle  . Physical activity:    Days per week: Not on file    Minutes per session: Not on file  . Stress: Not on file  Relationships  . Social connections:    Talks on phone: Not on file    Gets together: Not on file    Attends religious service: Not on file    Active member of club or organization: Not on file    Attends meetings of clubs or organizations: Not on file    Relationship status: Not on file  Other Topics Concern  . Not on file  Social History Narrative  . Not on file   Family History  Problem Relation Age of Onset  .  Depression Mother     Current Outpatient Medications:  .  ferrous sulfate 325 (65 FE) MG tablet, Take 325 mg by mouth daily with breakfast., Disp: , Rfl:  .  Prenatal Multivit-Min-Fe-FA (PRENATAL VITAMINS) 0.8 MG tablet, Take 1 tablet by mouth daily., Disp: 90 tablet, Rfl: 12 .  ondansetron (ZOFRAN ODT) 8 MG disintegrating tablet, Take 1 tablet (8 mg total) by mouth every 8 (eight) hours as needed for nausea or vomiting. (Patient not taking: Reported on 08/16/2018), Disp: 20 tablet, Rfl: 0 .  valACYclovir (VALTREX) 500 MG tablet, Take 1 tablet (500 mg total) by mouth 2 (two) times daily., Disp: 60 tablet, Rfl: 6  No Known Allergies  Review of Systems: Negative except for what is mentioned in HPI.  Objective:   Vitals:   08/16/18 1500  BP: 118/79  Pulse: 86  Weight: 121 lb 6.4 oz (55.1 kg)   Fetal Status: Fetal Heart Rate (bpm): 140 Fundal Height: 30 cm Movement: Present     Physical Exam: BP 118/79   Pulse 86   Wt 121 lb 6.4 oz (55.1 kg)   LMP 12/19/2017 (Exact Date)   BMI 23.71 kg/m  CONSTITUTIONAL: Well-developed, well-nourished female in  no acute distress.  NEUROLOGIC: Alert and oriented to person, place, and time. Normal reflexes, muscle tone coordination. No cranial nerve deficit noted. PSYCHIATRIC: Normal mood and affect. Normal behavior. Normal judgment and thought content. SKIN: Skin is warm and dry. No rash noted. Not diaphoretic. No erythema. No pallor. HENT:  Normocephalic, atraumatic, External right and left ear normal. Oropharynx is clear and moist EYES: Conjunctivae and EOM are normal. Pupils are equal, round, and reactive to light. No scleral icterus.  NECK: Normal range of motion, supple, no masses CARDIOVASCULAR: Normal heart rate noted, regular rhythm RESPIRATORY: Effort and breath sounds normal, no problems with respiration noted BREASTS: deferred ABDOMEN: Soft, nontender, nondistended, gravid. GU: deferred MUSCULOSKELETAL: Normal range of motion. EXT:  No edema and no tenderness. 2+ distal pulses.   Assessment and Plan:  Pregnancy: G5P0131 at 225w2d by LMP cw 1st trim US  1. Supervision of high risk pregnancy, antepartum - patient up to date with all prenatal labwork - HYDROXYprogesterone Caproate SOAJ 275 mg  2. History of preterm delivery - On 17 P, missed last weeks dose, per records, last dose was 4/20 - will give dose from office stock today  3. History of chlamydia Neg test in pregnancy x2  4. Size of fetus inconsistent with dates, antepartum - Measuring 30 cm - US MFM OB COMP + 14 WK; Future  5. 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   Preterm labor symptoms and general obstetric precautions including but not limited to vaginal bleeding, contractions,  leaking of fluid and fetal movement were reviewed in detail with the patient.  Please refer to After Visit Summary for other counseling recommendations.   Return in about 1 week (around 08/23/2018) for 17P.  Conan BowensKelly M  08/16/2018 3:40 PM

## 2018-08-21 ENCOUNTER — Inpatient Hospital Stay (HOSPITAL_COMMUNITY)
Admission: AD | Admit: 2018-08-21 | Discharge: 2018-08-22 | Disposition: A | Discharge status: Home / Self Care | Payer: Medicaid Other | Attending: Obstetrics and Gynecology | Admitting: Obstetrics and Gynecology

## 2018-08-21 ENCOUNTER — Encounter (HOSPITAL_COMMUNITY): Payer: Self-pay

## 2018-08-21 ENCOUNTER — Other Ambulatory Visit: Payer: Self-pay

## 2018-08-21 DIAGNOSIS — O26893 Other specified pregnancy related conditions, third trimester: Secondary | ICD-10-CM | POA: Insufficient documentation

## 2018-08-21 DIAGNOSIS — B9689 Other specified bacterial agents as the cause of diseases classified elsewhere: Secondary | ICD-10-CM

## 2018-08-21 DIAGNOSIS — Z3A35 35 weeks gestation of pregnancy: Secondary | ICD-10-CM | POA: Insufficient documentation

## 2018-08-21 DIAGNOSIS — M549 Dorsalgia, unspecified: Secondary | ICD-10-CM | POA: Insufficient documentation

## 2018-08-21 DIAGNOSIS — R109 Unspecified abdominal pain: Secondary | ICD-10-CM | POA: Insufficient documentation

## 2018-08-21 DIAGNOSIS — O4703 False labor before 37 completed weeks of gestation, third trimester: Secondary | ICD-10-CM

## 2018-08-21 LAB — URINALYSIS, ROUTINE W REFLEX MICROSCOPIC
Bilirubin Urine: NEGATIVE
Glucose, UA: NEGATIVE mg/dL
Hgb urine dipstick: NEGATIVE
Ketones, ur: NEGATIVE mg/dL
Leukocytes,Ua: NEGATIVE
Nitrite: NEGATIVE
Protein, ur: NEGATIVE mg/dL
Specific Gravity, Urine: 1.003 — ABNORMAL LOW (ref 1.005–1.030)
pH: 7 (ref 5.0–8.0)

## 2018-08-21 NOTE — MAU Note (Signed)
Pt states that she has been having ctx's since 2100.   Pt reports they are 5-7 minutes apart.   Denies vaginal bleeding, reports white creamy discharge with a mix of clear.   Reports +FM

## 2018-08-22 DIAGNOSIS — B9689 Other specified bacterial agents as the cause of diseases classified elsewhere: Secondary | ICD-10-CM | POA: Diagnosis not present

## 2018-08-22 DIAGNOSIS — O26893 Other specified pregnancy related conditions, third trimester: Secondary | ICD-10-CM | POA: Diagnosis not present

## 2018-08-22 DIAGNOSIS — O4703 False labor before 37 completed weeks of gestation, third trimester: Secondary | ICD-10-CM | POA: Diagnosis present

## 2018-08-22 DIAGNOSIS — O23593 Infection of other part of genital tract in pregnancy, third trimester: Secondary | ICD-10-CM | POA: Diagnosis not present

## 2018-08-22 DIAGNOSIS — Z3A35 35 weeks gestation of pregnancy: Secondary | ICD-10-CM | POA: Diagnosis not present

## 2018-08-22 DIAGNOSIS — M549 Dorsalgia, unspecified: Secondary | ICD-10-CM | POA: Diagnosis not present

## 2018-08-22 DIAGNOSIS — R109 Unspecified abdominal pain: Secondary | ICD-10-CM | POA: Diagnosis not present

## 2018-08-22 LAB — WET PREP, GENITAL
Sperm: NONE SEEN
Trich, Wet Prep: NONE SEEN
Yeast Wet Prep HPF POC: NONE SEEN

## 2018-08-22 MED ORDER — NIFEDIPINE 10 MG PO CAPS
10.0000 mg | ORAL_CAPSULE | ORAL | Status: DC | PRN
  Filled 2018-08-22: qty 1

## 2018-08-22 MED ORDER — OXYCODONE-ACETAMINOPHEN 5-325 MG PO TABS
2.0000 | ORAL_TABLET | Freq: Once | ORAL | Status: AC
  Administered 2018-08-22: 02:00:00 2 via ORAL
  Filled 2018-08-22: qty 2

## 2018-08-22 MED ORDER — METRONIDAZOLE 0.75 % VA GEL: 1.0000 | Freq: Every day | VAGINAL | 0 refills | Status: DC

## 2018-08-22 NOTE — MAU Provider Note (Signed)
History     CSN: [id number]  Arrival date and time: 08/21/18 2314   First Provider Initiated Contact with Patient 08/22/18 0042      Chief Complaint  Patient presents with  . Contractions   Keene BreathKaiya Leever is a 23 y.o. [id number] at 5718w1d who presents for Contractions.  She reports she woke up at 9pm with contractions every 5-7 minutes and good fetal movement.  She states she started having discharge this morning that is white, creamy, and non-odorous.  She denies vaginal bleeding and endorses continued fetal movement.  She does endorses sexual activity in the last 72 hours.       OB History    Gravida  5   Para  1   Term  0   Preterm  1   AB  3   Living  1     SAB  3   TAB  0   Ectopic  0   Multiple  0   Live Births  1           Past Medical History:  Diagnosis Date  . Anemia   . Anxiety   . Constipation   . Depression   . Vision abnormalities     Past Surgical History:  Procedure Laterality Date  . DILATION AND EVACUATION N/A 04/21/2016   Procedure: DILATATION AND EVACUATION;  Surgeon: Lavina Hammanodd Meisinger, MD;  Location: [facility];  Service: Gynecology;  Laterality: N/A;  . WISDOM TOOTH EXTRACTION      Family History  Problem Relation Age of Onset  . Depression Mother     Social History   Tobacco Use  . Smoking status: Never Smoker  . Smokeless tobacco: Never Used  Substance Use Topics  . Alcohol use: Not Currently    Comment: social  . Drug use: Not Currently    Comment: history of marijuana use in past    Allergies: No Known Allergies  Medications Prior to Admission  Medication Sig Dispense Refill Last Dose  . ferrous sulfate 325 (65 FE) MG tablet Take 325 mg by mouth daily with breakfast.   Past Week at Unknown time  . Prenatal Multivit-Min-Fe-FA (PRENATAL VITAMINS) 0.8 MG tablet Take 1 tablet by mouth daily. 90 tablet 12 08/21/2018 at Unknown time  . valACYclovir (VALTREX) 500 MG tablet Take 1 tablet (500 mg total) by mouth 2 (two) times  daily. 60 tablet 6 08/21/2018 at Unknown time  . ondansetron (ZOFRAN ODT) 8 MG disintegrating tablet Take 1 tablet (8 mg total) by mouth every 8 (eight) hours as needed for nausea or vomiting. (Patient not taking: Reported on 08/16/2018) 20 tablet 0 Not Taking    Review of Systems  Constitutional: Negative for chills and fever.  Respiratory: Negative for cough and shortness of breath.   Gastrointestinal: Positive for abdominal pain (Contractions 7/10). Negative for constipation, diarrhea, nausea and vomiting.  Musculoskeletal: Positive for back pain.   Physical Exam   Blood pressure 106/70, pulse 84, temperature 98.8 F (37.1 C), resp. rate 16, last menstrual period 12/19/2017, SpO2 100 %.  Physical Exam  Constitutional: She is oriented to person, place, and time. She appears well-developed and well-nourished.  HENT:  Head: Normocephalic and atraumatic.  Eyes: Conjunctivae are normal.  Neck: Normal range of motion.  Cardiovascular: Normal rate, regular rhythm and normal heart sounds.  Respiratory: Effort normal and breath sounds normal.  GI: Soft.  Genitourinary: Cervix exhibits no motion tenderness and no friability.    Vaginal discharge present.  No vaginal bleeding.  No bleeding in the vagina.    Genitourinary Comments: Sterile Speculum Exam: -Vaginal Vault: Pink mucosa with white plaques noted throughout vault.  Copious amt thin white curdy discharge -wet prep collected -Cervix:Pink, no lesions, cysts, or polyps.  Appears closed. No active bleeding from os -Bimanual Exam: Dilation: 1 Effacement (%): 50 Station: Ballotable Presentation: Vertex Exam by:: Gerrit Heck, CNM     Musculoskeletal: Normal range of motion.  Neurological: She is alert and oriented to person, place, and time.  Skin: Skin is warm and dry.  Psychiatric: She has a normal mood and affect. Her behavior is normal.    Fetal Assessment 135 bpm, Mod Var, -Decels, +Accels Toco: Q4-17min, palpates  moderate  MAU Course   Results for orders placed or performed during the hospital encounter of 08/21/18 (from the past 24 hour(s))  Urinalysis, Routine w reflex microscopic     Status: Abnormal   Collection Time: 08/21/18 11:40 PM  Result Value Ref Range   Color, Urine COLORLESS (A) YELLOW   APPearance CLEAR CLEAR   Specific Gravity, Urine 1.003 (L) 1.005 - 1.030   pH 7.0 5.0 - 8.0   Glucose, UA NEGATIVE NEGATIVE mg/dL   Hgb urine dipstick NEGATIVE NEGATIVE   Bilirubin Urine NEGATIVE NEGATIVE   Ketones, ur NEGATIVE NEGATIVE mg/dL   Protein, ur NEGATIVE NEGATIVE mg/dL   Nitrite NEGATIVE NEGATIVE   Leukocytes,Ua NEGATIVE NEGATIVE  Wet prep, genital     Status: Abnormal   Collection Time: 08/22/18  1:16 AM  Result Value Ref Range   Yeast Wet Prep HPF POC NONE SEEN NONE SEEN   Trich, Wet Prep NONE SEEN NONE SEEN   Clue Cells Wet Prep HPF POC PRESENT (A) NONE SEEN   WBC, Wet Prep HPF POC MODERATE (A) NONE SEEN   Sperm NONE SEEN    No results found.  MDM PE Labs:UA, WP EFM  Assessment and Plan  23 year old G5P0131 at 35.1 weeks Cat I FT Contractions  -Exam findings discussed -Offered and declines pain medication. -Discussed initiation of procardia protocol for contractions in setting of unchanged cervix. -Patient agreeable and without questions. -WP sent -Will await results and reassess as appropriate.  Follow Up (0150) -Nurse reports blood pressure low. -Instructed to hold procardia and ask patient if she would instead like pain medication. -Patient agreeable to pain medication if orally. -Percocet 2 tablets ordered   Follow Up (2:58 AM) Bacterial Vaginosis  -Wet prep returns significant for clue cells. -Results discussed with patient. -Patient reports she took oral medication last month. -Discussed initiation of vaginal gel and patient agreeable. -Rx for Metrogel 0.75% PV QHS x 5days sent to pharmacy on file.  -Patient reports pain has resolved completely  and she no longer perceives contractions.  -Patient reports she has an appt on Thursday for 17p injection. -No questions or concerns.  -Encouraged to call or return to MAU if symptoms worsen or with the onset of new symptoms. -Discharged to home in improved condition.  Cherre Robins MSN, CNM 08/22/2018, 12:42 AM

## 2018-08-22 NOTE — Discharge Instructions (Signed)
Bacterial Vaginosis    Bacterial vaginosis is a vaginal infection that occurs when the normal balance of bacteria in the vagina is disrupted. It results from an overgrowth of certain bacteria. This is the most common vaginal infection among women ages 15-44.  Because bacterial vaginosis increases your risk for STIs (sexually transmitted infections), getting treated can help reduce your risk for chlamydia, gonorrhea, herpes, and HIV (human immunodeficiency virus). Treatment is also important for preventing complications in pregnant women, because this condition can cause an early (premature) delivery.  What are the causes?  This condition is caused by an increase in harmful bacteria that are normally present in small amounts in the vagina. However, the reason that the condition develops is not fully understood.  What increases the risk?  The following factors may make you more likely to develop this condition:  · Having a new sexual partner or multiple sexual partners.  · Having unprotected sex.  · Douching.  · Having an intrauterine device (IUD).  · Smoking.  · Drug and alcohol abuse.  · Taking certain antibiotic medicines.  · Being pregnant.  You cannot get bacterial vaginosis from toilet seats, bedding, swimming pools, or contact with objects around you.  What are the signs or symptoms?  Symptoms of this condition include:  · Grey or white vaginal discharge. The discharge can also be watery or foamy.  · A fish-like odor with discharge, especially after sexual intercourse or during menstruation.  · Itching in and around the vagina.  · Burning or pain with urination.  Some women with bacterial vaginosis have no signs or symptoms.  How is this diagnosed?  This condition is diagnosed based on:  · Your medical history.  · A physical exam of the vagina.  · Testing a sample of vaginal fluid under a microscope to look for a large amount of bad bacteria or abnormal cells. Your health care provider may use a cotton swab or  a small wooden spatula to collect the sample.  How is this treated?  This condition is treated with antibiotics. These may be given as a pill, a vaginal cream, or a medicine that is put into the vagina (suppository). If the condition comes back after treatment, a second round of antibiotics may be needed.  Follow these instructions at home:  Medicines  · Take over-the-counter and prescription medicines only as told by your health care provider.  · Take or use your antibiotic as told by your health care provider. Do not stop taking or using the antibiotic even if you start to feel better.  General instructions  · If you have a female sexual partner, tell her that you have a vaginal infection. She should see her health care provider and be treated if she has symptoms. If you have a female sexual partner, he does not need treatment.  · During treatment:  ? Avoid sexual activity until you finish treatment.  ? Do not douche.  ? Avoid alcohol as directed by your health care provider.  ? Avoid breastfeeding as directed by your health care provider.  · Drink enough water and fluids to keep your urine clear or pale yellow.  · Keep the area around your vagina and rectum clean.  ? Wash the area daily with warm water.  ? Wipe yourself from front to back after using the toilet.  · Keep all follow-up visits as told by your health care provider. This is important.  How is this prevented?  · Do not   douche.  · Wash the outside of your vagina with warm water only.  · Use protection when having sex. This includes latex condoms and dental dams.  · Limit how many sexual partners you have. To help prevent bacterial vaginosis, it is best to have sex with just one partner (monogamous).  · Make sure you and your sexual partner are tested for STIs.  · Wear cotton or cotton-lined underwear.  · Avoid wearing tight pants and pantyhose, especially during summer.  · Limit the amount of alcohol that you drink.  · Do not use any products that contain  nicotine or tobacco, such as cigarettes and e-cigarettes. If you need help quitting, ask your health care provider.  · Do not use illegal drugs.  Where to find more information  · Centers for Disease Control and Prevention: www.cdc.gov/std  · American Sexual Health Association (ASHA): www.ashastd.org  · U.S. Department of Health and Human Services, Office on Women's Health: www.womenshealth.gov/ or https://www.womenshealth.gov/a-z-topics/bacterial-vaginosis  Contact a health care provider if:  · Your symptoms do not improve, even after treatment.  · You have more discharge or pain when urinating.  · You have a fever.  · You have pain in your abdomen.  · You have pain during sex.  · You have vaginal bleeding between periods.  Summary  · Bacterial vaginosis is a vaginal infection that occurs when the normal balance of bacteria in the vagina is disrupted.  · Because bacterial vaginosis increases your risk for STIs (sexually transmitted infections), getting treated can help reduce your risk for chlamydia, gonorrhea, herpes, and HIV (human immunodeficiency virus). Treatment is also important for preventing complications in pregnant women, because the condition can cause an early (premature) delivery.  · This condition is treated with antibiotic medicines. These may be given as a pill, a vaginal cream, or a medicine that is put into the vagina (suppository).  This information is not intended to replace advice given to you by your health care provider. Make sure you discuss any questions you have with your health care provider.  Document Released: 03/28/2005 Document Revised: 08/01/2016 Document Reviewed: 12/12/2015  Elsevier Interactive Patient Education © 2019 Elsevier Inc.

## 2018-08-23 ENCOUNTER — Ambulatory Visit (INDEPENDENT_AMBULATORY_CARE_PROVIDER_SITE_OTHER): Payer: Medicaid Other | Admitting: Obstetrics and Gynecology

## 2018-08-23 ENCOUNTER — Encounter: Payer: Self-pay | Admitting: Obstetrics and Gynecology

## 2018-08-23 ENCOUNTER — Other Ambulatory Visit: Payer: Self-pay

## 2018-08-23 VITALS — BP 102/68 | HR 59 | Wt 121.3 lb

## 2018-08-23 DIAGNOSIS — Z3A35 35 weeks gestation of pregnancy: Secondary | ICD-10-CM

## 2018-08-23 DIAGNOSIS — O0993 Supervision of high risk pregnancy, unspecified, third trimester: Secondary | ICD-10-CM | POA: Diagnosis not present

## 2018-08-23 DIAGNOSIS — Z8751 Personal history of pre-term labor: Secondary | ICD-10-CM

## 2018-08-23 DIAGNOSIS — O0990 Supervision of high risk pregnancy, unspecified, unspecified trimester: Secondary | ICD-10-CM

## 2018-08-23 MED ORDER — HYDROXYPROGESTERONE CAPROATE 275 MG/1.1ML ~~LOC~~ SOAJ
275.0000 mg | Freq: Once | SUBCUTANEOUS | Status: AC
  Administered 2018-08-23: 275 mg via SUBCUTANEOUS

## 2018-08-23 NOTE — Patient Instructions (Signed)
Third Trimester of Pregnancy The third trimester is from week 28 through week 40 (months 7 through 9). The third trimester is a time when the unborn baby (fetus) is growing rapidly. At the end of the ninth month, the fetus is about 20 inches in length and weighs 6-10 pounds. Body changes during your third trimester Your body will continue to go through many changes during pregnancy. The changes vary from woman to woman. During the third trimester:  Your weight will continue to increase. You can expect to gain 25-35 pounds (11-16 kg) by the end of the pregnancy.  You may begin to get stretch marks on your hips, abdomen, and breasts.  You may urinate more often because the fetus is moving lower into your pelvis and pressing on your bladder.  You may develop or continue to have heartburn. This is caused by increased hormones that slow down muscles in the digestive tract.  You may develop or continue to have constipation because increased hormones slow digestion and cause the muscles that push waste through your intestines to relax.  You may develop hemorrhoids. These are swollen veins (varicose veins) in the rectum that can itch or be painful.  You may develop swollen, bulging veins (varicose veins) in your legs.  You may have increased body aches in the pelvis, back, or thighs. This is due to weight gain and increased hormones that are relaxing your joints.  You may have changes in your hair. These can include thickening of your hair, rapid growth, and changes in texture. Some women also have hair loss during or after pregnancy, or hair that feels dry or thin. Your hair will most likely return to normal after your baby is born.  Your breasts will continue to grow and they will continue to become tender. A yellow fluid (colostrum) may leak from your breasts. This is the first milk you are producing for your baby.  Your belly button may stick out.  You may notice more swelling in your hands,  face, or ankles.  You may have increased tingling or numbness in your hands, arms, and legs. The skin on your belly may also feel numb.  You may feel short of breath because of your expanding uterus.  You may have more problems sleeping. This can be caused by the size of your belly, increased need to urinate, and an increase in your body's metabolism.  You may notice the fetus "dropping," or moving lower in your abdomen (lightening).  You may have increased vaginal discharge.  You may notice your joints feel loose and you may have pain around your pelvic bone. What to expect at prenatal visits You will have prenatal exams every 2 weeks until week 36. Then you will have weekly prenatal exams. During a routine prenatal visit:  You will be weighed to make sure you and the baby are growing normally.  Your blood pressure will be taken.  Your abdomen will be measured to track your baby's growth.  The fetal heartbeat will be listened to.  Any test results from the previous visit will be discussed.  You may have a cervical check near your due date to see if your cervix has softened or thinned (effaced).  You will be tested for Group B streptococcus. This happens between 35 and 37 weeks. Your health care provider may ask you:  What your birth plan is.  How you are feeling.  If you are feeling the baby move.  If you have had any abnormal   symptoms, such as leaking fluid, bleeding, severe headaches, or abdominal cramping.  If you are using any tobacco products, including cigarettes, chewing tobacco, and electronic cigarettes.  If you have any questions. Other tests or screenings that may be performed during your third trimester include:  Blood tests that check for low iron levels (anemia).  Fetal testing to check the health, activity level, and growth of the fetus. Testing is done if you have certain medical conditions or if there are problems during the pregnancy.  Nonstress test  (NST). This test checks the health of your baby to make sure there are no signs of problems, such as the baby not getting enough oxygen. During this test, a belt is placed around your belly. The baby is made to move, and its heart rate is monitored during movement. What is false labor? False labor is a condition in which you feel small, irregular tightenings of the muscles in the womb (contractions) that usually go away with rest, changing position, or drinking water. These are called Braxton Hicks contractions. Contractions may last for hours, days, or even weeks before true labor sets in. If contractions come at regular intervals, become more frequent, increase in intensity, or become painful, you should see your health care provider. What are the signs of labor?  Abdominal cramps.  Regular contractions that start at 10 minutes apart and become stronger and more frequent with time.  Contractions that start on the top of the uterus and spread down to the lower abdomen and back.  Increased pelvic pressure and dull back pain.  A watery or bloody mucus discharge that comes from the vagina.  Leaking of amniotic fluid. This is also known as your "water breaking." It could be a slow trickle or a gush. Let your health care provider know if it has a color or strange odor. If you have any of these signs, call your health care provider right away, even if it is before your due date. Follow these instructions at home: Medicines  Follow your health care provider's instructions regarding medicine use. Specific medicines may be either safe or unsafe to take during pregnancy.  Take a prenatal vitamin that contains at least 600 micrograms (mcg) of folic acid.  If you develop constipation, try taking a stool softener if your health care provider approves. Eating and drinking   Eat a balanced diet that includes fresh fruits and vegetables, whole grains, good sources of protein such as meat, eggs, or tofu,  and low-fat dairy. Your health care provider will help you determine the amount of weight gain that is right for you.  Avoid raw meat and uncooked cheese. These carry germs that can cause birth defects in the baby.  If you have low calcium intake from food, talk to your health care provider about whether you should take a daily calcium supplement.  Eat four or five small meals rather than three large meals a day.  Limit foods that are high in fat and processed sugars, such as fried and sweet foods.  To prevent constipation: ? Drink enough fluid to keep your urine clear or pale yellow. ? Eat foods that are high in fiber, such as fresh fruits and vegetables, whole grains, and beans. Activity  Exercise only as directed by your health care provider. Most women can continue their usual exercise routine during pregnancy. Try to exercise for 30 minutes at least 5 days a week. Stop exercising if you experience uterine contractions.  Avoid heavy lifting.  Do   not exercise in extreme heat or humidity, or at high altitudes.  Wear low-heel, comfortable shoes.  Practice good posture.  You may continue to have sex unless your health care provider tells you otherwise. Relieving pain and discomfort  Take frequent breaks and rest with your legs elevated if you have leg cramps or low back pain.  Take warm sitz baths to soothe any pain or discomfort caused by hemorrhoids. Use hemorrhoid cream if your health care provider approves.  Wear a good support bra to prevent discomfort from breast tenderness.  If you develop varicose veins: ? Wear support pantyhose or compression stockings as told by your healthcare provider. ? Elevate your feet for 15 minutes, 3-4 times a day. Prenatal care  Write down your questions. Take them to your prenatal visits.  Keep all your prenatal visits as told by your health care provider. This is important. Safety  Wear your seat belt at all times when driving.  Make  a list of emergency phone numbers, including numbers for family, friends, the hospital, and police and fire departments. General instructions  Avoid cat litter boxes and soil used by cats. These carry germs that can cause birth defects in the baby. If you have a cat, ask someone to clean the litter box for you.  Do not travel far distances unless it is absolutely necessary and only with the approval of your health care provider.  Do not use hot tubs, steam rooms, or saunas.  Do not drink alcohol.  Do not use any products that contain nicotine or tobacco, such as cigarettes and e-cigarettes. If you need help quitting, ask your health care provider.  Do not use any medicinal herbs or unprescribed drugs. These chemicals affect the formation and growth of the baby.  Do not douche or use tampons or scented sanitary pads.  Do not cross your legs for long periods of time.  To prepare for the arrival of your baby: ? Take prenatal classes to understand, practice, and ask questions about labor and delivery. ? Make a trial run to the hospital. ? Visit the hospital and tour the maternity area. ? Arrange for maternity or paternity leave through employers. ? Arrange for family and friends to take care of pets while you are in the hospital. ? Purchase a rear-facing car seat and make sure you know how to install it in your car. ? Pack your hospital bag. ? Prepare the baby's nursery. Make sure to remove all pillows and stuffed animals from the baby's crib to prevent suffocation.  Visit your dentist if you have not gone during your pregnancy. Use a soft toothbrush to brush your teeth and be gentle when you floss. Contact a health care provider if:  You are unsure if you are in labor or if your water has broken.  You become dizzy.  You have mild pelvic cramps, pelvic pressure, or nagging pain in your abdominal area.  You have lower back pain.  You have persistent nausea, vomiting, or  diarrhea.  You have an unusual or bad smelling vaginal discharge.  You have pain when you urinate. Get help right away if:  Your water breaks before 37 weeks.  You have regular contractions less than 5 minutes apart before 37 weeks.  You have a fever.  You are leaking fluid from your vagina.  You have spotting or bleeding from your vagina.  You have severe abdominal pain or cramping.  You have rapid weight loss or weight gain.  You have   shortness of breath with chest pain.  You notice sudden or extreme swelling of your face, hands, ankles, feet, or legs.  Your baby makes fewer than 10 movements in 2 hours.  You have severe headaches that do not go away when you take medicine.  You have vision changes. Summary  The third trimester is from week 28 through week 40, months 7 through 9. The third trimester is a time when the unborn baby (fetus) is growing rapidly.  During the third trimester, your discomfort may increase as you and your baby continue to gain weight. You may have abdominal, leg, and back pain, sleeping problems, and an increased need to urinate.  During the third trimester your breasts will keep growing and they will continue to become tender. A yellow fluid (colostrum) may leak from your breasts. This is the first milk you are producing for your baby.  False labor is a condition in which you feel small, irregular tightenings of the muscles in the womb (contractions) that eventually go away. These are called Braxton Hicks contractions. Contractions may last for hours, days, or even weeks before true labor sets in.  Signs of labor can include: abdominal cramps; regular contractions that start at 10 minutes apart and become stronger and more frequent with time; watery or bloody mucus discharge that comes from the vagina; increased pelvic pressure and dull back pain; and leaking of amniotic fluid. This information is not intended to replace advice given to you by your  health care provider. Make sure you discuss any questions you have with your health care provider. Document Released: 03/22/2001 Document Revised: 05/03/2016 Document Reviewed: 05/03/2016 Elsevier Interactive Patient Education  2019 [employer]  
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
## 2018-08-23 NOTE — Progress Notes (Signed)
17P given in RA, tolerated well.  Administrations This Visit    HYDROXYprogesterone Caproate SOAJ 275 mg    Admin Date 08/23/2018 Action Given Dose 275 mg Route Subcutaneous Administered By Maretta Bees, RMA

## 2018-08-24 ENCOUNTER — Ambulatory Visit (HOSPITAL_COMMUNITY)
Admission: RE | Admit: 2018-08-24 | Discharge: 2018-08-24 | Disposition: A | Discharge status: Home / Self Care | Payer: Medicaid Other | Source: Ambulatory Visit | Attending: Obstetrics and Gynecology | Admitting: Obstetrics and Gynecology

## 2018-08-24 DIAGNOSIS — O26843 Uterine size-date discrepancy, third trimester: Secondary | ICD-10-CM

## 2018-08-24 DIAGNOSIS — O26849 Uterine size-date discrepancy, unspecified trimester: Secondary | ICD-10-CM | POA: Insufficient documentation

## 2018-08-24 DIAGNOSIS — Z3A35 35 weeks gestation of pregnancy: Secondary | ICD-10-CM

## 2018-08-24 DIAGNOSIS — O365931 Maternal care for other known or suspected poor fetal growth, third trimester, fetus 1: Secondary | ICD-10-CM | POA: Diagnosis not present

## 2018-08-24 DIAGNOSIS — Z363 Encounter for antenatal screening for malformations: Secondary | ICD-10-CM | POA: Diagnosis not present

## 2018-08-31 ENCOUNTER — Ambulatory Visit (INDEPENDENT_AMBULATORY_CARE_PROVIDER_SITE_OTHER): Payer: Medicaid Other | Admitting: Obstetrics & Gynecology

## 2018-08-31 ENCOUNTER — Other Ambulatory Visit: Payer: Self-pay

## 2018-08-31 ENCOUNTER — Other Ambulatory Visit: Payer: Self-pay | Admitting: Obstetrics and Gynecology

## 2018-08-31 ENCOUNTER — Other Ambulatory Visit (HOSPITAL_COMMUNITY): Payer: Self-pay | Admitting: *Deleted

## 2018-08-31 ENCOUNTER — Other Ambulatory Visit (HOSPITAL_COMMUNITY)
Admission: RE | Admit: 2018-08-31 | Discharge: 2018-08-31 | Disposition: A | Discharge status: Home / Self Care | Payer: Medicaid Other | Source: Ambulatory Visit | Attending: Obstetrics & Gynecology | Admitting: Obstetrics & Gynecology

## 2018-08-31 VITALS — BP 105/69 | HR 68 | Temp 97.8°F | Wt 124.0 lb

## 2018-08-31 DIAGNOSIS — O09213 Supervision of pregnancy with history of pre-term labor, third trimester: Secondary | ICD-10-CM

## 2018-08-31 DIAGNOSIS — Z3A36 36 weeks gestation of pregnancy: Secondary | ICD-10-CM | POA: Diagnosis not present

## 2018-08-31 DIAGNOSIS — Z363 Encounter for antenatal screening for malformations: Secondary | ICD-10-CM

## 2018-08-31 DIAGNOSIS — O365930 Maternal care for other known or suspected poor fetal growth, third trimester, not applicable or unspecified: Secondary | ICD-10-CM

## 2018-08-31 DIAGNOSIS — O26849 Uterine size-date discrepancy, unspecified trimester: Secondary | ICD-10-CM

## 2018-08-31 DIAGNOSIS — Z3A35 35 weeks gestation of pregnancy: Secondary | ICD-10-CM

## 2018-08-31 DIAGNOSIS — Z8751 Personal history of pre-term labor: Secondary | ICD-10-CM

## 2018-08-31 DIAGNOSIS — O0990 Supervision of high risk pregnancy, unspecified, unspecified trimester: Secondary | ICD-10-CM | POA: Insufficient documentation

## 2018-08-31 DIAGNOSIS — O365931 Maternal care for other known or suspected poor fetal growth, third trimester, fetus 1: Secondary | ICD-10-CM

## 2018-08-31 DIAGNOSIS — O0993 Supervision of high risk pregnancy, unspecified, third trimester: Secondary | ICD-10-CM | POA: Diagnosis not present

## 2018-08-31 MED ORDER — HYDROXYPROGESTERONE CAPROATE 275 MG/1.1ML ~~LOC~~ SOAJ
275.0000 mg | Freq: Once | SUBCUTANEOUS | Status: AC
  Administered 2018-08-31: 11:00:00 275 mg via SUBCUTANEOUS
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
## 2018-08-31 NOTE — Patient Instructions (Signed)

## 2018-09-02 LAB — STREP GP B NAA: Strep Gp B NAA: NEGATIVE

## 2018-09-04 LAB — GC/CHLAMYDIA PROBE AMP (~~LOC~~) NOT AT ARMC
Chlamydia: NEGATIVE
Neisseria Gonorrhea: NEGATIVE

## 2018-09-05 ENCOUNTER — Encounter (HOSPITAL_COMMUNITY): Payer: Self-pay | Admitting: *Deleted

## 2018-09-05 ENCOUNTER — Other Ambulatory Visit: Payer: Self-pay

## 2018-09-05 ENCOUNTER — Inpatient Hospital Stay (HOSPITAL_COMMUNITY)
Admission: AD | Admit: 2018-09-05 | Discharge: 2018-09-05 | Disposition: A | Discharge status: Home / Self Care | Payer: Medicaid Other | Source: Home / Self Care | Attending: Obstetrics and Gynecology | Admitting: Obstetrics and Gynecology

## 2018-09-05 ENCOUNTER — Encounter: Payer: Self-pay | Admitting: Obstetrics and Gynecology

## 2018-09-05 DIAGNOSIS — B009 Herpesviral infection, unspecified: Secondary | ICD-10-CM

## 2018-09-05 DIAGNOSIS — O283 Abnormal ultrasonic finding on antenatal screening of mother: Secondary | ICD-10-CM | POA: Insufficient documentation

## 2018-09-05 DIAGNOSIS — Z3A00 Weeks of gestation of pregnancy not specified: Secondary | ICD-10-CM | POA: Insufficient documentation

## 2018-09-05 DIAGNOSIS — O479 False labor, unspecified: Secondary | ICD-10-CM

## 2018-09-05 DIAGNOSIS — O0990 Supervision of high risk pregnancy, unspecified, unspecified trimester: Secondary | ICD-10-CM

## 2018-09-05 NOTE — Discharge Instructions (Signed)

## 2018-09-05 NOTE — MAU Note (Signed)
I have communicated with Dr. Earlene Plater and reviewed vital signs:  Vitals:   09/05/18 1017 09/05/18 1159  BP: 104/70 107/63  Pulse: 68 (!) 55  Resp: 20 20  Temp: 97.9 F (36.6 C) 97.9 F (36.6 C)  SpO2: 100% 100%    Vaginal exam:  Dilation: 3 Effacement (%): 80 Station: -2 Presentation: Vertex Exam by:: F. Takeia Ciaravino, RNC,   Also reviewed contraction pattern and that non-stress test is reactive.  It has been documented that patient is contracting every 6-7.5 minutes with no cervical change over 1 hour 15 minutes not indicating active labor.  Patient denies any other complaints.  Based on this report provider has given order for discharge.  A discharge order and diagnosis entered by a provider.   Labor discharge instructions reviewed with patient.

## 2018-09-05 NOTE — MAU Note (Signed)
Presents with c/o ctxs that began @ 0700, reports ctxs are 7 minutes apart.  Denies LOF or VB.  Reports +FM.

## 2018-09-06 ENCOUNTER — Inpatient Hospital Stay (HOSPITAL_COMMUNITY): Payer: Medicaid Other | Admitting: Anesthesiology

## 2018-09-06 ENCOUNTER — Encounter (HOSPITAL_COMMUNITY): Payer: Self-pay

## 2018-09-06 ENCOUNTER — Inpatient Hospital Stay (HOSPITAL_COMMUNITY)
Admission: AD | Admit: 2018-09-06 | Discharge: 2018-09-08 | DRG: 806 | Disposition: A | Discharge status: Home / Self Care | Payer: Medicaid Other | Attending: Family Medicine | Admitting: Family Medicine

## 2018-09-06 DIAGNOSIS — Z8751 Personal history of pre-term labor: Secondary | ICD-10-CM

## 2018-09-06 DIAGNOSIS — O26893 Other specified pregnancy related conditions, third trimester: Secondary | ICD-10-CM | POA: Diagnosis present

## 2018-09-06 DIAGNOSIS — O9832 Other infections with a predominantly sexual mode of transmission complicating childbirth: Secondary | ICD-10-CM | POA: Diagnosis present

## 2018-09-06 DIAGNOSIS — Z1159 Encounter for screening for other viral diseases: Secondary | ICD-10-CM | POA: Diagnosis not present

## 2018-09-06 DIAGNOSIS — B009 Herpesviral infection, unspecified: Secondary | ICD-10-CM | POA: Diagnosis present

## 2018-09-06 DIAGNOSIS — D649 Anemia, unspecified: Secondary | ICD-10-CM | POA: Diagnosis present

## 2018-09-06 DIAGNOSIS — Z3A37 37 weeks gestation of pregnancy: Secondary | ICD-10-CM

## 2018-09-06 DIAGNOSIS — A6000 Herpesviral infection of urogenital system, unspecified: Secondary | ICD-10-CM | POA: Diagnosis present

## 2018-09-06 DIAGNOSIS — O9902 Anemia complicating childbirth: Secondary | ICD-10-CM | POA: Diagnosis present

## 2018-09-06 DIAGNOSIS — F321 Major depressive disorder, single episode, moderate: Secondary | ICD-10-CM

## 2018-09-06 DIAGNOSIS — O283 Abnormal ultrasonic finding on antenatal screening of mother: Secondary | ICD-10-CM | POA: Diagnosis present

## 2018-09-06 LAB — SARS CORONAVIRUS 2 BY RT PCR (HOSPITAL ORDER, PERFORMED IN ~~LOC~~ HOSPITAL LAB): SARS Coronavirus 2: NEGATIVE

## 2018-09-06 LAB — CBC
HCT: 32.5 % — ABNORMAL LOW (ref 36.0–46.0)
Hemoglobin: 10.7 g/dL — ABNORMAL LOW (ref 12.0–15.0)
MCH: 28 pg (ref 26.0–34.0)
MCHC: 32.9 g/dL (ref 30.0–36.0)
MCV: 85.1 fL (ref 80.0–100.0)
Platelets: 240 10*3/uL (ref 150–400)
RBC: 3.82 MIL/uL — ABNORMAL LOW (ref 3.87–5.11)
RDW: 13.9 % (ref 11.5–15.5)
WBC: 8.5 10*3/uL (ref 4.0–10.5)
nRBC: 0 % (ref 0.0–0.2)

## 2018-09-06 LAB — ABO/RH: ABO/RH(D): A POS

## 2018-09-06 LAB — TYPE AND SCREEN
ABO/RH(D): A POS
Antibody Screen: NEGATIVE

## 2018-09-06 LAB — RPR: RPR Ser Ql: NONREACTIVE

## 2018-09-06 MED ORDER — ONDANSETRON HCL 4 MG PO TABS: 4.0000 mg | ORAL_TABLET | ORAL | Status: DC | PRN

## 2018-09-06 MED ORDER — ACETAMINOPHEN 325 MG PO TABS
650.0000 mg | ORAL_TABLET | ORAL | Status: DC | PRN
  Administered 2018-09-06 (×2): 650 mg via ORAL
  Filled 2018-09-06 (×2): qty 2

## 2018-09-06 MED ORDER — SIMETHICONE 80 MG PO CHEW: 80.0000 mg | CHEWABLE_TABLET | ORAL | Status: DC | PRN

## 2018-09-06 MED ORDER — IBUPROFEN 600 MG PO TABS
600.0000 mg | ORAL_TABLET | Freq: Four times a day (QID) | ORAL | Status: DC
  Administered 2018-09-06 – 2018-09-08 (×8): 600 mg via ORAL
  Filled 2018-09-06 (×9): qty 1

## 2018-09-06 MED ORDER — LIDOCAINE HCL (PF) 1 % IJ SOLN: 30.0000 mL | INTRAMUSCULAR | Status: DC | PRN

## 2018-09-06 MED ORDER — SENNOSIDES-DOCUSATE SODIUM 8.6-50 MG PO TABS
2.0000 | ORAL_TABLET | ORAL | Status: DC
  Administered 2018-09-07: 2 via ORAL
  Filled 2018-09-06 (×2): qty 2

## 2018-09-06 MED ORDER — TETANUS-DIPHTH-ACELL PERTUSSIS 5-2.5-18.5 LF-MCG/0.5 IM SUSP: 0.5000 mL | Freq: Once | INTRAMUSCULAR | Status: DC

## 2018-09-06 MED ORDER — DIPHENHYDRAMINE HCL 25 MG PO CAPS: 25.0000 mg | ORAL_CAPSULE | Freq: Four times a day (QID) | ORAL | Status: DC | PRN

## 2018-09-06 MED ORDER — LIDOCAINE HCL (PF) 1 % IJ SOLN
INTRAMUSCULAR | Status: DC | PRN
  Administered 2018-09-06: 5 mL via EPIDURAL

## 2018-09-06 MED ORDER — COCONUT OIL OIL: 1.0000 "application " | TOPICAL_OIL | Status: DC | PRN

## 2018-09-06 MED ORDER — OXYCODONE HCL 5 MG PO TABS
5.0000 mg | ORAL_TABLET | ORAL | Status: DC | PRN
  Administered 2018-09-06 – 2018-09-07 (×3): 5 mg via ORAL
  Filled 2018-09-06 (×3): qty 1

## 2018-09-06 MED ORDER — SOD CITRATE-CITRIC ACID 500-334 MG/5ML PO SOLN: 30.0000 mL | ORAL | Status: DC | PRN

## 2018-09-06 MED ORDER — WITCH HAZEL-GLYCERIN EX PADS: 1.0000 "application " | MEDICATED_PAD | CUTANEOUS | Status: DC | PRN

## 2018-09-06 MED ORDER — DIBUCAINE (PERIANAL) 1 % EX OINT: 1.0000 "application " | TOPICAL_OINTMENT | CUTANEOUS | Status: DC | PRN

## 2018-09-06 MED ORDER — OXYCODONE-ACETAMINOPHEN 5-325 MG PO TABS: 1.0000 | ORAL_TABLET | ORAL | Status: DC | PRN

## 2018-09-06 MED ORDER — OXYTOCIN 40 UNITS IN NORMAL SALINE INFUSION - SIMPLE MED: 2.5000 [IU]/h | INTRAVENOUS | Status: DC

## 2018-09-06 MED ORDER — LACTATED RINGERS IV SOLN
INTRAVENOUS | Status: DC
  Administered 2018-09-06: 125 mL via INTRAVENOUS

## 2018-09-06 MED ORDER — ONDANSETRON HCL 4 MG/2ML IJ SOLN: 4.0000 mg | Freq: Four times a day (QID) | INTRAMUSCULAR | Status: DC | PRN

## 2018-09-06 MED ORDER — PRENATAL MULTIVITAMIN CH
1.0000 | ORAL_TABLET | Freq: Every day | ORAL | Status: DC
  Administered 2018-09-06 – 2018-09-08 (×3): 1 via ORAL
  Filled 2018-09-06 (×3): qty 1

## 2018-09-06 MED ORDER — ZOLPIDEM TARTRATE 5 MG PO TABS: 5.0000 mg | ORAL_TABLET | Freq: Every evening | ORAL | Status: DC | PRN

## 2018-09-06 MED ORDER — OXYTOCIN BOLUS FROM INFUSION
500.0000 mL | Freq: Once | INTRAVENOUS | Status: AC
  Administered 2018-09-06: 500 mL via INTRAVENOUS

## 2018-09-06 MED ORDER — LACTATED RINGERS IV SOLN: 500.0000 mL | INTRAVENOUS | Status: DC | PRN

## 2018-09-06 MED ORDER — OXYCODONE-ACETAMINOPHEN 5-325 MG PO TABS: 2.0000 | ORAL_TABLET | ORAL | Status: DC | PRN

## 2018-09-06 MED ORDER — OXYTOCIN 40 UNITS IN NORMAL SALINE INFUSION - SIMPLE MED
INTRAVENOUS | Status: AC
  Filled 2018-09-06: qty 1000

## 2018-09-06 MED ORDER — BENZOCAINE-MENTHOL 20-0.5 % EX AERO: 1.0000 "application " | INHALATION_SPRAY | CUTANEOUS | Status: DC | PRN

## 2018-09-06 MED ORDER — ACETAMINOPHEN 325 MG PO TABS: 650.0000 mg | ORAL_TABLET | ORAL | Status: DC | PRN

## 2018-09-06 MED ORDER — FENTANYL-BUPIVACAINE-NACL 0.5-0.125-0.9 MG/250ML-% EP SOLN
EPIDURAL | Status: AC
  Filled 2018-09-06: qty 250

## 2018-09-06 MED ORDER — ONDANSETRON HCL 4 MG/2ML IJ SOLN: 4.0000 mg | INTRAMUSCULAR | Status: DC | PRN

## 2018-09-06 MED ORDER — SODIUM CHLORIDE (PF) 0.9 % IJ SOLN
INTRAMUSCULAR | Status: DC | PRN
  Administered 2018-09-06: 12 mL/h via EPIDURAL

## 2018-09-06 NOTE — MAU Note (Signed)
CTX every 5 minutes.  No LOF/VB. +FM.  Past preterm delivery.

## 2018-09-06 NOTE — Anesthesia Postprocedure Evaluation (Signed)
Anesthesia Post Note  Patient: Haley Lopez  Procedure(s) Performed: AN AD HOC LABOR EPIDURAL     Patient location during evaluation: Mother Baby Anesthesia Type: Epidural Level of consciousness: awake and alert Pain management: pain level controlled Vital Signs Assessment: post-procedure vital signs reviewed and stable Respiratory status: spontaneous breathing, nonlabored ventilation and respiratory function stable Cardiovascular status: stable Postop Assessment: no headache, no backache and epidural receding Anesthetic complications: no    Last Vitals:  Vitals:   09/06/18 0551 09/06/18 0646  BP: 91/71 (!) 83/49  Pulse: 86 (!) 51  Resp: 18 18  Temp: 37 C 36.8 C  SpO2:      Last Pain:  Vitals:   09/06/18 0646  TempSrc: Oral  PainSc: 3    Pain Goal:                   [employer]

## 2018-09-06 NOTE — Discharge Summary (Addendum)
Postpartum Discharge Summary     Patient Name: Haley BreathKaiya Bayless DOB: 1995/12/29 MRN: [id number]  Date of admission: 09/06/2018 Delivering Provider: Cam HaiSHAW, KIMBERLY D   Date of discharge: 09/08/2018  Admitting diagnosis: 37 WKS, CTX Intrauterine pregnancy: 8079w2d     Secondary diagnosis:  Active Problems:   History of preterm delivery   HSV (herpes simplex virus) infection   Abnormal fetal ultrasound  Additional problems: none     Discharge diagnosis: Term Pregnancy Delivered                                                                                                Post partum procedures:None  Augmentation: none  Complications: None  Hospital course:  Onset of Labor With Vaginal Delivery     23 y.o. yo [phone] at 8579w2d was admitted in Active Labor on 09/06/2018. Patient had an uncomplicated labor course delivering 2.5hrs after arrival as follows:  Membrane Rupture Time/Date: 3:31 AM ,09/06/2018   Intrapartum Procedures: Episiotomy: None [1]                                         Lacerations:  None [1]  Patient had a delivery of a Viable infant. 09/06/2018  Information for the patient's newborn:  Kyra LeylandHolt, Boy Adrine [[id number]][[id number]]  Delivery Method: Vag-Spont    Pateint had an uncomplicated postpartum course.  She is ambulating, tolerating a regular diet, passing flatus, and urinating well. Patient is discharged home in stable condition on 09/08/18.   Magnesium Sulfate recieved: No BMZ received: No  Physical exam  Vitals:   09/07/18 0629 09/07/18 1421 09/07/18 2246 09/08/18 0520  BP: 105/71 115/75 97/68 123/82  Pulse: (!) 51 (!) 50 (!) 50 63  Resp: 18 20 18 16   Temp: 98.3 F (36.8 C) 98.4 F (36.9 C) 98.8 F (37.1 C) 98.3 F (36.8 C)  TempSrc: Oral Oral  Oral  SpO2: 100% 100% 100% 99%   General: alert, cooperative and no distress Lochia: appropriate Uterine Fundus: firm Incision: N/A DVT Evaluation: No evidence of DVT seen on physical exam. Labs: Lab Results   Component Value Date   WBC 8.5 09/06/2018   HGB 10.7 (L) 09/06/2018   HCT 32.5 (L) 09/06/2018   MCV 85.1 09/06/2018   PLT 240 09/06/2018   CMP Latest Ref Rng & Units 07/13/2018  Glucose 70 - 99 mg/dL 76  BUN 6 - 20 mg/dL <4(I<5(L)  Creatinine 3.470.44 - 1.00 mg/dL 4.250.49  Sodium 956135 - 387145 mmol/L 137  Potassium 3.5 - 5.1 mmol/L 3.2(L)  Chloride 98 - 111 mmol/L 104  CO2 22 - 32 mmol/L 23  Calcium 8.9 - 10.3 mg/dL 5.6(E8.4(L)  Total Protein 6.5 - 8.1 g/dL 6.1(L)  Total Bilirubin 0.3 - 1.2 mg/dL 0.4  Alkaline Phos 38 - 126 U/L 84  AST 15 - 41 U/L 22  ALT 0 - 44 U/L 14    Discharge instruction: per After Visit Summary and "Baby and Me Booklet".  After visit meds:  Allergies as of 09/08/2018  No Known Allergies     Medication List    TAKE these medications   ALPRAZolam 0.25 MG tablet Commonly known as:  Xanax Take 1 tablet (0.25 mg total) by mouth 3 (three) times daily as needed for anxiety.   ferrous sulfate 325 (65 FE) MG tablet Take 325 mg by mouth daily with breakfast.   ibuprofen 600 MG tablet Commonly known as:  ADVIL Take 1 tablet (600 mg total) by mouth every 6 (six) hours.   Prenatal Vitamins 0.8 MG tablet Take 1 tablet by mouth daily.   sertraline 25 MG tablet Commonly known as:  Zoloft Take 1 tablet (25 mg total) by mouth daily. Take 25 mg oral for 1 week then increase to 50 mg oral   valACYclovir 500 MG tablet Commonly known as:  VALTREX Take 1 tablet (500 mg total) by mouth 2 (two) times daily.       Diet: routine diet  Activity: Advance as tolerated. Pelvic rest for 6 weeks.   Outpatient follow up:4 weeks Follow up Appt: Future Appointments  Date Time Provider Department Center  10/04/2018 11:00 AM Sharyon Cable, CNM [facility] None   Follow up Visit: Follow-up Information    [facility] Follow up in 2 week(s).   Why:  For follow up on Zoloft  Contact information: [address] [place]  [address] [phone]          Please schedule this patient for Postpartum visit in: 4 weeks with the following provider: Any provider For C/S patients schedule nurse incision check in weeks 2 weeks: no Low risk pregnancy complicated by: hx preterm birth; tx care at 34wks Delivery mode:  SVD Anticipated Birth Control:  other/unsure- declines PP Procedures needed: none  Schedule Integrated BH visit: no Follow up visit in 2 weeks for mood check. Visit can be virtual. Patient started on zoloft.    Newborn Data: Live born female  Birth Weight: 5 lb 5 oz (2410 g) APGAR: 9, 9  Newborn Delivery   Birth date/time:  09/06/2018 03:37:00 Delivery type:  Vaginal, Spontaneous     Baby Feeding: Bottle Disposition:home with mother   09/08/2018 Venia Carbon, NP

## 2018-09-06 NOTE — Progress Notes (Signed)
Patient ID: Haley Lopez, female   DOB: 03-20-1996, 23 y.o.   MRN: [id number]  POSTPARTUM PROGRESS NOTE  Post Partum Day 1  Subjective:  Haley Lopez is a 23 y.o. [id number] s/p SVD at [redacted]w[redacted]d.  No acute events overnight.  Pt denies problems with ambulating, voiding or po intake.  She denies nausea or vomiting.  Pain is well controlled.  She has had flatus.   Lochia Small.   Objective: Blood pressure (!) 83/49, pulse (!) 51, temperature 98.3 F (36.8 C), temperature source Oral, resp. rate 18, last menstrual period 12/19/2017, SpO2 97 %.  Physical Exam:  General: alert, cooperative and no distress Chest: no respiratory distress Heart:regular rate, distal pulses intact Abdomen: soft, nontender,  Uterine Fundus: firm, appropriately tender DVT Evaluation: No calf swelling or tenderness Extremities: No edema Skin: warm, dry  Recent Labs    09/06/18 0115  HGB 10.7*  HCT 32.5*    Assessment/Plan: Haley Lopez is a 23 y.o. [id number] s/p SVD at [redacted]w[redacted]d   PPD#1 - Doing well Contraception: Declined  Feeding: Bottle Circ: outpatient, patient has info Dispo: Plan for discharge tomorrow 5/29   LOS: 0 days   Rasch, Harolyn Rutherford, NP 09/06/2018, 9:14 AM

## 2018-09-06 NOTE — H&P (Signed)
Haley Lopez is a 23 y.o. female [phone] @ 37.2wks by 5wk scan presenting for reg ctx at 0105. Denies leaking or bldg; no H/A, N/V or visual disturbances. 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 Anemia   . Anxiety   . Constipation   . Depression   . Vision abnormalities    Past Surgical History:  Procedure Laterality Date  . DILATION AND EVACUATION N/A 04/21/2016   Procedure: DILATATION AND EVACUATION;  Surgeon: Lavina Hamman, MD;  Location: [facility];  Service: Gynecology;  Laterality: N/A;  . WISDOM TOOTH EXTRACTION     Family History: family history includes Depression in her mother. Social History:  reports that she has never smoked. She has never used smokeless tobacco. She reports previous alcohol use. She reports previous drug use.     Maternal Diabetes: No Genetic Screening: Normal Maternal Ultrasounds/Referrals: Abnormal:  Findings:   Other: AC growth <10% at 35wks Fetal Ultrasounds or other Referrals:  None Maternal Substance Abuse:  No Significant Maternal Medications:  None Significant Maternal Lab Results:  Lab values include: Group B Strep negative Other Comments:  None  ROS History Dilation: 10 Effacement (%): 90 Station: Plus 2 Exam by:: [employer] RN Blood pressure 91/71, pulse 86, temperature 98.6 F (37 C), temperature source Oral, resp. rate 18, last menstrual period 12/19/2017, SpO2 97 %. Exam Physical Exam  Constitutional: She is oriented to person, place, and time. She appears well-developed.  HENT:  Head: Normocephalic.  Neck: Normal range of motion.   Cardiovascular: Normal rate.  Respiratory: Effort normal.  GI:  EFM 120s, +accels, no decels, occ variables Ctx q 2-5 mins  Musculoskeletal: Normal range of motion.  Neurological: She is alert and oriented to person, place, and time.  Skin: Skin is warm and dry.  Psychiatric: She has a normal mood and affect. Her behavior is normal. Thought content normal.    Prenatal labs: ABO, Rh: --/--/A POS, A POS Performed at [facility], [address] [place], [place] [address]  [phone]/28 0120) Antibody: NEG (05/28 0120) Rubella:   RPR:   NR HBsAg:   NR HIV:   NR GBS: Negative (05/22 1115)   Assessment/Plan: IUP@37 .2wks Active labor GBS neg  Admit to Labor & Delivery Expectant management Plans epidural Anticipate SVD   Haley Lopez CNM 09/06/2018, 6:28 AM  ** Delayed entry due to intermittent EPIC downtime during the night**

## 2018-09-06 NOTE — Plan of Care (Signed)
  Problem: Education: Goal: Knowledge of Childbirth will improve Outcome: Completed/Met Goal: Ability to make informed decisions regarding treatment and plan of care will improve Outcome: Completed/Met Goal: Ability to state and carry out methods to decrease the pain will improve Outcome: Completed/Met Goal: Individualized Educational Video(s) Outcome: Completed/Met   Problem: Coping: Goal: Ability to verbalize concerns and feelings about labor and delivery will improve Outcome: Completed/Met   Problem: Life Cycle: Goal: Ability to make normal progression through stages of labor will improve Outcome: Completed/Met Goal: Ability to effectively push during vaginal delivery will improve Outcome: Completed/Met   Problem: Role Relationship: Goal: Will demonstrate positive interactions with the child Outcome: Completed/Met   Problem: Safety: Goal: Risk of complications during labor and delivery will decrease Outcome: Completed/Met   Problem: Pain Management: Goal: Relief or control of pain from uterine contractions will improve Outcome: Completed/Met

## 2018-09-06 NOTE — Anesthesia Preprocedure Evaluation (Signed)
Anesthesia Evaluation  Patient identified by MRN, date of birth, ID band Patient awake    Reviewed: Allergy & Precautions, NPO status , Patient's Chart, lab work & pertinent test results  Airway Mallampati: II  TM Distance: >3 FB Neck ROM: Full    Dental no notable dental hx.    Pulmonary neg pulmonary ROS,    Pulmonary exam normal breath sounds clear to auscultation       Cardiovascular Exercise Tolerance: Good Normal cardiovascular exam Rhythm:Regular Rate:Normal     Neuro/Psych Anxiety Depression negative neurological ROS     GI/Hepatic negative GI ROS, Neg liver ROS,   Endo/Other  negative endocrine ROS  Renal/GU negative Renal ROS     Musculoskeletal   Abdominal   Peds  Hematology  (+) anemia , Hgb 10.7 Plt 240   Anesthesia Other Findings   Reproductive/Obstetrics (+) Pregnancy                             Anesthesia Physical Anesthesia Plan  ASA: II  Anesthesia Plan: Epidural   Post-op Pain Management:    Induction:   PONV Risk Score and Plan:   Airway Management Planned:   Additional Equipment:   Intra-op Plan:   Post-operative Plan:   Informed Consent: I have reviewed the patients History and Physical, chart, labs and discussed the procedure including the risks, benefits and alternatives for the proposed anesthesia with the patient or authorized representative who has indicated his/her understanding and acceptance.       Plan Discussed with:   Anesthesia Plan Comments:         Anesthesia Quick Evaluation

## 2018-09-06 NOTE — Anesthesia Procedure Notes (Signed)
Epidural Patient location during procedure: OB Start time: 09/06/2018 2:34 AM End time: 09/06/2018 2:48 AM  Staffing Anesthesiologist: Trevor Iha, MD Performed: anesthesiologist   Preanesthetic Checklist Completed: patient identified, site marked, surgical consent, pre-op evaluation, timeout performed, IV checked, risks and benefits discussed and monitors and equipment checked  Epidural Patient position: sitting Prep: site prepped and draped and DuraPrep Patient monitoring: continuous pulse ox and blood pressure Approach: midline Location: L3-L4 Injection technique: LOR air  Needle:  Needle type: Tuohy  Needle gauge: 17 G Needle length: 9 cm and 9 Needle insertion depth: 7 cm Catheter type: closed end flexible Catheter size: 19 Gauge Catheter at skin depth: 12 cm Test dose: negative  Assessment Events: blood not aspirated, injection not painful, no injection resistance, negative IV test and no paresthesia  Additional Notes Patient identified. Risks/Benefits/Options discussed with patient including but not limited to bleeding, infection, nerve damage, paralysis, failed block, incomplete pain control, headache, blood pressure changes, nausea, vomiting, reactions to medication both or allergic, itching and postpartum back pain. Confirmed with bedside nurse the patient's most recent platelet count. Confirmed with patient that they are not currently taking any anticoagulation, have any bleeding history or any family history of bleeding disorders. Patient expressed understanding and wished to proceed. All questions were answered. Sterile technique was used throughout the entire procedure. Please see nursing notes for vital signs. Test dose was given through epidural needle and negative prior to continuing to dose epidural or start infusion. Warning signs of high block given to the patient including shortness of breath, tingling/numbness in hands, complete motor block, or any  concerning symptoms with instructions to call for help. Patient was given instructions on fall risk and not to get out of bed. All questions and concerns addressed with instructions to call with any issues.1  Attempt (S) . Patient tolerated procedure well.

## 2018-09-07 ENCOUNTER — Encounter: Payer: Medicaid Other | Admitting: Obstetrics

## 2018-09-07 ENCOUNTER — Encounter (HOSPITAL_COMMUNITY): Payer: Self-pay

## 2018-09-07 LAB — RUBELLA SCREEN: Rubella: 2.31 index (ref >0.99)

## 2018-09-07 NOTE — Progress Notes (Signed)
CSW received consult for Edinburgh Score of 16 and for history of anxiety and depression.  CSW met with MOB to offer support and complete assessment.    MOB propped up in bed, performing skin-to-skin with infant, when CSW entered the room. FOB also present at bedside. CSW introduced self and received verbal permission to ask that FOB step out while CSW completed assessment. FOB left voluntarily. CSW explained reason for consult and MOB nodded in understanding. MOB pleasant, easy to engage and welcoming of [place] visit. When CSW asked how MOB was doing, MOB shared she was feeling relieved and tired at the same time. MOB acknowledged being emotional the day prior but is feeling ok today. CSW inquired about MOB's mental health history and MOB acknowledged being diagnosed with anxiety and depression when she was in highschool. MOB stated that her depression "never really went away". MOB described symptoms of feeling alone and being sad throughout her pregnancy and prior to pregnancy. MOB stated she received counseling at [facility] after her first pregnancy but is not currently receiving counseling or taking medications. MOB reported she has been prescribed Zoloft in the past but did not like how it made her feel. MOB expressed interest in being started on medications and gave CSW verbal permission to reach out to [facility] to see if they can come meet with her, which CSW has done. CSW provided education regarding the baby blues period vs. perinatal mood disorders, discussed treatment and gave resources for mental health follow up if concerns arise.  CSW recommends self-evaluation during the postpartum time period using the New Mom Checklist from Postpartum Progress and encouraged MOB to contact a medical professional if symptoms are noted at any time.  MOB denied any current SI, HI or DV and reported having support from her friends, her sister and her mother.   MOB reported having all essential items for infant  once discharged and reported infant would be sleeping in a basinet once home. CSW provided review of Sudden Infant Death Syndrome (SIDS) precautions and safe sleeping habits.    MOB denied any further questions, concerns or need for resources from [place]. CSW identifies no further need for intervention and no barriers to discharge at this time.  Ollen Barges, [place]  [facility] [employer] [phone]

## 2018-09-07 NOTE — Progress Notes (Signed)
Post Partum Day 1 Subjective: no complaints, up ad lib, voiding and tolerating PO  Objective: Blood pressure 105/71, pulse (!) 51, temperature 98.3 F (36.8 C), temperature source Oral, resp. rate 18, last menstrual period 12/19/2017, SpO2 100 %.  Physical Exam:  General: alert, cooperative and no distress Lochia: appropriate Uterine Fundus: firm Incision: n/a DVT Evaluation: No evidence of DVT seen on physical exam. Negative Homan's sign. No cords or calf tenderness. No significant calf/ankle edema.  Recent Labs    09/06/18 0115  HGB 10.7*  HCT 32.5*    Assessment/Plan: Plan for discharge tomorrow   LOS: 1 day   Haley Lopez 09/07/2018, 8:56 AM

## 2018-09-08 MED ORDER — IBUPROFEN 600 MG PO TABS: 600.0000 mg | ORAL_TABLET | Freq: Four times a day (QID) | ORAL | 0 refills | Status: DC

## 2018-09-08 MED ORDER — SERTRALINE HCL 25 MG PO TABS: 25.0000 mg | ORAL_TABLET | Freq: Every day | ORAL | 1 refills | Status: DC

## 2018-09-08 MED ORDER — ALPRAZOLAM 0.25 MG PO TABS: 0.2500 mg | ORAL_TABLET | Freq: Three times a day (TID) | ORAL | 0 refills | Status: DC | PRN

## 2018-09-08 NOTE — Discharge Instructions (Signed)
Vaginal Delivery, Care After °Refer to this sheet in the next few weeks. These instructions provide you with information about caring for yourself after vaginal delivery. Your health care provider may also give you more specific instructions. Your treatment has been planned according to current medical practices, but problems sometimes occur. Call your health care provider if you have any problems or questions. °What can I expect after the procedure? °After vaginal delivery, it is common to have: °· Some bleeding from your vagina. °· Soreness in your abdomen, your vagina, and the area of skin between your vaginal opening and your anus (perineum). °· Pelvic cramps. °· Fatigue. °Follow these instructions at home: °Medicines °· Take over-the-counter and prescription medicines only as told by your health care provider. °· If you were prescribed an antibiotic medicine, take it as told by your health care provider. Do not stop taking the antibiotic until it is finished. °Driving ° °· Do not drive or operate heavy machinery while taking prescription pain medicine. °· Do not drive for 24 hours if you received a sedative. °Lifestyle °· Do not drink alcohol. This is especially important if you are breastfeeding or taking medicine to relieve pain. °· Do not use tobacco products, including cigarettes, chewing tobacco, or e-cigarettes. If you need help quitting, ask your health care provider. °Eating and drinking °· Drink at least 8 eight-ounce glasses of water every day unless you are told not to by your health care provider. If you choose to breastfeed your baby, you may need to drink more water than this. °· Eat high-fiber foods every day. These foods may help prevent or relieve constipation. High-fiber foods include: °? Whole grain cereals and breads. °? Brown rice. °? Beans. °? Fresh fruits and vegetables. °Activity °· Return to your normal activities as told by your health care provider. Ask your health care provider what  activities are safe for you. °· Rest as much as possible. Try to rest or take a nap when your baby is sleeping. °· Do not lift anything that is heavier than your baby or 10 lb (4.5 kg) until your health care provider says that it is safe. °· Talk with your health care provider about when you can engage in sexual activity. This may depend on your: °? Risk of infection. °? Rate of healing. °? Comfort and desire to engage in sexual activity. °Vaginal Care °· If you have an episiotomy or a vaginal tear, check the area every day for signs of infection. Check for: °? More redness, swelling, or pain. °? More fluid or blood. °? Warmth. °? Pus or a bad smell. °· Do not use tampons or douches until your health care provider says this is safe. °· Watch for any blood clots that may pass from your vagina. These may look like clumps of dark red, brown, or black discharge. °General instructions °· Keep your perineum clean and dry as told by your health care provider. °· Wear loose, comfortable clothing. °· Wipe from front to back when you use the toilet. °· Ask your health care provider if you can shower or take a bath. If you had an episiotomy or a perineal tear during labor and delivery, your health care provider may tell you not to take baths for a certain length of time. °· Wear a bra that supports your breasts and fits you well. °· If possible, have someone help you with household activities and help care for your baby for at least a few days after you   leave the hospital. °· Keep all follow-up visits for you and your baby as told by your health care provider. This is important. °Contact a health care provider if: °· You have: °? Vaginal discharge that has a bad smell. °? Difficulty urinating. °? Pain when urinating. °? A sudden increase or decrease in the frequency of your bowel movements. °? More redness, swelling, or pain around your episiotomy or vaginal tear. °? More fluid or blood coming from your episiotomy or vaginal  tear. °? Pus or a bad smell coming from your episiotomy or vaginal tear. °? A fever. °? A rash. °? Little or no interest in activities you used to enjoy. °? Questions about caring for yourself or your baby. °· Your episiotomy or vaginal tear feels warm to the touch. °· Your episiotomy or vaginal tear is separating or does not appear to be healing. °· Your breasts are painful, hard, or turn red. °· You feel unusually sad or worried. °· You feel nauseous or you vomit. °· You pass large blood clots from your vagina. If you pass a blood clot from your vagina, save it to show to your health care provider. Do not flush blood clots down the toilet without having your health care provider look at them. °· You urinate more than usual. °· You are dizzy or light-headed. °· You have not breastfed at all and you have not had a menstrual period for 12 weeks after delivery. °· You have stopped breastfeeding and you have not had a menstrual period for 12 weeks after you stopped breastfeeding. °Get help right away if: °· You have: °? Pain that does not go away or does not get better with medicine. °? Chest pain. °? Difficulty breathing. °? Blurred vision or spots in your vision. °? Thoughts about hurting yourself or your baby. °· You develop pain in your abdomen or in one of your legs. °· You develop a severe headache. °· You faint. °· You bleed from your vagina so much that you fill two sanitary pads in one hour. °This information is not intended to replace advice given to you by your health care provider. Make sure you discuss any questions you have with your health care provider. °Document Released: 03/25/2000 Document Revised: 09/09/2015 Document Reviewed: 04/12/2015 °Elsevier Interactive Patient Education © 2019 Elsevier Inc. ° °

## 2018-09-17 ENCOUNTER — Encounter (HOSPITAL_COMMUNITY): Payer: Self-pay

## 2018-09-17 ENCOUNTER — Ambulatory Visit (HOSPITAL_COMMUNITY): Payer: Medicaid Other

## 2018-09-20 ENCOUNTER — Encounter: Payer: Self-pay | Admitting: Advanced Practice Midwife

## 2018-09-20 ENCOUNTER — Ambulatory Visit (INDEPENDENT_AMBULATORY_CARE_PROVIDER_SITE_OTHER): Payer: Medicaid Other | Admitting: Advanced Practice Midwife

## 2018-09-20 DIAGNOSIS — Z1389 Encounter for screening for other disorder: Secondary | ICD-10-CM | POA: Diagnosis not present

## 2018-09-20 DIAGNOSIS — Z8659 Personal history of other mental and behavioral disorders: Secondary | ICD-10-CM
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
## 2018-09-20 NOTE — Progress Notes (Signed)
I connected with Haley Lopez on 09/20/18 at  9:15 AM EDT by telephone and verified that I am speaking with the correct person using two identifiers.   PP pt feeling overwhelmed with newborn and 23 yr old EPDS= 21

## 2018-09-20 NOTE — Patient Instructions (Signed)

## 2018-10-01 ENCOUNTER — Telehealth: Payer: Self-pay

## 2018-10-01 NOTE — Telephone Encounter (Signed)
  Left VM message to call Office.

## 2018-10-04 ENCOUNTER — Telehealth (INDEPENDENT_AMBULATORY_CARE_PROVIDER_SITE_OTHER): Payer: Medicaid Other | Admitting: Obstetrics

## 2018-10-04 ENCOUNTER — Telehealth: Payer: Self-pay

## 2018-10-04 ENCOUNTER — Encounter: Payer: Self-pay | Admitting: Obstetrics

## 2018-10-04 DIAGNOSIS — Z30013 Encounter for initial prescription of injectable contraceptive: Secondary | ICD-10-CM

## 2018-10-04 DIAGNOSIS — O99345 Other mental disorders complicating the puerperium: Secondary | ICD-10-CM

## 2018-10-04 DIAGNOSIS — F53 Postpartum depression: Secondary | ICD-10-CM

## 2018-10-04 DIAGNOSIS — Z3009 Encounter for other general counseling and advice on contraception: Secondary | ICD-10-CM

## 2018-10-04 MED ORDER — MEDROXYPROGESTERONE ACETATE 150 MG/ML IM SUSP: 150.0000 mg | INTRAMUSCULAR | 4 refills | Status: DC
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
## 2018-10-04 NOTE — Telephone Encounter (Signed)
  Patient returned call regarding her depression.  She has an appointment today, I told her to let the Provider know what was happening and she will treat accordingly.

## 2018-11-14 ENCOUNTER — Telehealth: Payer: Self-pay

## 2018-11-14 NOTE — Telephone Encounter (Signed)
Patient called and would like to know if she can have a refill on xanax for her anxiety.

## 2019-04-10 ENCOUNTER — Emergency Department (HOSPITAL_COMMUNITY)
Admission: EM | Admit: 2019-04-10 | Discharge: 2019-04-10 | Discharge status: Absent without leave | Payer: Medicaid Other

## 2019-04-10 NOTE — ED Notes (Signed)
Pt states that they had an emergency come up and is leaving. This NT received labels and pt left.

## 2019-04-12 NOTE — L&D Delivery Note (Signed)
OB/GYN Faculty Practice Delivery Note  Haley Lopez is a 24 y.o. [id number] s/p vaginal delivery at [redacted]w[redacted]d. She was admitted for spontaneous labor s/p PPROM.   ROM: 2h 36m with clear fluid GBS Status: positive (started on ampicillin in MAU) Maximum Maternal Temperature: 98.72F  Labor Progress: Pt reports SROM at 1240AM on 01/20/20 at home prior to arrival to MAU. Pt continued to progress and was found to have complete cervical dilation on arrival to L&D. She had brief second stage and then delivered without complication as noted below.  Delivery Date/Time: 3846 on 01/20/20 Delivery: Called to room and patient was complete and pushing. Head delivered ROA. No nuchal cord present. Shoulder and body delivered in usual fashion. Infant with spontaneous cry, placed on mother's abdomen, dried and stimulated. Cord clamped x 2 after 1-minute delay, and cut by infant's grandmother under my direct supervision. Cord blood drawn. Placenta delivered spontaneously with gentle cord traction. Fundus firm with massage and Pitocin. Labia, perineum, vagina, and cervix were inspected without evidence of lacerations.   Placenta: intact, 3-vessel cord Complications: none Lacerations: none EBL: 25 ml Analgesia: none given precipitous delivery  Post-Placental IUD Insertion Procedure Note  Patient identified, informed consent signed prior to delivery, signed copy in chart, time out was performed.    - Liletta IUD grasped between sterile gloved fingers. Sterile lubrication applied to sterile gloved hand for ease of insertion. Fundus identified through abdominal wall using non-insertion hand. IUD inserted to fundus with bimanual technique. IUD carefully released at the fundus and insertion hand gently removed from vagina.    Strings trimmed to the level of the introitus. Patient tolerated procedure well.  Lot # [id number] Expiration Date 05/2023  Patient given post procedure instructions and IUD care card with expiration  date.  Patient is asked to keep IUD strings tucked in her vagina until her postpartum follow up visit in 4-6 weeks. Patient advised to abstain from sexual intercourse and pulling on strings before her follow-up visit. Patient verbalized an understanding of the plan of care and agrees.   Infant: female  APGARs 9 & 9  weight per medical record  Lynnda Shields, MD OB/GYN Fellow, Faculty Practice

## 2019-06-04 ENCOUNTER — Other Ambulatory Visit: Payer: Self-pay

## 2019-06-04 ENCOUNTER — Inpatient Hospital Stay (HOSPITAL_COMMUNITY)
Admission: AD | Admit: 2019-06-04 | Discharge: 2019-06-04 | Disposition: A | Discharge status: Home / Self Care | Payer: Medicaid Other | Attending: Obstetrics and Gynecology | Admitting: Obstetrics and Gynecology

## 2019-06-04 ENCOUNTER — Inpatient Hospital Stay (HOSPITAL_COMMUNITY): Payer: Medicaid Other

## 2019-06-04 ENCOUNTER — Encounter (HOSPITAL_COMMUNITY): Payer: Self-pay | Admitting: Obstetrics and Gynecology

## 2019-06-04 DIAGNOSIS — O3680X0 Pregnancy with inconclusive fetal viability, not applicable or unspecified: Secondary | ICD-10-CM

## 2019-06-04 DIAGNOSIS — R102 Pelvic and perineal pain: Secondary | ICD-10-CM | POA: Diagnosis not present

## 2019-06-04 DIAGNOSIS — D649 Anemia, unspecified: Secondary | ICD-10-CM | POA: Diagnosis not present

## 2019-06-04 DIAGNOSIS — O99011 Anemia complicating pregnancy, first trimester: Secondary | ICD-10-CM | POA: Insufficient documentation

## 2019-06-04 DIAGNOSIS — N898 Other specified noninflammatory disorders of vagina: Secondary | ICD-10-CM | POA: Diagnosis not present

## 2019-06-04 DIAGNOSIS — O26891 Other specified pregnancy related conditions, first trimester: Secondary | ICD-10-CM | POA: Diagnosis not present

## 2019-06-04 DIAGNOSIS — Z3A01 Less than 8 weeks gestation of pregnancy: Secondary | ICD-10-CM | POA: Diagnosis not present

## 2019-06-04 DIAGNOSIS — F419 Anxiety disorder, unspecified: Secondary | ICD-10-CM | POA: Insufficient documentation

## 2019-06-04 DIAGNOSIS — O99340 Other mental disorders complicating pregnancy, unspecified trimester: Secondary | ICD-10-CM | POA: Insufficient documentation

## 2019-06-04 DIAGNOSIS — Z79899 Other long term (current) drug therapy: Secondary | ICD-10-CM | POA: Insufficient documentation

## 2019-06-04 DIAGNOSIS — O26899 Other specified pregnancy related conditions, unspecified trimester: Secondary | ICD-10-CM

## 2019-06-04 DIAGNOSIS — F329 Major depressive disorder, single episode, unspecified: Secondary | ICD-10-CM | POA: Insufficient documentation

## 2019-06-04 HISTORY — DX: Herpesviral infection, unspecified: B00.9

## 2019-06-04 LAB — COMPREHENSIVE METABOLIC PANEL
ALT: 16 U/L (ref 0–44)
AST: 18 U/L (ref 15–41)
Albumin: 4.1 g/dL (ref 3.5–5.0)
Alkaline Phosphatase: 42 U/L (ref 38–126)
Anion gap: 10 (ref 5–15)
BUN: 9 mg/dL (ref 6–20)
CO2: 24 mmol/L (ref 22–32)
Calcium: 9.7 mg/dL (ref 8.9–10.3)
Chloride: 104 mmol/L (ref 98–111)
Creatinine, Ser: 0.74 mg/dL (ref 0.44–1.00)
GFR calc Af Amer: >60 mL/min (ref >60)
GFR calc non Af Amer: >60 mL/min (ref >60)
Glucose, Bld: 96 mg/dL (ref 70–99)
Potassium: 4.4 mmol/L (ref 3.5–5.1)
Sodium: 138 mmol/L (ref 135–145)
Total Bilirubin: 0.5 mg/dL (ref 0.3–1.2)
Total Protein: 7.2 g/dL (ref 6.5–8.1)

## 2019-06-04 LAB — CBC WITH DIFFERENTIAL/PLATELET
Abs Immature Granulocytes: 0.01 10*3/uL (ref 0.00–0.07)
Basophils Absolute: 0 10*3/uL (ref 0.0–0.1)
Basophils Relative: 1 %
Eosinophils Absolute: 0.1 10*3/uL (ref 0.0–0.5)
Eosinophils Relative: 2 %
HCT: 41 % (ref 36.0–46.0)
Hemoglobin: 13.3 g/dL (ref 12.0–15.0)
Immature Granulocytes: 0 %
Lymphocytes Relative: 38 %
Lymphs Abs: 2.5 10*3/uL (ref 0.7–4.0)
MCH: 27.1 pg (ref 26.0–34.0)
MCHC: 32.4 g/dL (ref 30.0–36.0)
MCV: 83.5 fL (ref 80.0–100.0)
Monocytes Absolute: 0.5 10*3/uL (ref 0.1–1.0)
Monocytes Relative: 8 %
Neutro Abs: 3.4 10*3/uL (ref 1.7–7.7)
Neutrophils Relative %: 51 %
Platelets: 324 10*3/uL (ref 150–400)
RBC: 4.91 MIL/uL (ref 3.87–5.11)
RDW: 13.7 % (ref 11.5–15.5)
WBC: 6.5 10*3/uL (ref 4.0–10.5)
nRBC: 0 % (ref 0.0–0.2)

## 2019-06-04 LAB — HIV ANTIBODY (ROUTINE TESTING W REFLEX): HIV Screen 4th Generation wRfx: NONREACTIVE

## 2019-06-04 LAB — URINALYSIS, ROUTINE W REFLEX MICROSCOPIC
Bilirubin Urine: NEGATIVE
Glucose, UA: NEGATIVE mg/dL
Hgb urine dipstick: NEGATIVE
Ketones, ur: NEGATIVE mg/dL
Leukocytes,Ua: NEGATIVE
Nitrite: NEGATIVE
Protein, ur: NEGATIVE mg/dL
Specific Gravity, Urine: 1.015 (ref 1.005–1.030)
pH: 7 (ref 5.0–8.0)

## 2019-06-04 LAB — HCG, QUANTITATIVE, PREGNANCY: hCG, Beta Chain, Quant, S: 47 m[IU]/mL — ABNORMAL HIGH (ref <5)

## 2019-06-04 LAB — WET PREP, GENITAL
Sperm: NONE SEEN
Trich, Wet Prep: NONE SEEN
Yeast Wet Prep HPF POC: NONE SEEN

## 2019-06-04 LAB — POCT PREGNANCY, URINE: Preg Test, Ur: NEGATIVE

## 2019-06-04 LAB — HCG, SERUM, QUALITATIVE: Preg, Serum: POSITIVE — AB
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
## 2019-06-04 NOTE — MAU Provider Note (Signed)
History     CSN: [id number]  Arrival date and time: 06/04/19 1009   First Provider Initiated Contact with Patient 06/04/19 1433      Chief Complaint  Patient presents with  . Abdominal Pain  . Possible Pregnancy   Possible Pregnancy Pertinent negatives include no chills, fever, nausea or vomiting.  Pelvic Pain The patient's primary symptoms include pelvic pain. The patient's pertinent negatives include no vaginal discharge. This is a new problem. The current episode started in the past 7 days. The problem occurs intermittently. The problem has been unchanged. The problem affects both sides. She is pregnant. Pertinent negatives include no chills, dysuria, fever, frequency, nausea or vomiting. The vaginal discharge was normal. There has been no bleeding. Nothing aggravates the symptoms. She has tried nothing for the symptoms. Her menstrual history has been regular (LMP 05/10/19).    OB History    Gravida  6   Para  2   Term  1   Preterm  1   AB  3   Living  2     SAB  3   TAB  0   Ectopic  0   Multiple  0   Live Births  2           Past Medical History:  Diagnosis Date  . Anemia   . Anxiety   . Constipation   . Depression   . Herpes   . Vision abnormalities     Past Surgical History:  Procedure Laterality Date  . DILATION AND EVACUATION N/A 04/21/2016   Procedure: DILATATION AND EVACUATION;  Surgeon: Lavina Hamman, MD;  Location: [facility];  Service: Gynecology;  Laterality: N/A;  . WISDOM TOOTH EXTRACTION      Family History  Problem Relation Age of Onset  . Depression Mother     Social History   Tobacco Use  . Smoking status: Never Smoker  . Smokeless tobacco: Never Used  Substance Use Topics  . Alcohol use: Not Currently    Comment: social  . Drug use: Yes    Frequency: 14.0 times per week    Allergies:  Allergies  Allergen Reactions  . Latex     Rash with condoms    Medications Prior to Admission  Medication Sig Dispense Refill  Last Dose  . ibuprofen (ADVIL) 600 MG tablet Take 1 tablet (600 mg total) by mouth every 6 (six) hours. 30 tablet 0 Past Month at Unknown time  . ALPRAZolam (XANAX) 0.25 MG tablet Take 1 tablet (0.25 mg total) by mouth 3 (three) times daily as needed for anxiety. 30 tablet 0 More than a month at Unknown time  . ferrous sulfate 325 (65 FE) MG tablet Take 325 mg by mouth daily with breakfast.     . medroxyPROGESTERone (DEPO-PROVERA) 150 MG/ML injection Inject 1 mL (150 mg total) into the muscle every 3 (three) months. 1 mL 4   . Prenatal Multivit-Min-Fe-FA (PRENATAL VITAMINS) 0.8 MG tablet Take 1 tablet by mouth daily. (Patient not taking: Reported on 10/04/2018) 90 tablet 12   . sertraline (ZOLOFT) 25 MG tablet Take 1 tablet (25 mg total) by mouth daily. Take 25 mg oral for 1 week then increase to 50 mg oral 90 tablet 1   . valACYclovir (VALTREX) 500 MG tablet Take 1 tablet (500 mg total) by mouth 2 (two) times daily. (Patient not taking: Reported on 09/20/2018) 60 tablet 6     Review of Systems  Constitutional: Negative for chills and fever.  Gastrointestinal:  Negative for nausea and vomiting.  Genitourinary: Positive for pelvic pain. Negative for dysuria, frequency, vaginal bleeding and vaginal discharge.   Physical Exam   Blood pressure 131/81, pulse 62, temperature 98.8 F (37.1 C), temperature source Oral, resp. rate 16, height 5\' 1"  (1.549 m), weight 49.2 kg, last menstrual period 05/10/2019, SpO2 100 %, not currently breastfeeding.  Physical Exam  Nursing note and vitals reviewed. Constitutional: She is oriented to person, place, and time. She appears well-developed and well-nourished. No distress.  HENT:  Head: Normocephalic.  Cardiovascular: Normal rate.  Respiratory: Effort normal.  GI: Soft. There is abdominal tenderness. There is no rebound.  Neurological: She is alert and oriented to person, place, and time.  Skin: Skin is warm and dry.  Psychiatric: She has a normal mood and  affect.    Results for orders placed or performed during the hospital encounter of 06/04/19 (from the past 24 hour(s))  Pregnancy, urine POC     Status: None   Collection Time: 06/04/19 11:21 AM  Result Value Ref Range   Preg Test, Ur NEGATIVE NEGATIVE  hCG, serum, qualitative     Status: Abnormal   Collection Time: 06/04/19 11:39 AM  Result Value Ref Range   Preg, Serum POSITIVE (A) NEGATIVE  CBC with Differential/Platelet     Status: None   Collection Time: 06/04/19  1:06 PM  Result Value Ref Range   WBC 6.5 4.0 - 10.5 K/uL   RBC 4.91 3.87 - 5.11 MIL/uL   Hemoglobin 13.3 12.0 - 15.0 g/dL   HCT 06/06/19 54.6 - 27.0 %   MCV 83.5 80.0 - 100.0 fL   MCH 27.1 26.0 - 34.0 pg   MCHC 32.4 30.0 - 36.0 g/dL   RDW 35.0 09.3 - 81.8 %   Platelets 324 150 - 400 K/uL   nRBC 0.0 0.0 - 0.2 %   Neutrophils Relative % 51 %   Neutro Abs 3.4 1.7 - 7.7 K/uL   Lymphocytes Relative 38 %   Lymphs Abs 2.5 0.7 - 4.0 K/uL   Monocytes Relative 8 %   Monocytes Absolute 0.5 0.1 - 1.0 K/uL   Eosinophils Relative 2 %   Eosinophils Absolute 0.1 0.0 - 0.5 K/uL   Basophils Relative 1 %   Basophils Absolute 0.0 0.0 - 0.1 K/uL   Immature Granulocytes 0 %   Abs Immature Granulocytes 0.01 0.00 - 0.07 K/uL  hCG, quantitative, pregnancy     Status: Abnormal   Collection Time: 06/04/19  1:06 PM  Result Value Ref Range   hCG, Beta Chain, Quant, S 47 (H) <5 mIU/mL  HIV Antibody (routine testing w rflx)     Status: None   Collection Time: 06/04/19  1:06 PM  Result Value Ref Range   HIV Screen 4th Generation wRfx NON REACTIVE NON REACTIVE  Comprehensive metabolic panel     Status: None   Collection Time: 06/04/19  1:06 PM  Result Value Ref Range   Sodium 138 135 - 145 mmol/L   Potassium 4.4 3.5 - 5.1 mmol/L   Chloride 104 98 - 111 mmol/L   CO2 24 22 - 32 mmol/L   Glucose, Bld 96 70 - 99 mg/dL   BUN 9 6 - 20 mg/dL   Creatinine, Ser 06/06/19 0.44 - 1.00 mg/dL   Calcium 9.7 8.9 - 3.71 mg/dL   Total Protein 7.2 6.5 -  8.1 g/dL   Albumin 4.1 3.5 - 5.0 g/dL   AST 18 15 - 41 U/L   ALT  16 0 - 44 U/L   Alkaline Phosphatase 42 38 - 126 U/L   Total Bilirubin 0.5 0.3 - 1.2 mg/dL   GFR calc non Af Amer >60 >60 mL/min   GFR calc Af Amer >60 >60 mL/min   Anion gap 10 5 - 15  Wet prep, genital     Status: Abnormal   Collection Time: 06/04/19  2:34 PM   Specimen: Vaginal  Result Value Ref Range   Yeast Wet Prep HPF POC NONE SEEN NONE SEEN   Trich, Wet Prep NONE SEEN NONE SEEN   Clue Cells Wet Prep HPF POC PRESENT (A) NONE SEEN   WBC, Wet Prep HPF POC MODERATE (A) NONE SEEN   Sperm NONE SEEN    US OB LESS THAN 14 WEEKS WITH OB TRANSVAGINAL  Result Date: 06/04/2019 CLINICAL DATA:  Abdominal pain and cramping for 1 week. 3 weeks 4 days by LMP. EXAM: OBSTETRIC <14 WK [place] AND TRANSVAGINAL OB US TECHNIQUE: Both transabdominal and transvaginal ultrasound examinations were performed for complete evaluation of the gestation as well as the maternal uterus, adnexal regions, and pelvic cul-de-sac. Transvaginal technique was performed to assess early pregnancy. COMPARISON:  None. FINDINGS: Intrauterine gestational sac: None Maternal uterus/adnexae: Endometrial thickness measures 10 mm. No fibroids identified. Both ovaries are normal appearance. No adnexal mass identified. Tiny amount of simple fluid noted in pelvic cul-de-sac. IMPRESSION: Pregnancy of unknown anatomic location (no intrauterine gestational sac or adnexal mass identified). Differential diagnosis includes recent spontaneous abortion, IUP too early to visualize, and non-visualized ectopic pregnancy. Recommend correlation with serial beta-hCG levels, and follow up US if warranted clinically. Electronically Signed   By: Marlaine Hind M.D.   On: 06/04/2019 15:10    MAU Course  Procedures  MDM   Assessment and Plan   1. Pregnancy of unknown anatomic location   2. Abdominal pain in pregnancy    DC home Comfort measures reviewed  1st Trimester precautions   Bleeding precautions Ectopic precautions RX: none  Return to MAU as needed Repeat HCG in 48 hours at [facility] office   [place] Follow up.   Why: Thursday 2/25 at 1:00pm for repeat blood work  Contact information: [address] [phone] [place] DNP, [place]  06/04/19  3:38 PM

## 2019-06-04 NOTE — Discharge Instructions (Signed)

## 2019-06-05 LAB — GC/CHLAMYDIA PROBE AMP (~~LOC~~) NOT AT ARMC
Chlamydia: NEGATIVE
Comment: NEGATIVE
Comment: NORMAL
Neisseria Gonorrhea: NEGATIVE

## 2019-06-06 ENCOUNTER — Telehealth: Payer: Self-pay | Admitting: *Deleted

## 2019-06-06 ENCOUNTER — Other Ambulatory Visit: Payer: Self-pay

## 2019-06-06 ENCOUNTER — Ambulatory Visit (INDEPENDENT_AMBULATORY_CARE_PROVIDER_SITE_OTHER): Payer: Medicaid Other

## 2019-06-06 VITALS — BP 116/76 | HR 63 | Ht 61.0 in | Wt 108.0 lb

## 2019-06-06 DIAGNOSIS — Z3A00 Weeks of gestation of pregnancy not specified: Secondary | ICD-10-CM

## 2019-06-06 DIAGNOSIS — Z3200 Encounter for pregnancy test, result unknown: Secondary | ICD-10-CM

## 2019-06-06 DIAGNOSIS — O3680X0 Pregnancy with inconclusive fetal viability, not applicable or unspecified: Secondary | ICD-10-CM

## 2019-06-06 NOTE — Progress Notes (Signed)
Ultrasounds Results Note  SUBJECTIVE HPI:  [name] is a 24 y.o. [id number] at Unknown by LMP who presents to the for followup quant HCG. The patient denies vaginal bleeding and reports lower abdominal pain unchanged from previous.  Upon review of the patient's records, patient was first seen in MAU on 2/23 for lower abdominal pain bilaterally.   BHCG on that day was 47.  Ultrasound showed pregnancy of unknown location.    Past Medical History:  Diagnosis Date  . Anemia   . Anxiety   . Constipation   . Depression   . Herpes   . Vision abnormalities    Past Surgical History:  Procedure Laterality Date  . DILATION AND EVACUATION N/A 04/21/2016   Procedure: DILATATION AND EVACUATION;  Surgeon: Cheri Fowler, MD;  Location: [facility];  Service: Gynecology;  Laterality: N/A;  . WISDOM TOOTH EXTRACTION     Social History   Socioeconomic History  . Marital status: Single    Spouse name: Not on file  . Number of children: Not on file  . Years of education: Not on file  . Highest education level: Not on file  Occupational History  . Not on file  Tobacco Use  . Smoking status: Never Smoker  . Smokeless tobacco: Never Used  Substance and Sexual Activity  . Alcohol use: Not Currently    Comment: social  . Drug use: Yes    Frequency: 14.0 times per week  . Sexual activity: Yes    Birth control/protection: None  Other Topics Concern  . Not on file  Social History Narrative  . Not on file   Social Determinants of Health   Financial Resource Strain:   . Difficulty of Paying Living Expenses: Not on file  Food Insecurity:   . Worried About Charity fundraiser in the Last Year: Not on file  . Ran Out of Food in the Last Year: Not on file  Transportation Needs:   . Lack of Transportation (Medical): Not on file  . Lack of Transportation (Non-Medical): Not on file  Physical Activity:   . Days of Exercise per Week: Not on file  . Minutes of Exercise per Session: Not on file   Stress:   . Feeling of Stress : Not on file  Social Connections:   . Frequency of Communication with Friends and Family: Not on file  . Frequency of Social Gatherings with Friends and Family: Not on file  . Attends Religious Services: Not on file  . Active Member of Clubs or Organizations: Not on file  . Attends Archivist Meetings: Not on file  . Marital Status: Not on file  Intimate Partner Violence:   . Fear of Current or Ex-Partner: Not on file  . Emotionally Abused: Not on file  . Physically Abused: Not on file  . Sexually Abused: Not on file   Current Outpatient Medications on File Prior to Visit  Medication Sig Dispense Refill  . sertraline (ZOLOFT) 25 MG tablet Take 1 tablet (25 mg total) by mouth daily. Take 25 mg oral for 1 week then increase to 50 mg oral 90 tablet 1   No current facility-administered medications on file prior to visit.   Allergies  Allergen Reactions  . Latex     Rash with condoms    I have reviewed patient's Past Medical Hx, Surgical Hx, Family Hx, Social Hx, medications and allergies.   Review of Systems Review of Systems  Constitutional: Negative for fever and  chills.  Gastrointestinal: Negative for nausea, vomiting, abdominal pain, diarrhea and constipation.  Genitourinary: Negative for dysuria.  Musculoskeletal: Negative for back pain.  Neurological: Negative for dizziness and weakness.    Physical Exam  BP 116/76   Pulse 63   Ht 5\' 1"  (1.549 m)   Wt 108 lb (49 kg)   LMP 05/10/2019   BMI 20.41 kg/m   GENERAL: Well-developed, well-nourished female in no acute distress.  ABDOMEN: soft, non-tender SKIN: Warm, dry and without erythema PSYCH: Normal mood and affect NEURO: Alert and oriented x 4  LAB RESULTS No results found for this or any previous visit (from the past 24 hour(s)).  06/06/19 Quant HCG 108  IMAGING 06/08/19 OB LESS THAN 14 WEEKS WITH OB TRANSVAGINAL  Result Date: 06/04/2019 CLINICAL DATA:  Abdominal pain and  cramping for 1 week. 3 weeks 4 days by LMP. EXAM: OBSTETRIC <14 WK 06/06/2019 AND TRANSVAGINAL OB Haley TECHNIQUE: Both transabdominal and transvaginal ultrasound examinations were performed for complete evaluation of the gestation as well as the maternal uterus, adnexal regions, and pelvic cul-de-sac. Transvaginal technique was performed to assess early pregnancy. COMPARISON:  None. FINDINGS: Intrauterine gestational sac: None Maternal uterus/adnexae: Endometrial thickness measures 10 mm. No fibroids identified. Both ovaries are normal appearance. No adnexal mass identified. Tiny amount of simple fluid noted in pelvic cul-de-sac. IMPRESSION: Pregnancy of unknown anatomic location (no intrauterine gestational sac or adnexal mass identified). Differential diagnosis includes recent spontaneous abortion, IUP too early to visualize, and non-visualized ectopic pregnancy. Recommend correlation with serial beta-hCG levels, and follow up Haley if warranted clinically. Electronically Signed   By: Haley M.D.   On: 06/04/2019 15:10    ASSESSMENT 1. Possible pregnancy     PLAN Discharge home in stable condition Patient advised to start/continue taking prenatal vitamins Patient will be scheduled for viability ultrasound in 7 days  Haley Walgren, MD  06/06/2019  3:11 PM

## 2019-06-06 NOTE — Telephone Encounter (Signed)
Notified patient of BHCG results.  Reviewed ectopic precautions and when to return to [facility].  Will need repeat ultrasound in seven days. Patient prefers morning.  Will have MyChart visit for ultrasound results that afternoon.

## 2019-06-06 NOTE — Progress Notes (Signed)
Patient ID: Haley Lopez, female   DOB: 11/07/1995, 24 y.o.   MRN: [id number] Patient seen and assessed by nursing staff during this encounter. I have reviewed the chart and agree with the documentation and plan.  Scheryl Darter, MD 06/06/2019 3:21 PM

## 2019-06-06 NOTE — Addendum Note (Signed)
Addended by: Catalina Antigua on: 06/06/2019 03:11 PM   Modules accepted: Orders

## 2019-06-06 NOTE — Progress Notes (Addendum)
SUBJECTIVE Presents for STAT Beta Hcg. Patient denies bleeding or LOF.  C/o intermittent cramping 6/10.  PLAN STAT Beta HCG drawn and sent to Lab. Patient will be called when results are received.

## 2019-06-07 ENCOUNTER — Encounter (HOSPITAL_BASED_OUTPATIENT_CLINIC_OR_DEPARTMENT_OTHER): Payer: Self-pay

## 2019-06-07 ENCOUNTER — Emergency Department (HOSPITAL_BASED_OUTPATIENT_CLINIC_OR_DEPARTMENT_OTHER)
Admission: EM | Admit: 2019-06-07 | Discharge: 2019-06-07 | Disposition: A | Discharge status: Home / Self Care | Payer: Medicaid Other | Attending: Emergency Medicine | Admitting: Emergency Medicine

## 2019-06-07 ENCOUNTER — Other Ambulatory Visit: Payer: Self-pay

## 2019-06-07 DIAGNOSIS — Z3A01 Less than 8 weeks gestation of pregnancy: Secondary | ICD-10-CM | POA: Diagnosis not present

## 2019-06-07 DIAGNOSIS — O99891 Other specified diseases and conditions complicating pregnancy: Secondary | ICD-10-CM | POA: Diagnosis present

## 2019-06-07 DIAGNOSIS — R1032 Left lower quadrant pain: Secondary | ICD-10-CM | POA: Diagnosis not present

## 2019-06-07 DIAGNOSIS — O23591 Infection of other part of genital tract in pregnancy, first trimester: Secondary | ICD-10-CM | POA: Diagnosis not present

## 2019-06-07 DIAGNOSIS — B9689 Other specified bacterial agents as the cause of diseases classified elsewhere: Secondary | ICD-10-CM

## 2019-06-07 LAB — URINALYSIS, ROUTINE W REFLEX MICROSCOPIC
Bilirubin Urine: NEGATIVE
Glucose, UA: NEGATIVE mg/dL
Hgb urine dipstick: NEGATIVE
Ketones, ur: NEGATIVE mg/dL
Leukocytes,Ua: NEGATIVE
Nitrite: NEGATIVE
Protein, ur: NEGATIVE mg/dL
Specific Gravity, Urine: 1.02 (ref 1.005–1.030)
pH: 7 (ref 5.0–8.0)

## 2019-06-07 LAB — PREGNANCY, URINE: Preg Test, Ur: POSITIVE — AB

## 2019-06-07 LAB — WET PREP, GENITAL
Sperm: NONE SEEN
Trich, Wet Prep: NONE SEEN
Yeast Wet Prep HPF POC: NONE SEEN

## 2019-06-07 LAB — HCG, QUANTITATIVE, PREGNANCY: hCG, Beta Chain, Quant, S: 204 m[IU]/mL — ABNORMAL HIGH (ref <5)

## 2019-06-07 LAB — BETA HCG QUANT (REF LAB): hCG Quant: 108 m[IU]/mL

## 2019-06-07 MED ORDER — CLINDAMYCIN HCL 150 MG PO CAPS: 150.0000 mg | ORAL_CAPSULE | Freq: Three times a day (TID) | ORAL | 0 refills | Status: AC

## 2019-06-07 MED ORDER — ACETAMINOPHEN 325 MG PO TABS
650.0000 mg | ORAL_TABLET | Freq: Once | ORAL | Status: AC
  Administered 2019-06-07: 650 mg via ORAL
  Filled 2019-06-07: qty 2
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
## 2019-06-07 NOTE — Progress Notes (Signed)
Patient ID: Haley Lopez, female   DOB: Sep 07, 1995, 24 y.o.   MRN: [id number] Patient seen and assessed by nursing staff during this encounter. I have reviewed the chart and agree with the documentation and plan.  Scheryl Darter, MD 06/07/2019 10:50 AM

## 2019-06-07 NOTE — ED Triage Notes (Signed)
Pt arrives ambulatory to ED with c/o abdominal pain, states that she was seen at ED on Tuesday for abdominal pain and was told that she was about [redacted] weeks pregnant, reports she has two children and did not have this pain with either pregnancy before. Pt reports her abdominal pain has been intermittent. Reports using ibuprofen and heat for pain.  Denies passing any blood.

## 2019-06-07 NOTE — ED Notes (Signed)
ED Provider at bedside. 

## 2019-06-07 NOTE — Discharge Instructions (Addendum)
If any heavy vaginal bleeding or severe abdominal pain please follow-up with the women's clinic.  Take antibiotic as prescribed.  Please take Tylenol 1000 mg up to 4 times a day for any pain.

## 2019-06-08 LAB — GC/CHLAMYDIA PROBE AMP (~~LOC~~) NOT AT ARMC
Chlamydia: NEGATIVE
Neisseria Gonorrhea: NEGATIVE

## 2019-06-13 ENCOUNTER — Ambulatory Visit (HOSPITAL_COMMUNITY): Payer: Medicaid Other

## 2019-06-14 ENCOUNTER — Telehealth (INDEPENDENT_AMBULATORY_CARE_PROVIDER_SITE_OTHER): Payer: Medicaid Other | Admitting: Medical

## 2019-06-14 ENCOUNTER — Other Ambulatory Visit: Payer: Self-pay

## 2019-06-14 ENCOUNTER — Encounter: Payer: Self-pay | Admitting: Medical

## 2019-06-14 ENCOUNTER — Ambulatory Visit (HOSPITAL_COMMUNITY)
Admission: RE | Admit: 2019-06-14 | Discharge: 2019-06-14 | Disposition: A | Discharge status: Home / Self Care | Payer: Medicaid Other | Source: Ambulatory Visit | Attending: Obstetrics and Gynecology | Admitting: Obstetrics and Gynecology

## 2019-06-14 DIAGNOSIS — Z3200 Encounter for pregnancy test, result unknown: Secondary | ICD-10-CM | POA: Insufficient documentation

## 2019-06-14 DIAGNOSIS — Z3A01 Less than 8 weeks gestation of pregnancy: Secondary | ICD-10-CM | POA: Diagnosis not present

## 2019-06-14 DIAGNOSIS — O3680X0 Pregnancy with inconclusive fetal viability, not applicable or unspecified: Secondary | ICD-10-CM

## 2019-06-14 DIAGNOSIS — Z3490 Encounter for supervision of normal pregnancy, unspecified, unspecified trimester: Secondary | ICD-10-CM

## 2019-06-14 NOTE — Patient Instructions (Signed)
First Trimester of Pregnancy  The first trimester of pregnancy is from week 1 until the end of week 13 (months 1 through 3). During this time, your baby will begin to develop inside you. At 6-8 weeks, the eyes and face are formed, and the heartbeat can be seen on ultrasound. At the end of 12 weeks, all the baby's organs are formed. Prenatal care is all the medical care you receive before the birth of your baby. Make sure you get good prenatal care and follow all of your doctor's instructions. Follow these instructions at home: Medicines  Take over-the-counter and prescription medicines only as told by your doctor. Some medicines are safe and some medicines are not safe during pregnancy.  Take a prenatal vitamin that contains at least 600 micrograms (mcg) of folic acid.  If you have trouble pooping (constipation), take medicine that will make your stool soft (stool softener) if your doctor approves. Eating and drinking   Eat regular, healthy meals.  Your doctor will tell you the amount of weight gain that is right for you.  Avoid raw meat and uncooked cheese.  If you feel sick to your stomach (nauseous) or throw up (vomit): ? Eat 4 or 5 small meals a day instead of 3 large meals. ? Try eating a few soda crackers. ? Drink liquids between meals instead of during meals.  To prevent constipation: ? Eat foods that are high in fiber, like fresh fruits and vegetables, whole grains, and beans. ? Drink enough fluids to keep your pee (urine) clear or pale yellow. Activity  Exercise only as told by your doctor. Stop exercising if you have cramps or pain in your lower belly (abdomen) or low back.  Do not exercise if it is too hot, too humid, or if you are in a place of great height (high altitude).  Try to avoid standing for long periods of time. Move your legs often if you must stand in one place for a long time.  Avoid heavy lifting.  Wear low-heeled shoes. Sit and stand up  straight.  You can have sex unless your doctor tells you not to. Relieving pain and discomfort  Wear a good support bra if your breasts are sore.  Take warm water baths (sitz baths) to soothe pain or discomfort caused by hemorrhoids. Use hemorrhoid cream if your doctor says it is okay.  Rest with your legs raised if you have leg cramps or low back pain.  If you have puffy, bulging veins (varicose veins) in your legs: ? Wear support hose or compression stockings as told by your doctor. ? Raise (elevate) your feet for 15 minutes, 3-4 times a day. ? Limit salt in your food. Prenatal care  Schedule your prenatal visits by the twelfth week of pregnancy.  Write down your questions. Take them to your prenatal visits.  Keep all your prenatal visits as told by your doctor. This is important. Safety  Wear your seat belt at all times when driving.  Make a list of emergency phone numbers. The list should include numbers for family, friends, the hospital, and police and fire departments. General instructions  Ask your doctor for a referral to a local prenatal class. Begin classes no later than at the start of month 6 of your pregnancy.  Ask for help if you need counseling or if you need help with nutrition. Your doctor can give you advice or tell you where to go for help.  Do not use hot tubs, steam   rooms, or saunas.  Do not douche or use tampons or scented sanitary pads.  Do not cross your legs for long periods of time.  Avoid all herbs and alcohol. Avoid drugs that are not approved by your doctor.  Do not use any tobacco products, including cigarettes, chewing tobacco, and electronic cigarettes. If you need help quitting, ask your doctor. You may get counseling or other support to help you quit.  Avoid cat litter boxes and soil used by cats. These carry germs that can cause birth defects in the baby and can cause a loss of your baby (miscarriage) or stillbirth.  Visit your dentist.  At home, brush your teeth with a soft toothbrush. Be gentle when you floss. Contact a doctor if:  You are dizzy.  You have mild cramps or pressure in your lower belly.  You have a nagging pain in your belly area.  You continue to feel sick to your stomach, you throw up, or you have watery poop (diarrhea).  You have a bad smelling fluid coming from your vagina.  You have pain when you pee (urinate).  You have increased puffiness (swelling) in your face, hands, legs, or ankles. Get help right away if:  You have a fever.  You are leaking fluid from your vagina.  You have spotting or bleeding from your vagina.  You have very bad belly cramping or pain.  You gain or lose weight rapidly.  You throw up blood. It may look like coffee grounds.  You are around people who have German measles, fifth disease, or chickenpox.  You have a very bad headache.  You have shortness of breath.  You have any kind of trauma, such as from a fall or a car accident. Summary  The first trimester of pregnancy is from week 1 until the end of week 13 (months 1 through 3).  To take care of yourself and your unborn baby, you will need to eat healthy meals, take medicines only if your doctor tells you to do so, and do activities that are safe for you and your baby.  Keep all follow-up visits as told by your doctor. This is important as your doctor will have to ensure that your baby is healthy and growing well. This information is not intended to replace advice given to you by your health care provider. Make sure you discuss any questions you have with your health care provider. Document Revised: 07/19/2018 Document Reviewed: 04/05/2016 Elsevier Patient Education  2020 Elsevier Inc.  

## 2019-06-14 NOTE — Addendum Note (Signed)
Addended by: Marya Landry D on: 06/14/2019 11:39 AM   Modules accepted: Orders

## 2019-06-14 NOTE — Progress Notes (Signed)
I connected with [name] on 06/14/2019 at 9:33 AM AM EDT by: MyChart and verified that I am speaking with the correct person using two identifiers.  Patient is located at home and provider is located at [facility].    The purpose of this virtual visit is to provide medical care while limiting exposure to the novel coronavirus. I discussed the limitations, risks, security and privacy concerns of performing an evaluation and management service by MyChart and the availability of in person appointments. I also discussed with the patient that there may be a patient responsible charge related to this service. By engaging in this virtual visit, you consent to the provision of healthcare.  Additionally, you authorize for your insurance to be billed for the services provided during this visit.  The patient expressed understanding and agreed to proceed.  History:  [name] is a 24 y.o. [id number] has a virtual visit today to discuss [place] results from earlier this morning. The patient was originally seen in [facility] on 06/04/19 with abdominal pain. [place] was too early to visualize IUGS. She was followed at [facility] for 48 hour hCG which showed appropriate rise. She was scheduled today for repeat US.    The following portions of the patient's history were reviewed and updated as appropriate: allergies, current medications, family history, past medical history, social history, past surgical history and problem list.  Review of Systems:  Review of Systems  Constitutional: Negative for fever.  Gastrointestinal: Positive for abdominal pain.  Genitourinary:       Neg - vaginal bleeding      Objective:  Physical Exam LMP 05/10/2019  Physical Exam  Deferred due to visit type    Labs and Imaging US OB Transvaginal  Result Date: 06/14/2019 CLINICAL DATA:  Viability EXAM: TRANSVAGINAL OB ULTRASOUND TECHNIQUE: Transvaginal ultrasound was performed for complete evaluation of the gestation as well as the maternal  uterus, adnexal regions, and pelvic cul-de-sac. COMPARISON:  None. FINDINGS: Intrauterine gestational sac: Single Yolk sac:  Visualized Embryo:  Not visualized Cardiac Activity: Not visualized Heart Rate: Bpm MSD: 7.6 mm   5 w   3 d CRL:     mm    w  d                  [place] EDC: Subchorionic hemorrhage:  None visualized Maternal uterus/adnexae: No adnexal mass.  Trace free fluid. IMPRESSION: Early intrauterine gestational sac. No embryo currently. Estimated gestational age [redacted] weeks 3 day by mean sac diameter. This could be followed with repeat ultrasound in 14 days to ensure expected progression. No acute maternal findings. Electronically Signed   By: Charlett Nose M.D.   On: 06/14/2019 09:02     Assessment & Plan:  1. Intrauterine pregnancy - IUGS and YS seen consistent with early gestation and appropriate progression from last [place] - Patient will be scheduled for repeat US in 10 days for viability prior to scheduled new OB visit  - Patient will follow-up for virtual results visit at [facility] after [place] - First trimester warning signs reviewed - Patient advised to go to [facility] for severe abdominal pain or heavy vaginal bleeding prior to next [place]  Vonzella Nipple, PA-C 06/14/2019 9:37 AM

## 2019-06-28 ENCOUNTER — Encounter: Payer: Self-pay | Admitting: Obstetrics and Gynecology

## 2019-06-28 ENCOUNTER — Ambulatory Visit (HOSPITAL_COMMUNITY)
Admission: RE | Admit: 2019-06-28 | Discharge: 2019-06-28 | Disposition: A | Discharge status: Home / Self Care | Payer: Medicaid Other | Source: Ambulatory Visit | Attending: Medical | Admitting: Medical

## 2019-06-28 ENCOUNTER — Other Ambulatory Visit: Payer: Self-pay

## 2019-06-28 ENCOUNTER — Telehealth (INDEPENDENT_AMBULATORY_CARE_PROVIDER_SITE_OTHER): Payer: Medicaid Other | Admitting: Obstetrics and Gynecology

## 2019-06-28 DIAGNOSIS — Z3490 Encounter for supervision of normal pregnancy, unspecified, unspecified trimester: Secondary | ICD-10-CM | POA: Diagnosis present

## 2019-06-28 DIAGNOSIS — R112 Nausea with vomiting, unspecified: Secondary | ICD-10-CM | POA: Diagnosis not present

## 2019-06-28 DIAGNOSIS — Z3491 Encounter for supervision of normal pregnancy, unspecified, first trimester: Secondary | ICD-10-CM

## 2019-06-28 NOTE — Progress Notes (Signed)
Spoke with pt today regarding u/s results. Pt was made aware of results and dating information.  Pt doing well- denies any bleeding.  Pt states she is having some problems with N&V- advised she may take Unisom/Vit B6 combo.  Pt made aware if no relief to contact office.  Pt was made aware she will be scheduled for a NOB appt in 3-4 weeks. Advised she may call office anytime with questions/concerns.  Pt states understanding and has no other concerns. Pt was not seen by provider today- nurse call only.  Agree with above.  Nettie Elm, MD

## 2019-07-02 ENCOUNTER — Other Ambulatory Visit: Payer: Self-pay

## 2019-07-02 DIAGNOSIS — O219 Vomiting of pregnancy, unspecified: Secondary | ICD-10-CM

## 2019-07-02 NOTE — Progress Notes (Signed)
Ok to send Rx for N&V per provider Pt has a few pharmacies on file  Called pt to confirm pt was not ava  Rx not sent

## 2019-07-03 MED ORDER — PROMETHAZINE HCL 25 MG PO TABS: 25.0000 mg | ORAL_TABLET | Freq: Four times a day (QID) | ORAL | 1 refills | Status: DC | PRN

## 2019-07-08 ENCOUNTER — Telehealth: Payer: Self-pay

## 2019-07-08 NOTE — Telephone Encounter (Signed)
Pt called and LVM on nurse line requesting a call back. Called number back and no answer, LVM for pt to call back.

## 2019-07-23 ENCOUNTER — Ambulatory Visit (INDEPENDENT_AMBULATORY_CARE_PROVIDER_SITE_OTHER): Payer: Medicaid Other

## 2019-07-23 DIAGNOSIS — O0990 Supervision of high risk pregnancy, unspecified, unspecified trimester: Secondary | ICD-10-CM | POA: Insufficient documentation

## 2019-07-23 DIAGNOSIS — Z3480 Encounter for supervision of other normal pregnancy, unspecified trimester: Secondary | ICD-10-CM

## 2019-07-23 MED ORDER — BLOOD PRESSURE KIT: 1.0000 | PACK | Freq: Once | 0 refills | Status: AC

## 2019-07-30 ENCOUNTER — Other Ambulatory Visit: Payer: Self-pay

## 2019-07-30 ENCOUNTER — Encounter: Payer: Self-pay | Admitting: Nurse Practitioner

## 2019-07-30 ENCOUNTER — Other Ambulatory Visit (HOSPITAL_COMMUNITY)
Admission: RE | Admit: 2019-07-30 | Discharge: 2019-07-30 | Disposition: A | Discharge status: Home / Self Care | Payer: Medicaid Other | Source: Ambulatory Visit | Attending: Nurse Practitioner | Admitting: Nurse Practitioner

## 2019-07-30 ENCOUNTER — Ambulatory Visit (INDEPENDENT_AMBULATORY_CARE_PROVIDER_SITE_OTHER): Payer: Medicaid Other | Admitting: Nurse Practitioner

## 2019-07-30 VITALS — BP 118/72 | HR 78 | Wt 103.0 lb

## 2019-07-30 DIAGNOSIS — Z8759 Personal history of other complications of pregnancy, childbirth and the puerperium: Secondary | ICD-10-CM

## 2019-07-30 DIAGNOSIS — B9689 Other specified bacterial agents as the cause of diseases classified elsewhere: Secondary | ICD-10-CM

## 2019-07-30 DIAGNOSIS — Z3481 Encounter for supervision of other normal pregnancy, first trimester: Secondary | ICD-10-CM

## 2019-07-30 DIAGNOSIS — O210 Mild hyperemesis gravidarum: Secondary | ICD-10-CM

## 2019-07-30 DIAGNOSIS — Z8659 Personal history of other mental and behavioral disorders: Secondary | ICD-10-CM | POA: Insufficient documentation

## 2019-07-30 DIAGNOSIS — Z3A11 11 weeks gestation of pregnancy: Secondary | ICD-10-CM

## 2019-07-30 DIAGNOSIS — N760 Acute vaginitis: Secondary | ICD-10-CM

## 2019-07-30 DIAGNOSIS — Z3480 Encounter for supervision of other normal pregnancy, unspecified trimester: Secondary | ICD-10-CM

## 2019-07-30 DIAGNOSIS — Z8751 Personal history of pre-term labor: Secondary | ICD-10-CM

## 2019-07-30 MED ORDER — VITAFOL GUMMIES 3.33-0.333-34.8 MG PO CHEW: 1.0000 | CHEWABLE_TABLET | Freq: Every day | ORAL | 5 refills | Status: DC

## 2019-07-30 MED ORDER — DOXYLAMINE-PYRIDOXINE 10-10 MG PO TBEC: DELAYED_RELEASE_TABLET | ORAL | 2 refills | Status: DC

## 2019-07-30 NOTE — Patient Instructions (Signed)
Morning Sickness ° °Morning sickness is when you feel sick to your stomach (nauseous) during pregnancy. You may feel sick to your stomach and throw up (vomit). You may feel sick in the morning, but you can feel this way at any time of day. Some women feel very sick to their stomach and cannot stop throwing up (hyperemesis gravidarum). °Follow these instructions at home: °Medicines °· Take over-the-counter and prescription medicines only as told by your doctor. Do not take any medicines until you talk with your doctor about them first. °· Taking multivitamins before getting pregnant can stop or lessen the harshness of morning sickness. °Eating and drinking °· Eat dry toast or crackers before getting out of bed. °· Eat 5 or 6 small meals a day. °· Eat dry and bland foods like rice and baked potatoes. °· Do not eat greasy, fatty, or spicy foods. °· Have someone cook for you if the smell of food causes you to feel sick or throw up. °· If you feel sick to your stomach after taking prenatal vitamins, take them at night or with a snack. °· Eat protein when you need a snack. Nuts, yogurt, and cheese are good choices. °· Drink fluids throughout the day. °· Try ginger ale made with real ginger, ginger tea made from fresh grated ginger, or ginger candies. °General instructions °· Do not use any products that have nicotine or tobacco in them, such as cigarettes and e-cigarettes. If you need help quitting, ask your doctor. °· Use an air purifier to keep the air in your house free of smells. °· Get lots of fresh air. °· Try to avoid smells that make you feel sick. °· Try: °? Wearing a bracelet that is used for seasickness (acupressure wristband). °? Going to a doctor who puts thin needles into certain body points (acupuncture) to improve how you feel. °Contact a doctor if: °· You need medicine to feel better. °· You feel dizzy or light-headed. °· You are losing weight. °Get help right away if: °· You feel very sick to your  stomach and cannot stop throwing up. °· You pass out (faint). °· You have very bad pain in your belly. °Summary °· Morning sickness is when you feel sick to your stomach (nauseous) during pregnancy. °· You may feel sick in the morning, but you can feel this way at any time of day. °· Making some changes to what you eat may help your symptoms go away. °This information is not intended to replace advice given to you by your health care provider. Make sure you discuss any questions you have with your health care provider. °Document Revised: 03/10/2017 Document Reviewed: 04/28/2016 °Elsevier Patient Education © 2020 Elsevier Inc. ° °

## 2019-07-30 NOTE — Progress Notes (Signed)
Pt presents for NOB visit c/o morning sickness.
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
## 2019-07-31 LAB — CERVICOVAGINAL ANCILLARY ONLY
Bacterial Vaginitis (gardnerella): POSITIVE — AB
Candida Glabrata: NEGATIVE
Candida Vaginitis: NEGATIVE
Chlamydia: NEGATIVE
Comment: NEGATIVE
Comment: NEGATIVE
Comment: NEGATIVE
Comment: NEGATIVE
Comment: NEGATIVE
Comment: NORMAL
Neisseria Gonorrhea: NEGATIVE
Trichomonas: NEGATIVE

## 2019-07-31 LAB — OBSTETRIC PANEL, INCLUDING HIV
Antibody Screen: NEGATIVE
Basophils Absolute: 0 10*3/uL (ref 0.0–0.2)
Basos: 0 %
EOS (ABSOLUTE): 0 10*3/uL (ref 0.0–0.4)
Eos: 1 %
HIV Screen 4th Generation wRfx: NONREACTIVE
Hematocrit: 35.3 % (ref 34.0–46.6)
Hemoglobin: 11.4 g/dL (ref 11.1–15.9)
Hepatitis B Surface Ag: NEGATIVE
Immature Grans (Abs): 0 10*3/uL (ref 0.0–0.1)
Immature Granulocytes: 0 %
Lymphocytes Absolute: 1.6 10*3/uL (ref 0.7–3.1)
Lymphs: 32 %
MCH: 27 pg (ref 26.6–33.0)
MCHC: 32.3 g/dL (ref 31.5–35.7)
MCV: 84 fL (ref 79–97)
Monocytes Absolute: 0.4 10*3/uL (ref 0.1–0.9)
Monocytes: 8 %
Neutrophils Absolute: 2.9 10*3/uL (ref 1.4–7.0)
Neutrophils: 59 %
Platelets: 274 10*3/uL (ref 150–450)
RBC: 4.23 x10E6/uL (ref 3.77–5.28)
RDW: 13.4 % (ref 11.7–15.4)
RPR Ser Ql: NONREACTIVE
Rh Factor: POSITIVE
Rubella Antibodies, IGG: 2.97 index (ref >0.99)
WBC: 5 10*3/uL (ref 3.4–10.8)

## 2019-07-31 LAB — HEPATITIS C ANTIBODY: Hep C Virus Ab: 0.2 s/co ratio (ref 0.0–0.9)

## 2019-07-31 MED ORDER — METRONIDAZOLE 0.75 % VA GEL: 1.0000 | Freq: Every day | VAGINAL | 0 refills | Status: AC

## 2019-07-31 NOTE — Addendum Note (Signed)
Addended by: Currie Paris on: 07/31/2019 04:34 PM   Modules accepted: Orders

## 2019-08-01 LAB — CYTOLOGY - PAP
Comment: NEGATIVE
Diagnosis: NEGATIVE
High risk HPV: NEGATIVE

## 2019-08-01 LAB — CULTURE, OB URINE

## 2019-08-01 LAB — URINE CULTURE, OB REFLEX: Organism ID, Bacteria: NO GROWTH

## 2019-08-05 ENCOUNTER — Encounter: Payer: Self-pay | Admitting: Nurse Practitioner

## 2019-08-06 ENCOUNTER — Encounter: Payer: Medicaid Other | Admitting: Licensed Clinical Social Worker

## 2019-08-08 ENCOUNTER — Other Ambulatory Visit: Payer: Self-pay

## 2019-08-08 DIAGNOSIS — O210 Mild hyperemesis gravidarum: Secondary | ICD-10-CM

## 2019-08-08 MED ORDER — DOXYLAMINE-PYRIDOXINE 10-10 MG PO TBEC: DELAYED_RELEASE_TABLET | ORAL | 2 refills | Status: DC

## 2019-08-09 ENCOUNTER — Encounter: Payer: Self-pay | Admitting: Nurse Practitioner

## 2019-08-09 DIAGNOSIS — D563 Thalassemia minor: Secondary | ICD-10-CM | POA: Insufficient documentation

## 2019-08-12 ENCOUNTER — Ambulatory Visit (INDEPENDENT_AMBULATORY_CARE_PROVIDER_SITE_OTHER): Payer: Medicaid Other | Admitting: Licensed Clinical Social Worker

## 2019-08-12 DIAGNOSIS — Z3A00 Weeks of gestation of pregnancy not specified: Secondary | ICD-10-CM | POA: Diagnosis not present

## 2019-08-12 DIAGNOSIS — O99340 Other mental disorders complicating pregnancy, unspecified trimester: Secondary | ICD-10-CM | POA: Diagnosis not present

## 2019-08-12 DIAGNOSIS — F329 Major depressive disorder, single episode, unspecified: Secondary | ICD-10-CM

## 2019-08-12 DIAGNOSIS — F32A Depression, unspecified: Secondary | ICD-10-CM

## 2019-08-12 NOTE — BH Specialist Note (Signed)
Integrated Behavioral Health via Telemedicine Video Visit  08/12/2019 Haley Lopez [id number]  Number of Integrated Behavioral Health visits: 1 Session Start time: 2:12pm  Session End time: 2:40 Total time: 28 mins  Referring Provider: Lilyan Punt NP Type of Visit: Video Patient/Family location: Home  [facility] Provider location: [facility]  All persons participating in visit: Pt and LCSWA A. Shawn Dannenberg  Confirmed patient's address:  Confirmed patient's phone number:  Any changes to demographics:   Confirmed patient's insurance: no  Any changes to patient's insurance: no  Discussed confidentiality: yes I connected with Haley Lopez and/or Haley Lopez's  by a video enabled telemedicine application and verified that I am speaking with the correct person using two identifiers.     I discussed the limitations of evaluation and management by telemedicine and the availability of in person appointments.  I discussed that the purpose of this visit is to provide behavioral health care while limiting exposure to the novel coronavirus.   Discussed there is a possibility of technology failure and discussed alternative modes of communication if that failure occurs.  I discussed that engaging in this video visit, they consent to the provision of behavioral healthcare and the services will be billed under their insurance.  Patient and/or legal guardian expressed understanding and consented to video visit: yes  PRESENTING CONCERNS: Patient and/or family reports the following symptoms/concerns: depression  Duration of problem: since beginning of pregnancy; Severity of problem: mild   STRENGTHS (Protective Factors/Coping Skills):   GOALS ADDRESSED: Patient will: 1.  Reduce symptoms of:  Depression, anxiety  2.  Increase knowledge and/or ability of:  n/a 3.  Demonstrate ability to: self manage symptoms   INTERVENTIONS: Interventions utilized:  Supportive counseling Standardized Assessments completed:  n/a  ASSESSMENT: Patient currently experiencing depression affecting pregnancy   Patient may benefit from integrated behavioral health   PLAN: 1. Follow up with behavioral health clinician on :4 weeks 2. Behavioral recommendations: mindfulness techniques 3. Referral(s):   I discussed the assessment and treatment plan with the patient and/or parent/guardian. They were provided an opportunity to ask questions and all were answered. They agreed with the plan and demonstrated an understanding of the instructions.   They were advised to call back or seek an in-person evaluation if the symptoms worsen or if the condition fails to improve as anticipated.  Haley Lopez

## 2019-08-27 ENCOUNTER — Encounter: Payer: Self-pay | Admitting: Nurse Practitioner

## 2019-08-27 ENCOUNTER — Telehealth (INDEPENDENT_AMBULATORY_CARE_PROVIDER_SITE_OTHER): Payer: Medicaid Other | Admitting: Nurse Practitioner

## 2019-08-27 VITALS — BP 110/67 | HR 87

## 2019-08-27 DIAGNOSIS — O09212 Supervision of pregnancy with history of pre-term labor, second trimester: Secondary | ICD-10-CM

## 2019-08-27 DIAGNOSIS — Z3480 Encounter for supervision of other normal pregnancy, unspecified trimester: Secondary | ICD-10-CM

## 2019-08-27 DIAGNOSIS — O288 Other abnormal findings on antenatal screening of mother: Secondary | ICD-10-CM

## 2019-08-27 DIAGNOSIS — D563 Thalassemia minor: Secondary | ICD-10-CM

## 2019-08-27 DIAGNOSIS — O210 Mild hyperemesis gravidarum: Secondary | ICD-10-CM

## 2019-08-27 DIAGNOSIS — Z3A15 15 weeks gestation of pregnancy: Secondary | ICD-10-CM

## 2019-08-27 DIAGNOSIS — O2242 Hemorrhoids in pregnancy, second trimester: Secondary | ICD-10-CM

## 2019-08-27 MED ORDER — DOCUSATE CALCIUM 240 MG PO CAPS: 240.0000 mg | ORAL_CAPSULE | Freq: Every day | ORAL | 2 refills | Status: DC

## 2019-08-27 MED ORDER — DOXYLAMINE-PYRIDOXINE 10-10 MG PO TBEC: DELAYED_RELEASE_TABLET | ORAL | 2 refills | Status: DC

## 2019-08-27 MED ORDER — HYDROCORTISONE (PERIANAL) 2.5 % EX CREA: 1.0000 "application " | TOPICAL_CREAM | Freq: Four times a day (QID) | CUTANEOUS | 0 refills | Status: AC
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
## 2019-08-27 NOTE — Progress Notes (Signed)
Pt states she needs refill on Diclegis.  Pt has hemorrhoids and using OTC products.  Pt had abnormal [place] screen and has questions - made her aware of carrier result. Pt will need appt to start on 17p.

## 2019-08-28 ENCOUNTER — Encounter: Payer: Self-pay | Admitting: Obstetrics

## 2019-08-30 ENCOUNTER — Telehealth: Payer: Self-pay

## 2019-08-30 NOTE — Telephone Encounter (Signed)
Returned call and pt stated that rx for hemorrhoids caused her to be sore, advised of otc treatments and advised to call back if they do not work, pt agreed.

## 2019-09-03 ENCOUNTER — Ambulatory Visit (INDEPENDENT_AMBULATORY_CARE_PROVIDER_SITE_OTHER): Payer: Medicaid Other

## 2019-09-03 ENCOUNTER — Ambulatory Visit: Payer: Medicaid Other

## 2019-09-03 ENCOUNTER — Other Ambulatory Visit: Payer: Self-pay

## 2019-09-03 VITALS — BP 120/72 | HR 72 | Ht 61.0 in | Wt 106.6 lb

## 2019-09-03 DIAGNOSIS — Z3A16 16 weeks gestation of pregnancy: Secondary | ICD-10-CM | POA: Diagnosis not present

## 2019-09-03 DIAGNOSIS — Z3480 Encounter for supervision of other normal pregnancy, unspecified trimester: Secondary | ICD-10-CM | POA: Diagnosis not present

## 2019-09-03 MED ORDER — HYDROXYPROGESTERONE CAPROATE 275 MG/1.1ML ~~LOC~~ SOAJ
275.0000 mg | Freq: Once | SUBCUTANEOUS | Status: AC
  Administered 2019-09-03: 275 mg via SUBCUTANEOUS

## 2019-09-03 NOTE — Progress Notes (Signed)
This pt didn't return call to office to begin her virtual intake. -EH/RMA

## 2019-09-03 NOTE — Progress Notes (Signed)
SUBJECTIVE [redacted]w[redacted]d OB presents for 17P injection, given LA, tolerated well.   PLAN Next Injection in 1 week June 04/2019.

## 2019-09-05 LAB — AFP, SERUM, OPEN SPINA BIFIDA
AFP MoM: 0.95
AFP Value: 48.2 ng/mL
Gest. Age on Collection Date: 16.4 weeks
Maternal Age At EDD: 23.4 yr
OSBR Risk 1 IN: 10000
Test Results:: NEGATIVE
Weight: 106 [lb_av]

## 2019-09-07 IMAGING — US US MFM OB DETAIL +14 WK
1 series · 13 of 28 positions shown · non-contrast
Comparison: none

[Series 1: us mfm ob detail +14 wk · 60 acquisitions, 13 frames shown]
[im 3/60]
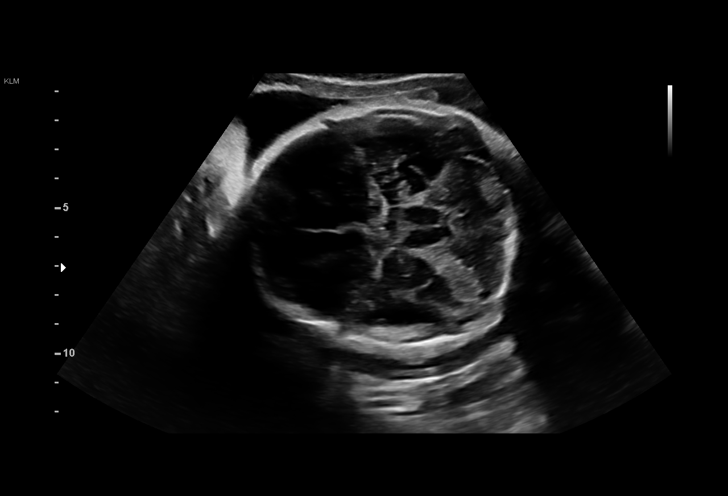
[im 7/60]
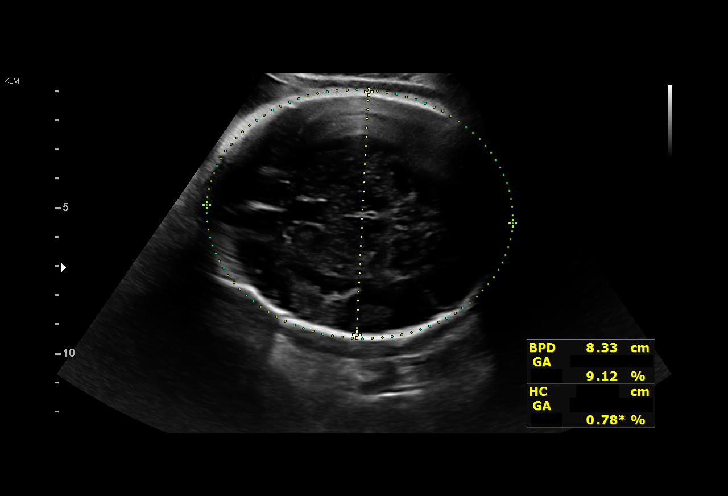
[im 11/60]
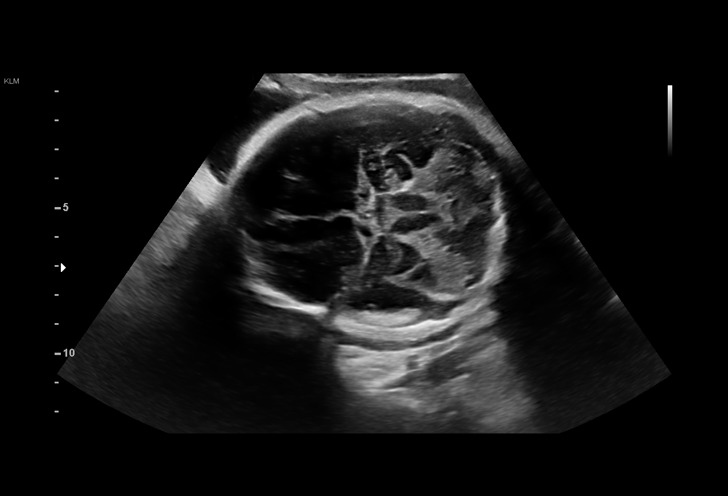
[im 16/60]
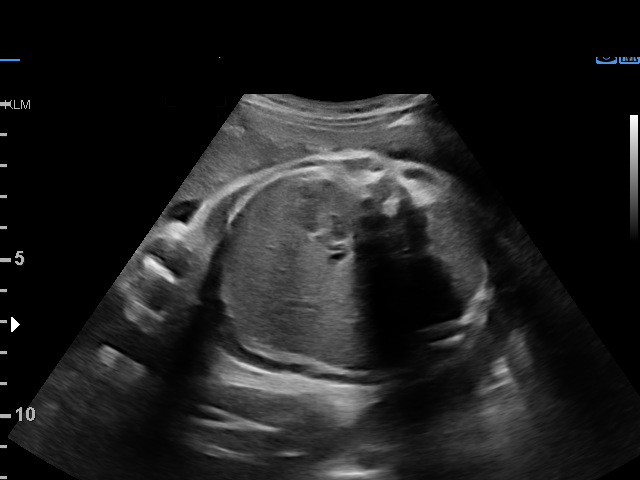
[im 20/60]
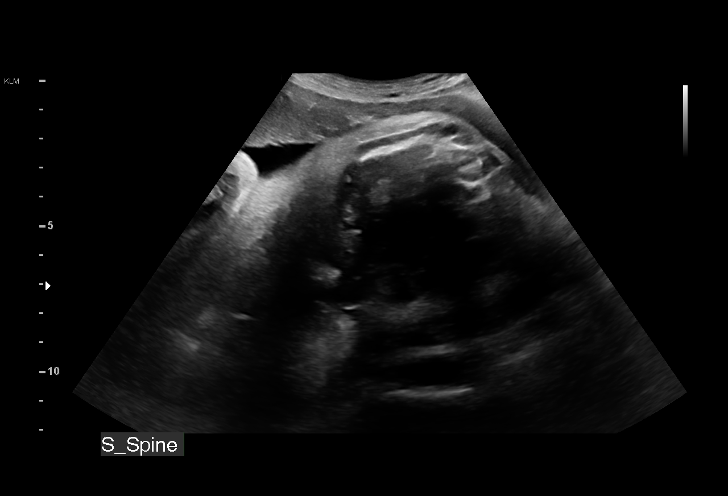
[im 25/60]
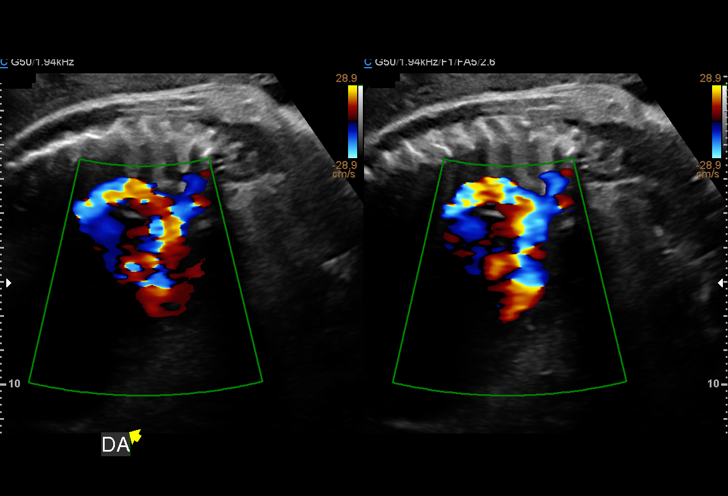
[im 31/60]
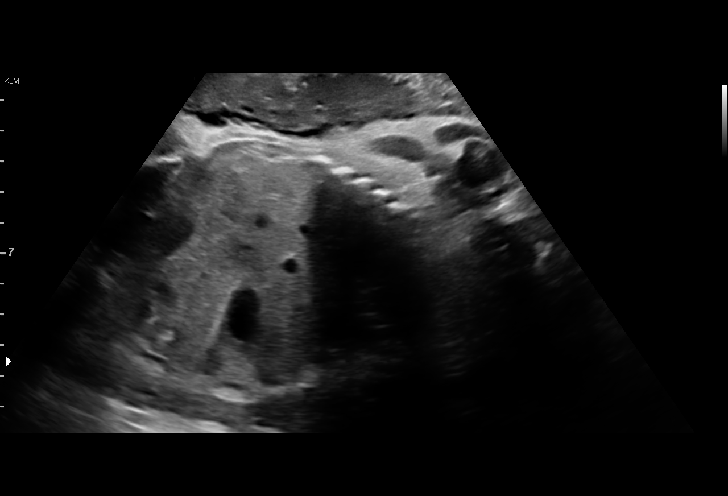
[im 35/60]
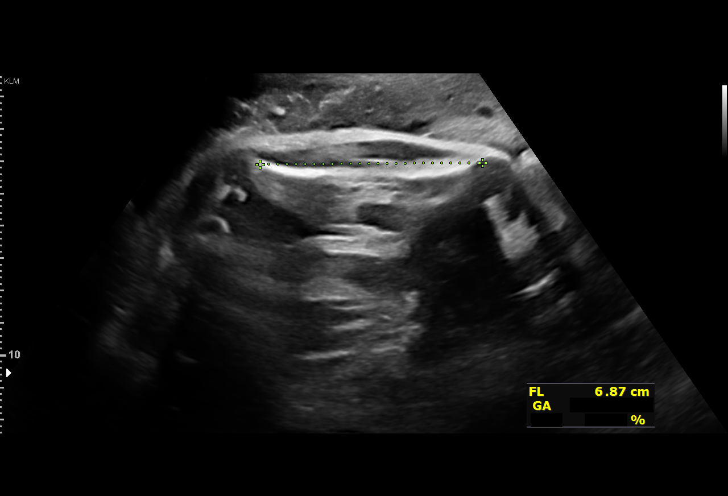
[im 40/60]
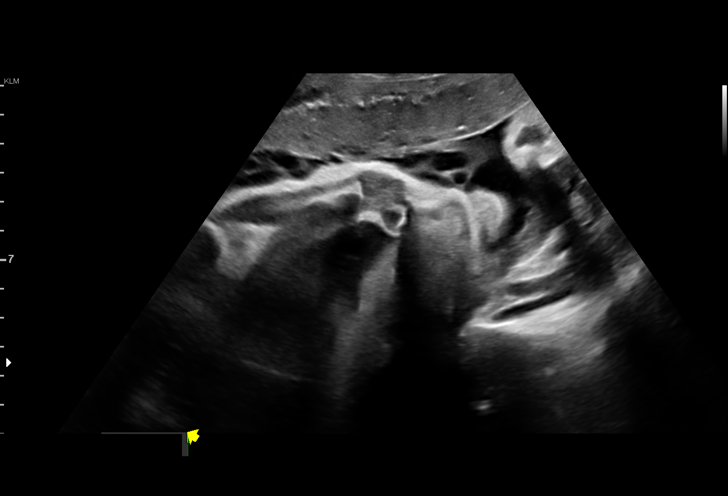
[im 44/60]
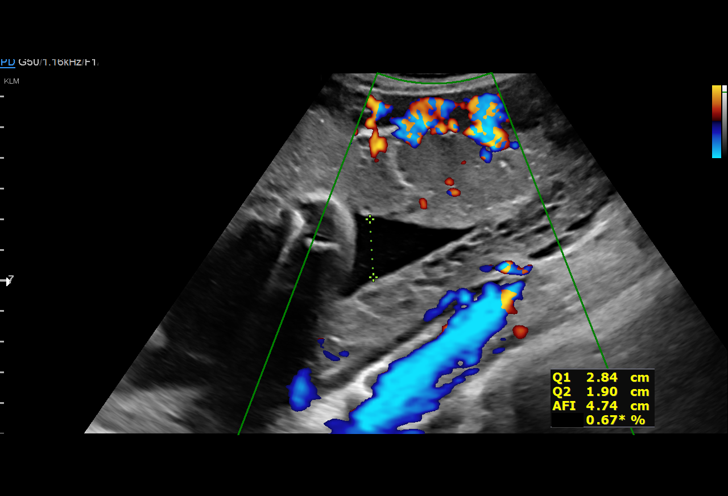
[im 49/60]
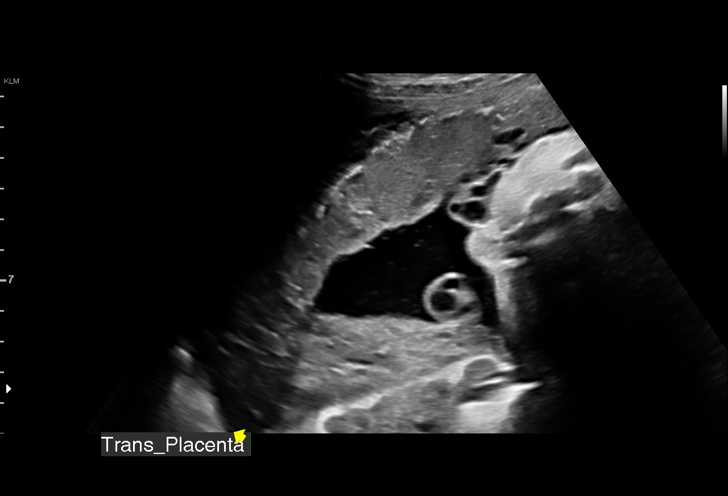
[im 53/60]
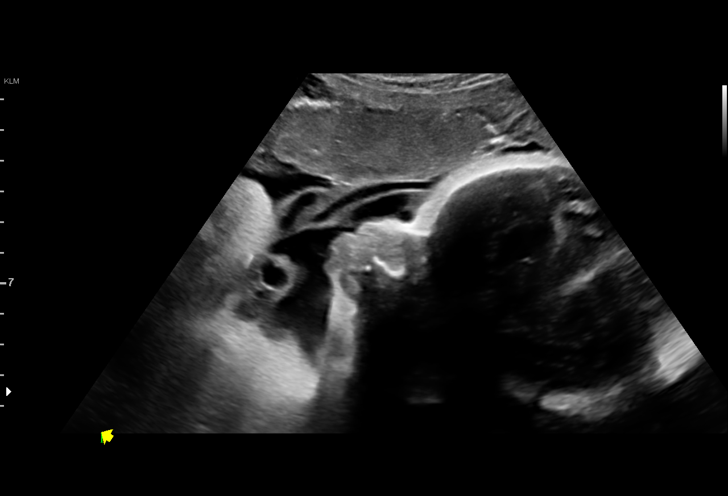
[im 57/60]
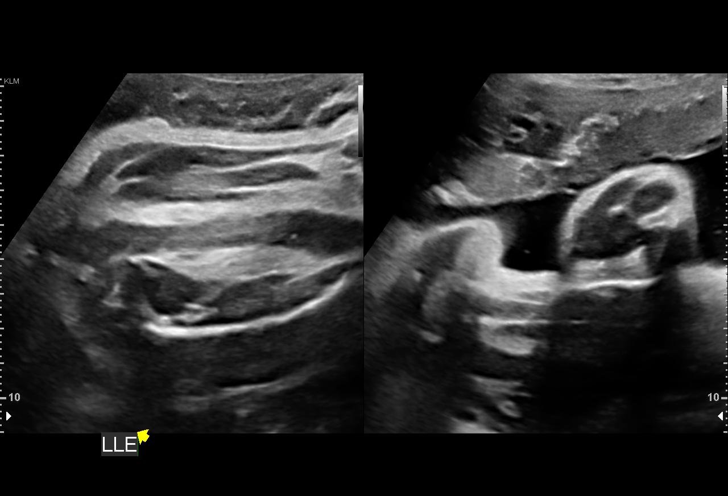

[13 of 28 positions shown; findings below may reference images not displayed]

----------------------------------------------------------------------

 ----------------------------------------------------------------------
Indications

  Size-Date Discrepancy
  35 weeks gestation of pregnancy
  Maternal care for known or suspected poor
  fetal growth, third trimester, fetus 1
  Poor obstetric history: Previous preterm
  delivery, antepartum (36 weeks, 17p)
  Encounter for antenatal screening for
  malformations (pt transferred care from
  [HOSPITAL][HOSPITAL])
 ----------------------------------------------------------------------
Fetal Evaluation

 Num Of Fetuses:         1
 Fetal Heart Rate(bpm):  133
 Cardiac Activity:       Observed
 Presentation:           Cephalic
 Placenta:               Anterior
 P. Cord Insertion:      Visualized

 Amniotic Fluid
 AFI FV:      Within normal limits

 AFI Sum(cm)     %Tile       Largest Pocket(cm)
 12.17           37

 RUQ(cm)       RLQ(cm)       LUQ(cm)        LLQ(cm)

Biometry

 BPD:      82.7  mm     G. Age:  33w 2d          6  %    CI:        73.21   %    70 - 86
                                                         FL/HC:      21.1   %    20.1 -
 HC:      307.2  mm     G. Age:  34w 2d          5  %    HC/AC:      1.05        0.93 -
 AC:      293.3  mm     G. Age:  33w 2d          9  %    FL/BPD:     78.5   %    71 - 87
 FL:       64.9  mm     G. Age:  33w 3d          7  %    FL/AC:      22.1   %    20 - 24
 HUM:        61  mm     G. Age:  35w 2d         63  %

 Est. FW:    0000  gm    4 lb 14 oz      21  %
OB History

 Gravidity:    5         Term:   0        Prem:   1        SAB:   3
 TOP:          0       Ectopic:  0        Living: 1
Gestational Age

 LMP:           35w 3d        Date:  12/19/17                 EDD:   09/25/18
 U/S Today:     33w 4d                                        EDD:   10/08/18
 Best:          35w 3d     Det. By:  LMP  (12/19/17)          EDD:   09/25/18
Anatomy

 Cranium:               Appears normal         Aortic Arch:            Appears normal
 Cavum:                 Appears normal         Ductal Arch:            Appears normal
 Ventricles:            Appears normal         Diaphragm:              Appears normal
 Choroid Plexus:        Appears normal         Stomach:                Appears normal, left
                                                                       sided
 Cerebellum:            Appears normal         Abdomen:                Appears normal
 Posterior Fossa:       Appears normal         Abdominal Wall:         Not well visualized
 Nuchal Fold:           Not applicable (>20    Cord Vessels:           Appears normal (3
                        wks GA)                                        vessel cord)
 Face:                  Orbits nl; profile not Kidneys:                Appear normal
                        well visualized
 Lips:                  Appears normal         Bladder:                Appears normal
 Thoracic:              Appears normal         Spine:                  Appears normal
 Heart:                 Not well visualized    Upper Extremities:      Visualized
 RVOT:                  Appears normal         Lower Extremities:      Visualized
 LVOT:                  Appears normal

 Other:  Technically difficult due to advanced gestational age.
Cervix Uterus Adnexa

 Cervix
 Not visualized (advanced GA >03wks)
Impression

 Amniotic fluid is normal and good fetal activity is seen. Fetal
 growth (EFW) is appropriate for gestational age. However,
 the abdominal circumference measurement is at less than
 the 10th percentile. Fetal anatomical survey is limited
 because of advanced gestational age.
Recommendations

 Follow-up growth scan in 3 weeks.
                      Locklear, Savio

## 2019-09-09 ENCOUNTER — Other Ambulatory Visit: Payer: Self-pay

## 2019-09-09 ENCOUNTER — Encounter (HOSPITAL_COMMUNITY): Payer: Self-pay | Admitting: Obstetrics and Gynecology

## 2019-09-09 ENCOUNTER — Inpatient Hospital Stay (HOSPITAL_BASED_OUTPATIENT_CLINIC_OR_DEPARTMENT_OTHER): Payer: Medicaid Other

## 2019-09-09 ENCOUNTER — Inpatient Hospital Stay (HOSPITAL_COMMUNITY)
Admission: AD | Admit: 2019-09-09 | Discharge: 2019-09-09 | Disposition: A | Discharge status: Home / Self Care | Payer: Medicaid Other | Attending: Obstetrics and Gynecology | Admitting: Obstetrics and Gynecology

## 2019-09-09 DIAGNOSIS — Z3A17 17 weeks gestation of pregnancy: Secondary | ICD-10-CM

## 2019-09-09 DIAGNOSIS — R109 Unspecified abdominal pain: Secondary | ICD-10-CM | POA: Diagnosis present

## 2019-09-09 DIAGNOSIS — W1809XA Striking against other object with subsequent fall, initial encounter: Secondary | ICD-10-CM | POA: Diagnosis not present

## 2019-09-09 DIAGNOSIS — T1490XA Injury, unspecified, initial encounter: Secondary | ICD-10-CM | POA: Diagnosis not present

## 2019-09-09 DIAGNOSIS — W010XXA Fall on same level from slipping, tripping and stumbling without subsequent striking against object, initial encounter: Secondary | ICD-10-CM | POA: Diagnosis not present

## 2019-09-09 DIAGNOSIS — O9A212 Injury, poisoning and certain other consequences of external causes complicating pregnancy, second trimester: Secondary | ICD-10-CM

## 2019-09-09 DIAGNOSIS — Z87891 Personal history of nicotine dependence: Secondary | ICD-10-CM | POA: Insufficient documentation

## 2019-09-09 DIAGNOSIS — Z91040 Latex allergy status: Secondary | ICD-10-CM | POA: Diagnosis not present

## 2019-09-09 DIAGNOSIS — S3991XA Unspecified injury of abdomen, initial encounter: Secondary | ICD-10-CM

## 2019-09-09 DIAGNOSIS — W08XXXA Fall from other furniture, initial encounter: Secondary | ICD-10-CM

## 2019-09-09 LAB — URINALYSIS, ROUTINE W REFLEX MICROSCOPIC
Bilirubin Urine: NEGATIVE
Glucose, UA: NEGATIVE mg/dL
Hgb urine dipstick: NEGATIVE
Ketones, ur: NEGATIVE mg/dL
Leukocytes,Ua: NEGATIVE
Nitrite: NEGATIVE
Protein, ur: NEGATIVE mg/dL
Specific Gravity, Urine: 1.004 — ABNORMAL LOW (ref 1.005–1.030)
pH: 7 (ref 5.0–8.0)

## 2019-09-09 MED ORDER — CYCLOBENZAPRINE HCL 5 MG PO TABS
10.0000 mg | ORAL_TABLET | Freq: Once | ORAL | Status: AC
  Administered 2019-09-09: 10 mg via ORAL
  Filled 2019-09-09: qty 2

## 2019-09-09 NOTE — Discharge Instructions (Signed)

## 2019-09-09 NOTE — MAU Provider Note (Signed)
History     CSN: [id number]  Arrival date and time: 09/09/19 0117   First Provider Initiated Contact with Patient 09/09/19 0205      Chief Complaint  Patient presents with  . Fall   Haley Lopez is a 24 y.o. [id number] at [redacted]w[redacted]d who receives care at [facility].  She presents today for Fall.  She states she tripped over a speaker and fell flat on her stomach.  She states she has been having a "crampy and soreness pain" that has been present since the fall.  Patient reports she fell around midnight. She states she was perceiving movement prior to the fall, but has not felt any since.  She denies vaginal bleeding or watery discharge.       OB History    Gravida  6   Para  2   Term  1   Preterm  1   AB  3   Living  2     SAB  3   TAB  0   Ectopic  0   Multiple  0   Live Births  2           Past Medical History:  Diagnosis Date  . Anemia   . Anxiety   . Constipation   . Depression   . Herpes   . Vision abnormalities     Past Surgical History:  Procedure Laterality Date  . DILATION AND EVACUATION N/A 04/21/2016   Procedure: DILATATION AND EVACUATION;  Surgeon: Lavina Hamman, MD;  Location: [facility];  Service: Gynecology;  Laterality: N/A;  . WISDOM TOOTH EXTRACTION      Family History  Problem Relation Age of Onset  . Depression Mother     Social History   Tobacco Use  . Smoking status: Former Smoker    Types: Cigars  . Smokeless tobacco: Never Used  . Tobacco comment: with marijuana  Substance Use Topics  . Alcohol use: Not Currently    Comment: social  . Drug use: Yes    Frequency: 14.0 times per week    Types: Marijuana    Allergies:  Allergies  Allergen Reactions  . Latex     Rash with condoms    Medications Prior to Admission  Medication Sig Dispense Refill Last Dose  . Doxylamine-Pyridoxine (DICLEGIS) 10-10 MG TBEC Take 2 tablets at bedtime and one in the morning and one in the afternoon as needed for nausea. 60 tablet 2 09/08/2019  at Unknown time  . Prenatal Vit-Fe Phos-FA-Omega (VITAFOL GUMMIES) 3.33-0.333-34.8 MG CHEW Chew 1 tablet by mouth daily. 90 tablet 5 Past Week at Unknown time  . docusate calcium (SURFAK) 240 MG capsule Take 1 capsule (240 mg total) by mouth daily. 30 capsule 2   . promethazine (PHENERGAN) 25 MG tablet Take 1 tablet (25 mg total) by mouth every 6 (six) hours as needed for nausea or vomiting. 30 tablet 1     Review of Systems  Constitutional: Negative for chills and fever.  Respiratory: Negative for cough and shortness of breath.   Gastrointestinal: Positive for abdominal pain. Negative for constipation, diarrhea, nausea and vomiting.  Genitourinary: Negative for difficulty urinating, dysuria, pelvic pain, vaginal bleeding and vaginal discharge.  Musculoskeletal: Positive for back pain (Sides).  Neurological: Positive for headaches (6-7/10). Negative for dizziness and light-headedness.   Physical Exam   Blood pressure (!) 108/55, pulse 74, temperature 98.9 F (37.2 C), temperature source Oral, resp. rate 18, height 5\' 1"  (1.549 m), weight 48.7 kg, last menstrual  period 05/10/2019, SpO2 100 %, not currently breastfeeding.  Physical Exam  Constitutional: She is oriented to person, place, and time. She appears well-developed and well-nourished.  HENT:  Head: Normocephalic and atraumatic.  Eyes: Conjunctivae are normal.  Cardiovascular: Normal rate, regular rhythm and normal heart sounds.  Respiratory: Effort normal and breath sounds normal.  GI: Soft. Bowel sounds are normal. There is abdominal tenderness in the right lower quadrant, periumbilical area and suprapubic area.  Gravid--fundal height appears LGA, Soft, NT   Musculoskeletal:        General: Normal range of motion.     Cervical back: Normal range of motion.  Neurological: She is alert and oriented to person, place, and time.  Skin: Skin is warm and dry.  Psychiatric: She has a normal mood and affect. Her behavior is normal.     MAU Course  Procedures      MDM Physical Exam Pain medication Ultrasound Assessment and Plan  24 year old, [id number]  SIUP at 17.3weeks S/P Fall Abdominal Trauma s/t Fall.  -Reviewed POC with patient. -Exam performed and findings discussed.  -Patient offered and accepts pain medication. -Will give Flexeril 10mg  now -Educated on falls and what to expect including tenderness/soreness for the next few days, possible bruising, and abdominal cramping. -Precautions given.  -Patient requests OOW excuse for tomorrow. Provider agreeable.  -Will send for limited US and await results.  Fraser Din Emly 09/09/2019, 2:05 AM   Reassessment (3:05 AM) -[place] returns WNL -Nurse instructed to: *Reiterate precautions. *Give OOW excuse. *Okay to take tylenol for pain and use heating pads as appropriate.  *Encourage patient to call or return to MAU if symptoms worsen or with the onset of new symptoms. -Patient to follow up as scheduled. -Discharged to home in stable condition.  Maryann Conners MSN, CNM Advanced Practice Provider, Center for [employer]

## 2019-09-09 NOTE — MAU Note (Signed)
Pt reports that about an hour she was in child's room changing tv station and tripped over a speak. Reports that she landed directly on her abdomen. Pt denies vaginal bleeding or LOF, but reports a lot of pain in stomach. Rates 8/10. Pain is constant and crampy in nature.

## 2019-09-10 ENCOUNTER — Ambulatory Visit (INDEPENDENT_AMBULATORY_CARE_PROVIDER_SITE_OTHER): Payer: Medicaid Other | Admitting: Licensed Clinical Social Worker

## 2019-09-10 ENCOUNTER — Ambulatory Visit (INDEPENDENT_AMBULATORY_CARE_PROVIDER_SITE_OTHER): Payer: Medicaid Other

## 2019-09-10 DIAGNOSIS — Z3A17 17 weeks gestation of pregnancy: Secondary | ICD-10-CM

## 2019-09-10 DIAGNOSIS — F419 Anxiety disorder, unspecified: Secondary | ICD-10-CM | POA: Diagnosis not present

## 2019-09-10 DIAGNOSIS — O99340 Other mental disorders complicating pregnancy, unspecified trimester: Secondary | ICD-10-CM | POA: Diagnosis not present

## 2019-09-10 DIAGNOSIS — O09211 Supervision of pregnancy with history of pre-term labor, first trimester: Secondary | ICD-10-CM | POA: Diagnosis not present

## 2019-09-10 DIAGNOSIS — Z8751 Personal history of pre-term labor: Secondary | ICD-10-CM

## 2019-09-10 MED ORDER — HYDROXYPROGESTERONE CAPROATE 275 MG/1.1ML ~~LOC~~ SOAJ
275.0000 mg | SUBCUTANEOUS | Status: DC
  Administered 2019-09-10 – 2020-01-07 (×11): 275 mg via SUBCUTANEOUS

## 2019-09-10 NOTE — Progress Notes (Signed)
Pt is in the office for 17p injection, administered in R arm and pt tolerated well. .. Administrations This Visit    HYDROXYprogesterone caproate (Makena) autoinjector 275 mg    Admin Date 09/10/2019 Action Given Dose 275 mg Route Subcutaneous Administered By [name], RN

## 2019-09-10 NOTE — BH Specialist Note (Signed)
Integrated Behavioral Health Follow Up Visit  MRN: [id number] Name: Haley Lopez  Number of Integrated Behavioral Health Clinician visits: 2 Session Start time: 1:45pm  Session End time: 2:23pm Total time: 38 mins face to face @ [facility]   Type of Service: Integrated Behavioral Health- Individual Interpretor:no  Interpretor Name and Language: none   SUBJECTIVE: Haley Lopez is a 24 y.o. female accompanied by n/a Patient was referred by T. Burleson  for depression. Patient reports the following symptoms/concerns: anxious, racing thoughts, overwhelemd  Duration of problem: ; Severity of problem: mild  OBJECTIVE: Mood: good  and Affect: normal  Risk of harm to self or others: no risk of harm to self or others.   LIFE CONTEXT: Family and Social: Lives in two children and Haley Lopez in [place]  School/Work: Production designer, theatre/television/film @ [employer]  Self-Care: none  Life Changes: New pregnancy   GOALS ADDRESSED: Patient will: 1.  Reduce symptoms of: anxiety  2.  Increase knowledge and/or ability of: practice coping skills to alleviate symptoms and identify triggers.  3.  Demonstrate ability to: self manage symptoms   INTERVENTIONS: Interventions utilized:  Supportive counseling  Standardized Assessments completed:   ASSESSMENT: Patient currently experiencing anxiety affecting pregnancy. Haley Lopez reports feeling anxious and racing thoughts when she is overwhelmed with parenting two small children. According to Haley Lopez, Haley Lopez is helpful when Haley Lopez is feeling anxious. During the visit Haley Lopez and I discuss mindfulness technique will help centering and re focus to alleviate racing thoughts.    Patient may benefit from integrated behavioral health   PLAN: 1. Follow up with behavioral health clinician on : as needed  2. Behavioral recommendations: continue mindfulness techniques and incorporate four year daughter to learn as well, engage in self care and stress relieving activities.  3. Referral(s): none   4. "From scale of 1-10, how likely are you to follow plan?":   Haley Saxon, LCSW

## 2019-09-17 ENCOUNTER — Ambulatory Visit: Payer: Medicaid Other

## 2019-09-19 ENCOUNTER — Ambulatory Visit (INDEPENDENT_AMBULATORY_CARE_PROVIDER_SITE_OTHER): Payer: Medicaid Other

## 2019-09-19 ENCOUNTER — Other Ambulatory Visit: Payer: Self-pay

## 2019-09-19 DIAGNOSIS — Z3A18 18 weeks gestation of pregnancy: Secondary | ICD-10-CM | POA: Diagnosis not present

## 2019-09-19 DIAGNOSIS — Z8751 Personal history of pre-term labor: Secondary | ICD-10-CM

## 2019-09-19 DIAGNOSIS — O09212 Supervision of pregnancy with history of pre-term labor, second trimester: Secondary | ICD-10-CM | POA: Diagnosis not present

## 2019-09-19 MED ORDER — HYDROXYPROGESTERONE CAPROATE 250 MG/ML IM OIL
250.0000 mg | TOPICAL_OIL | Freq: Once | INTRAMUSCULAR | Status: AC
  Administered 2019-09-19: 250 mg via INTRAMUSCULAR

## 2019-09-19 NOTE — Progress Notes (Signed)
Haley Lopez is here for a 17-P injection. Pt tolerated injection well in the left arm without complication. -EH/RMA

## 2019-09-20 ENCOUNTER — Other Ambulatory Visit: Payer: Medicaid Other

## 2019-09-24 ENCOUNTER — Other Ambulatory Visit: Payer: Self-pay | Admitting: *Deleted

## 2019-09-24 ENCOUNTER — Ambulatory Visit: Payer: Medicaid Other | Attending: Obstetrics and Gynecology

## 2019-09-24 ENCOUNTER — Other Ambulatory Visit: Payer: Self-pay

## 2019-09-24 ENCOUNTER — Encounter: Payer: Self-pay | Admitting: Certified Nurse Midwife

## 2019-09-24 ENCOUNTER — Telehealth (INDEPENDENT_AMBULATORY_CARE_PROVIDER_SITE_OTHER): Payer: Medicaid Other | Admitting: Certified Nurse Midwife

## 2019-09-24 ENCOUNTER — Ambulatory Visit (HOSPITAL_BASED_OUTPATIENT_CLINIC_OR_DEPARTMENT_OTHER): Payer: Medicaid Other | Admitting: Genetic Counselor

## 2019-09-24 ENCOUNTER — Ambulatory Visit: Payer: Self-pay | Admitting: Genetic Counselor

## 2019-09-24 ENCOUNTER — Ambulatory Visit: Payer: Medicaid Other | Admitting: *Deleted

## 2019-09-24 VITALS — BP 105/69

## 2019-09-24 VITALS — BP 107/67 | HR 81

## 2019-09-24 DIAGNOSIS — Z315 Encounter for genetic counseling: Secondary | ICD-10-CM

## 2019-09-24 DIAGNOSIS — O2620 Pregnancy care for patient with recurrent pregnancy loss, unspecified trimester: Secondary | ICD-10-CM

## 2019-09-24 DIAGNOSIS — Z148 Genetic carrier of other disease: Secondary | ICD-10-CM

## 2019-09-24 DIAGNOSIS — Z363 Encounter for antenatal screening for malformations: Secondary | ICD-10-CM | POA: Diagnosis not present

## 2019-09-24 DIAGNOSIS — Z3480 Encounter for supervision of other normal pregnancy, unspecified trimester: Secondary | ICD-10-CM | POA: Diagnosis not present

## 2019-09-24 DIAGNOSIS — Z3A19 19 weeks gestation of pregnancy: Secondary | ICD-10-CM

## 2019-09-24 DIAGNOSIS — Z362 Encounter for other antenatal screening follow-up: Secondary | ICD-10-CM

## 2019-09-24 DIAGNOSIS — D563 Thalassemia minor: Secondary | ICD-10-CM

## 2019-09-24 DIAGNOSIS — Z3482 Encounter for supervision of other normal pregnancy, second trimester: Secondary | ICD-10-CM

## 2019-09-24 DIAGNOSIS — Z8751 Personal history of pre-term labor: Secondary | ICD-10-CM

## 2019-09-24 DIAGNOSIS — O09212 Supervision of pregnancy with history of pre-term labor, second trimester: Secondary | ICD-10-CM

## 2019-09-24 DIAGNOSIS — O2622 Pregnancy care for patient with recurrent pregnancy loss, second trimester: Secondary | ICD-10-CM | POA: Diagnosis not present

## 2019-09-24 NOTE — Patient Instructions (Signed)
Alpha-Fetoprotein Test Why am I having this test? The alpha-fetoprotein test is most commonly used in pregnant women to help screen for birth defects in their unborn baby. It can be used to screen for birth defects, such as chromosome (DNA) abnormalities, problems with the brain or spinal cord, or problems with the abdominal wall of the unborn baby (fetus). The alpha-fetoprotein test may also be done for men or non-pregnant women to check for certain cancers. What is being tested? This test measures the amount of alpha-fetoprotein (AFP) in your blood. AFP is a protein that is made by the liver. Levels can be detected in the mother's blood during pregnancy, starting at 10 weeks and peaking at 16-18 weeks of the pregnancy. Abnormal levels can sometimes be a sign of a birth defect in the baby. Certain cancers can cause a high level of AFP in men and non-pregnant women. What kind of sample is taken?  A blood sample is required for this test. It is usually collected by inserting a needle into a blood vessel. How are the results reported? Your test results will be reported as values. Your health care provider will compare your results to normal ranges that were established after testing a large group of people (reference values). Reference values may vary among labs and hospitals. For this test, common reference values are:  Adult: Less than 40 ng/mL or less than 40 mcg/L (SI units).  Child younger than 1 year: Less than 30 ng/mL. If you are pregnant, the values may also vary based on how long you have been pregnant. What do the results mean? Results that are above the reference values in pregnant women may indicate the following for the baby:  Neural tube defects, such as abnormalities of the spinal cord or brain.  Abdominal wall defects.  Multiple pregnancy such as twins.  Fetal distress or fetal death. Results that are above the reference values in men or non-pregnant women may  indicate:  Reproductive cancers, such as ovarian or testicular cancer.  Liver cancer.  Liver cell death.  Other types of cancer. Very low levels of AFP in pregnant women may indicate the following for the baby:  Down syndrome.  Fetal death. Talk with your health care provider about what your results mean. Questions to ask your health care provider Ask your health care provider, or the department that is doing the test:  When will my results be ready?  How will I get my results?  What are my treatment options?  What other tests do I need?  What are my next steps? Summary  The alpha-fetoprotein test is done on pregnant women to help screen for birth defects in their unborn baby.  Certain cancers can cause a high level of AFP in men and non-pregnant women.  For this test, a blood sample is usually collected by inserting a needle into a blood vessel.  Talk with your health care provider about what your results mean. This information is not intended to replace advice given to you by your health care provider. Make sure you discuss any questions you have with your health care provider. Document Revised: 03/10/2017 Document Reviewed: 11/01/2016 Elsevier Patient Education  2020 Elsevier Inc.  

## 2019-09-24 NOTE — Progress Notes (Signed)
09/24/2019  Haley Lopez 12-09-1995 MRN: [id number] DOV: 09/24/2019  I connected withMs.[email or web address]/15/21at1:15 PMESTbyWebExand verified that I am speaking with the correct person using two identifiers.HaleyHoltpresented to the [facility] for a genetics consultation regardingher carrier status for alpha-thalassemia. Haley Lopez presented to her appointment alone.   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 female. Her current pregnancy has completed [redacted]w[redacted]d (Estimated Date of Delivery: 02/14/20). 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 She also has a four year old daughter and a one year old son with that partner. The current pregnancy is her first with a new partner. They are no longer in contact with one another.  Haley Lopez denied exposure to environmental toxins or chemical agents. She denied the use of tobacco. She reported drinking wine on 09/06/19. She also reported using marijuana for nausea. She reported taking prenatal vitamins and Diclegis. She denied significant viral illnesses, fevers, and bleeding during the course of her pregnancy. She had a D&E procedure for a missed abortion at 8 weeks in a previous pregnancy. Her medical and surgical histories were otherwise noncontributory.  Prenatal alcohol exposure can increase the risk for growth delays, small head size, heart defects, eye and facial differences, as well as behavior problems and learning disabilities. The risk of these to occur tends to increase with the amount of alcohol consumed. However, because there is no identified safe amount of alcohol in pregnancy, it is recommended to completely avoid alcohol in pregnancy.  We discussed that the effects of marijuana on pregnancies are still largely unknown. It is difficult to study marijuana use during pregnancy for many reasons.  While most studies have been reassuring regarding birth defects, others have demonstrated that there may be an increased chance for pregnancy complications, problems with placental function, and temporary postnatal withdrawal symptoms in children exposed to marijuana prenatally. Studies are conflicting regarding the risk for problems such as impaired executive functioning, impulsivity, hyperactivity, aggression, depression, and anxiety in children prenatally exposed to marijuana. For this reason, it is recommended to avoid marijuana use during pregnancy.  Family History  A three generation pedigree was drafted and reviewed. The family history is remarkable for the following:  - Haley Lopez reported a personal history of depression and bipolar disorder. Her mother also has depression. We discussed that many forms of mental illness are multifactorial in nature, occurring due to a combination of genetic, lifestyle, and environmental factors. Mental illness can appear to run in families; however, due to the multifactorial nature of mental illnesses such as depression and bipolar disorder, recurrence risks are not well-established.  - Haley Lopez has a maternal half sister whose son has autism. The rest of this child's history is noncontributory, and no one else in the family has autism. We reviewed that autism can be isolated, multifactorial, or part of a genetic syndrome; however, underlying genetic conditions account for less than 10% of autism diagnoses. Recurrence risk for first-degree relatives of individuals with idiopathic autism is estimated to be between 2-8%. Given that Haley Lopez's brother is a fourth-degree relative to her fetus, the risk of recurrence is likely not greatly elevated above the general population risk of ~1 in 40, or 1.5%.  - Haley Lopez's maternal grandmother reportedly had learning difficulties and was held back a grade in school. We discussed that many times, learning difficulties are also  multifactorial in  nature, occurring due to a combination of genetic and environmental factors that are difficult to identify. Learning difficulties can appear to run in families; thus, there is a chance that Haley Lopez's children could also experience learning difficulties of some kind. She understands that she should make the pediatrician aware of any concerns she has about her children's development.  The remaining family histories were reviewed and found to be noncontributory for birth defects, intellectual disability, recurrent pregnancy loss, and known genetic conditions. Haley Lopez had limited information about the father of the baby's family history; thus, risk assessment was limited.  The patient's ethnicity is African American. The father of the pregnancy's ethnicity is [place] American, Native [place], and [place]. Ashkenazi Jewish ancestry and consanguinity were denied. Pedigree will be scanned under Media.  Discussion  Carrier screening results:  Haley Lopez had Horizon-14 carrier screening performed through [place]. The results of the screen identified her as a silent carrier for alpha-thalassemia (aa/a-). Alpha-thalassemia is different in its inheritance compared to other hemoglobinopathies as there are two copies of two alpha globin genes (HBA1 and HBA2) on each chromosome 16, or four alpha globin genes total (aa/aa). A person can be a carrier of one alpha gene mutation (aa/a-), also referred to as a "silent carrier". A person who carries two alpha globin gene mutations can either carry them in cis (both on the same chromosome, denoted as aa/--) or in trans (on different chromosomes, denoted as a-/a-). Alpha-thalassemia carriers of two mutations who have African American ancestry are more likely to have a trans arrangement (a-/a-); cis configuration is reported to be rare in individuals with African American ancestry.     There are several different forms of alpha-thalassemia. The most severe form  of alpha-thalassemia, Hb Barts, is associated with an absence of alpha globin chain synthesis as a result of deletions of all four alpha globin genes (--/--).  Given that Haley Lopez is a silent carrier (aa/a-), her pregnancies would not be at increased risk for Hb Barts, even if her partner is a carrier for alpha-thalassemia, as she will always pass on at least one copy of the alpha globin gene to her children. Hemoglobin H (HbH) disease is caused by three deleted or dysfunctioning alpha globin alleles (a-/--) and is characterized by microcytic hypochromic hemolytic anemia, hepatosplenomegaly, mild jaundice, growth retardation, and sometimes thalassemia-like bone changes. Given Haley Lopez's silent carrier status (aa/a-), the current fetus would only be at risk for HbH disease (a-/--), if the father of the baby is a carrier for two alpha globin mutations in cis (aa/--). If this is the case, the risk for HbH disease in the pregnancy would be 1 in 4 (25%). However, if the father of the baby is a carrier for two alpha globin mutations, he would be more likely to carry them in trans configuration (a-/a-) than the cis configuration (aa/--), given his ethnicity. If he is a carrier of alpha-thalassemia in trans, then the pregnancy would not be at increased risk for HbH disease. Based on the pan-ethnic carrier frequency for alpha-thalassemia, the father of the baby has a 1 in 43 chance of being any type of carrier for alpha-thalassemia.   Haley Lopez's carrier screening was negative for the other 13 conditions screened. Thus, her risk to be a carrier for these additional conditions (listed separately in the laboratory report) has been reduced but not eliminated. This also significantly reduces her risk of having a child affected by one of these conditions. We discussed that carrier testing  for alpha-thalassemia is recommended for the father of the baby. Haley Lopez indicated that she is no longer in contact with the father of the  baby, so partner carrier screening will not be possible.  Aneuploidy screening:  We also reviewed that Haley Lopez had Pan[facility] NIPS through the laboratory [facility] that was low-risk for fetal aneuploidies. We reviewed that these results showed a less than 1 in 10,000 risk for trisomies 21, 18 and 13, and monosomy X (Turner syndrome).  In addition, the risk for triploidy and sex chromosome trisomies (47,XXX and 47,XXY) was also low. Haley Lopez elected to have cfDNA analysis for 22q11.2 deletion syndrome, which was also low risk (1 in 9000). We reviewed that while this testing identifies 94-99% of pregnancies with trisomy 20, trisomy 52, trisomy 4, sex chromosome aneuploidies, and triploidy, it is NOT diagnostic. A positive test result requires confirmation by CVS or amniocentesis, and a negative test result does not rule out a fetal chromosome abnormality. She also understands that this testing does not identify all genetic conditions.  Ultrasound:  A complete ultrasound was performed today prior to our visit. The ultrasound report will be sent under separate cover. There were no visualized fetal anomalies or markers suggestive of aneuploidy.  Diagnostic testing:  Haley Lopez. Warnke was also counseled regarding diagnostic testing via amniocentesis. We discussed the technical aspects of the procedure and quoted up to a 1 in 500 (0.2%) risk for spontaneous pregnancy loss or other adverse pregnancy outcomes as a result of amniocentesis. Cultured cells from an amniocentesis sample allow for the visualization of a fetal karyotype, which can detect >99% of chromosomal aberrations. Chromosomal microarray can also be performed to identify smaller deletions or duplications of fetal chromosomal material. Amniocentesis could also be performed to assess whether the baby is affected by alpha-thalassemia. She was also made aware that postnatal testing for alpha-thalassemia is possible if the baby begins to demonstrate signs of the  condition. After careful consideration, Haley Lopez. Thayne indicated that she would be interested in pursuing amniocentesis. She would like to know definitively whether or not her baby is at risk for hemoglobin H disease, especially given that partner carrier screening is not possible.  Recurrent pregnancy loss:  Finally, we briefly discussed Haley Lopez. Polakowski's history of recurrent pregnancy loss. There are many potential causes of recurrent miscarriages, including anatomic, immunologic, endocrine, and genetic factors. However, a cause is only identified in 50% of individuals who experience recurrent miscarriages. Abnormalities of chromosome number or structure are the most common cause of sporadic early pregnancy loss. A significant proportion of recurrent miscarriages may also be associated with structural or numerical chromosome abnormalities. We discussed that 2-5% of individuals who experience multiple miscarriages carry a balanced translocation that can become unbalanced and potentially lethal in their offspring. We discussed the option of a karyotype to determine if Haley Lopez. Darko carries a balanced translocation that could explain her history of miscarriages. Haley Lopez. Bradshaw indicated that she would be interested in pursuing karyotyping for herself.   Plan:  Haley Lopez. Stillion would like to wait to undergo amniocentesis for alpha-thalassemia at her next ultrasound appointment, which is scheduled for 10/23/19. She would also like to have her blood drawn for maternal karyotype analysis at that time. We will schedule her her accordingly.  I counseled Haley Lopez. Macaulay regarding the above risks and available options. The approximate face-to-face time with the genetic counselor was 35 minutes.  In summary:  Discussed carrier screening results and options for follow-up testing  Silent carrier for alpha-thalassemia  Recommend  partner carrier screening. Patient is no longer in contact with FOB, so partner carrier screening not possible  Would  like to undergo amniocentesis for alpha-thalassemia. Will schedule her for amniocentesis at same time as her next ultrasound in [facility] (10/23/19)  Reviewed low-risk NIPS result  Reduction in risk for Down syndrome, trisomy 34, trisomy 97, triploidy, sex chromosome aneuploidies, and 22q11.2 deletion syndrome  Reviewed results of ultrasound  No fetal anomalies or markers seen  Reduction in risk for fetal aneuploidy  Offered additional testing and screening  Interested in undergoing karyotype analysis for history of recurrent pregnancy loss. We will draw a sample for this testing at her next appointment  Reviewed family history concerns   Gershon Crane, Haley Lopez, [facility] Genetic Counselor
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
## 2019-09-24 NOTE — Progress Notes (Signed)
Pt would like to discuss note for work to be able get more frequent breaks.  Pt states she needs more frequent bathroom breaks and her job is giving her a hard time.

## 2019-09-26 ENCOUNTER — Ambulatory Visit: Payer: Medicaid Other

## 2019-10-03 ENCOUNTER — Ambulatory Visit: Payer: Medicaid Other

## 2019-10-07 ENCOUNTER — Ambulatory Visit (INDEPENDENT_AMBULATORY_CARE_PROVIDER_SITE_OTHER): Payer: Medicaid Other

## 2019-10-07 ENCOUNTER — Other Ambulatory Visit: Payer: Self-pay

## 2019-10-07 ENCOUNTER — Encounter (HOSPITAL_COMMUNITY): Payer: Self-pay | Admitting: Obstetrics and Gynecology

## 2019-10-07 ENCOUNTER — Inpatient Hospital Stay (HOSPITAL_COMMUNITY)
Admission: AD | Admit: 2019-10-07 | Discharge: 2019-10-07 | Disposition: A | Discharge status: Home / Self Care | Payer: Medicaid Other | Attending: Obstetrics and Gynecology | Admitting: Obstetrics and Gynecology

## 2019-10-07 DIAGNOSIS — R109 Unspecified abdominal pain: Secondary | ICD-10-CM | POA: Diagnosis present

## 2019-10-07 DIAGNOSIS — Z91040 Latex allergy status: Secondary | ICD-10-CM | POA: Insufficient documentation

## 2019-10-07 DIAGNOSIS — O4702 False labor before 37 completed weeks of gestation, second trimester: Secondary | ICD-10-CM

## 2019-10-07 DIAGNOSIS — Z3A21 21 weeks gestation of pregnancy: Secondary | ICD-10-CM

## 2019-10-07 DIAGNOSIS — Z8751 Personal history of pre-term labor: Secondary | ICD-10-CM

## 2019-10-07 DIAGNOSIS — Z3480 Encounter for supervision of other normal pregnancy, unspecified trimester: Secondary | ICD-10-CM

## 2019-10-07 DIAGNOSIS — O09212 Supervision of pregnancy with history of pre-term labor, second trimester: Secondary | ICD-10-CM

## 2019-10-07 DIAGNOSIS — Z87891 Personal history of nicotine dependence: Secondary | ICD-10-CM | POA: Insufficient documentation

## 2019-10-07 LAB — URINALYSIS, ROUTINE W REFLEX MICROSCOPIC
Bilirubin Urine: NEGATIVE
Glucose, UA: NEGATIVE mg/dL
Hgb urine dipstick: NEGATIVE
Ketones, ur: NEGATIVE mg/dL
Leukocytes,Ua: NEGATIVE
Nitrite: NEGATIVE
Protein, ur: NEGATIVE mg/dL
Specific Gravity, Urine: 1.018 (ref 1.005–1.030)
pH: 7 (ref 5.0–8.0)

## 2019-10-07 MED ORDER — HYDROXYPROGESTERONE CAPROATE 275 MG/1.1ML ~~LOC~~ SOAJ
275.0000 mg | Freq: Once | SUBCUTANEOUS | Status: AC
  Administered 2019-10-30: 275 mg via SUBCUTANEOUS

## 2019-10-07 NOTE — MAU Note (Signed)
Has been having contractions for the past wk.  Called her dr and was told to come up here. (wasn't driving at the time so was unable).  When she breathes in and out - she has pain in her RUQ, when sitting it is worse.  Denies bleeding or LOF.  Getting 17p, has missed a couple doses.

## 2019-10-07 NOTE — Progress Notes (Signed)
Patient was assessed and managed by nursing staff during this encounter. I have reviewed the chart and agree with the documentation and plan. I have also made any necessary editorial changes.  Catalina Antigua, MD 10/07/2019 1:01 PM

## 2019-10-07 NOTE — Progress Notes (Signed)
17p given L arm w/o difficulty

## 2019-10-07 NOTE — Discharge Instructions (Signed)
Abdominal Pain During Pregnancy  Belly (abdominal) pain is common during pregnancy. There are many possible causes. Most of the time, it is not a serious problem. Other times, it can be a sign that something is wrong with the pregnancy. Always tell your doctor if you have belly pain. Follow these instructions at home:  Do not have sex or put anything in your vagina until your pain goes away completely.  Get plenty of rest until your pain gets better.  Drink enough fluid to keep your pee (urine) pale yellow.  Take over-the-counter and prescription medicines only as told by your doctor.  Keep all follow-up visits as told by your doctor. This is important. Contact a doctor if:  Your pain continues or gets worse after resting.  You have lower belly pain that: ? Comes and goes at regular times. ? Spreads to your back. ? Feels like menstrual cramps.  You have pain or burning when you pee (urinate). Get help right away if:  You have a fever or chills.  You have vaginal bleeding.  You are leaking fluid from your vagina.  You are passing tissue from your vagina.  You throw up (vomit) for more than 24 hours.  You have watery poop (diarrhea) for more than 24 hours.  Your baby is moving less than usual.  You feel very weak or faint.  You have shortness of breath.  You have very bad pain in your upper belly. Summary  Belly (abdominal) pain is common during pregnancy. There are many possible causes.  If you have belly pain during pregnancy, tell your doctor right away.  Keep all follow-up visits as told by your doctor. This is important. This information is not intended to replace advice given to you by your health care provider. Make sure you discuss any questions you have with your health care provider. Document Revised: 07/16/2018 Document Reviewed: 06/30/2016 Elsevier Patient Education  2020 Elsevier Inc.  

## 2019-10-07 NOTE — MAU Provider Note (Signed)
History     CSN: [id number]  Arrival date and time: 10/07/19 1408   First Provider Initiated Contact with Patient 10/07/19 1454      Chief Complaint  Patient presents with  . Abdominal Pain  . Contractions   HPI Haley Lopez is a 24 yo [id number] at 29.3 EGA who is presenting to MAU complaining of contractions. She says that have been occurring on and off for about a week. They were 10 minutes apart yesterday, now she is having about 2 contractions per hour. She reports drinking around 24 ounces of water daily. She is feeling her baby move, denies vaginal bleeding or LOF. She does have a history of preterm labor, with delivery at 51.6 EGA with her first baby- she is on 17P shots at the [facility] office, though she does endorse occasionally missing an injection.  She is also experiencing some lower pelvic pain. It feels achy and is located in the inguinal region. She denies radiation.   Incidentally, she is also complaining of RUQ pain. She describes it as sharp and worse with exhalation. It is worse when she presses on that side as well, but is not worse when eating. She denies reflux or heartburn symptoms. She denies nausea, vomiting, diarrhea or constipation. Better with heating pad. Intermittent, not constant.  OB History    Gravida  6   Para  2   Term  1   Preterm  1   AB  3   Living  2     SAB  3   TAB  0   Ectopic  0   Multiple  0   Live Births  2           Past Medical History:  Diagnosis Date  . Anemia   . Anxiety   . Constipation   . Depression   . Herpes   . Vision abnormalities     Past Surgical History:  Procedure Laterality Date  . DILATION AND EVACUATION N/A 04/21/2016   Procedure: DILATATION AND EVACUATION;  Surgeon: Lavina Hamman, MD;  Location: [facility];  Service: Gynecology;  Laterality: N/A;  . WISDOM TOOTH EXTRACTION      Family History  Problem Relation Age of Onset  . Depression Mother     Social History   Tobacco Use  . Smoking  status: Former Smoker    Types: Cigars  . Smokeless tobacco: Never Used  . Tobacco comment: with marijuana  Vaping Use  . Vaping Use: Never used  Substance Use Topics  . Alcohol use: Yes    Comment: social  . Drug use: Yes    Frequency: 14.0 times per week    Types: Marijuana    Comment: last used early June 2021    Allergies:  Allergies  Allergen Reactions  . Latex     Rash with condoms    Facility-Administered Medications Prior to Admission  Medication Dose Route Frequency Provider Last Rate Last Admin  . HYDROXYprogesterone caproate (Makena) autoinjector 275 mg  275 mg Subcutaneous Weekly Brock Bad, MD   275 mg at 10/07/19 1003  . HYDROXYprogesterone caproate (Makena) autoinjector 275 mg  275 mg Subcutaneous Once Constant, Peggy, MD       Medications Prior to Admission  Medication Sig Dispense Refill Last Dose  . Doxylamine-Pyridoxine (DICLEGIS) 10-10 MG TBEC Take 2 tablets at bedtime and one in the morning and one in the afternoon as needed for nausea. 60 tablet 2 10/06/2019 at Unknown time  . Prenatal Vit-Fe  Phos-FA-Omega (VITAFOL GUMMIES) 3.33-0.333-34.8 MG CHEW Chew 1 tablet by mouth daily. 90 tablet 5 10/06/2019 at Unknown time  . promethazine (PHENERGAN) 25 MG tablet Take 1 tablet (25 mg total) by mouth every 6 (six) hours as needed for nausea or vomiting. 30 tablet 1     Review of Systems  Constitutional: Negative.   HENT: Negative.   Eyes: Negative.   Respiratory: Negative.   Cardiovascular: Negative.   Gastrointestinal: Negative for constipation, diarrhea, nausea and vomiting. Abdominal pain: RUQ.  Endocrine: Negative.   Genitourinary: Positive for pelvic pain. Negative for decreased urine volume, difficulty urinating, dyspareunia, dysuria, enuresis, flank pain, frequency, genital sores, hematuria, urgency, vaginal bleeding, vaginal discharge and vaginal pain.  Musculoskeletal: Negative.   Skin: Negative.   Allergic/Immunologic: Negative.    Neurological: Negative.   Hematological: Negative.   Psychiatric/Behavioral: Negative.    Physical Exam   Blood pressure (!) 111/51, pulse 88, temperature 99 F (37.2 C), temperature source Oral, resp. rate 16, height 5\' 1"  (1.549 m), weight 50.6 kg, last menstrual period 05/10/2019, SpO2 98 %, not currently breastfeeding.  Physical Exam Vitals and nursing note reviewed. Exam conducted with a chaperone present.  Constitutional:      Appearance: She is well-developed and normal weight.  HENT:     Head: Normocephalic and atraumatic.     Mouth/Throat:     Mouth: Mucous membranes are moist.     Pharynx: Oropharynx is clear.  Eyes:     Extraocular Movements: Extraocular movements intact.     Pupils: Pupils are equal, round, and reactive to light.  Cardiovascular:     Rate and Rhythm: Normal rate and regular rhythm.     Heart sounds: Normal heart sounds.  Pulmonary:     Effort: Pulmonary effort is normal.     Breath sounds: Normal breath sounds.  Abdominal:     General: Bowel sounds are normal.     Palpations: Abdomen is soft. There is no shifting dullness, fluid wave, hepatomegaly, splenomegaly, mass or pulsatile mass.     Tenderness: There is abdominal tenderness in the right upper quadrant. There is no right CVA tenderness, left CVA tenderness, guarding or rebound. Negative signs include Murphy's sign, Rovsing's sign, McBurney's sign and psoas sign.     Hernia: No hernia is present.     Comments: Gravid RUQ worse with palpation, exhalation dysfunction noted upon palpation of the patients ribs 8-10  Genitourinary:    Vagina: Normal.     Cervix: Normal.     Uterus: Normal.      Adnexa: Right adnexa normal and left adnexa normal.     Comments: SVE 1/thick/-3 Skin:    General: Skin is warm and dry.  Neurological:     General: No focal deficit present.     Mental Status: She is alert.  Psychiatric:        Mood and Affect: Mood normal.        Behavior: Behavior normal.      MAU Course  Procedures  MDM - Likely Braxton Hicks contractions, no regular pattern - SVE: 1/thick/-3 - Will observe on TOCO - RUQ likely not due to pre-E (BP 111/51, no other symptoms) - Exhalation dysfunction noted on the patient's right ribs 8-10. Patient offered OMT to see if this will resolve her pain.  OMT note: -indication: somatic dysfunction ribs 8-10 on the right, specifically exhalation dysfunction -verbal informed consent given for OMT -rib raising performed -patient tolerated procedure well, reassessment of the ribs reveals resolution of the  exhalation dysfunction- patient reported improvement in RUQ pain (resolved)  RECHECK: 1614 - Patient's RUQ still resolved, no recurrence - SVE: 1/thick/-3 - no contractions observed on TOCO  Assessment and Plan  24 yo [id number] at 21.3 EGA who is presenting to MAU complaining of contractions - no contractions on TOCO, no cervical change - RUQ pain resolved- likely MSK in nature - return precautions given - DC home with strict return precautions  Hurshel Bouillon L Errol Ala 10/07/2019, 2:59 PM

## 2019-10-15 ENCOUNTER — Ambulatory Visit (INDEPENDENT_AMBULATORY_CARE_PROVIDER_SITE_OTHER): Payer: Medicaid Other

## 2019-10-15 ENCOUNTER — Other Ambulatory Visit: Payer: Self-pay

## 2019-10-15 DIAGNOSIS — O09212 Supervision of pregnancy with history of pre-term labor, second trimester: Secondary | ICD-10-CM | POA: Diagnosis not present

## 2019-10-15 DIAGNOSIS — Z3A22 22 weeks gestation of pregnancy: Secondary | ICD-10-CM

## 2019-10-15 DIAGNOSIS — Z8751 Personal history of pre-term labor: Secondary | ICD-10-CM

## 2019-10-15 MED ORDER — HYDROXYPROGESTERONE CAPROATE 275 MG/1.1ML ~~LOC~~ SOAJ
275.0000 mg | Freq: Once | SUBCUTANEOUS | Status: AC
  Administered 2019-10-15: 275 mg via SUBCUTANEOUS

## 2019-10-15 NOTE — Progress Notes (Addendum)
SUBJECTIVE OB presents for 17P injection, given in RA, tolerated well.  PLAN Return in 1 week for 17P, keep OB appt.  Administrations This Visit    HYDROXYprogesterone caproate (Makena) autoinjector 275 mg    Admin Date 10/15/2019 Action Given Dose 275 mg Route Subcutaneous Administered By Maretta Bees, RMA

## 2019-10-15 NOTE — Progress Notes (Signed)
Chart reviewed for nurse visit. Agree with plan of care.  ° °Charmayne Odell L, DO °10/15/2019 9:06 AM  ° ° °

## 2019-10-22 ENCOUNTER — Other Ambulatory Visit: Payer: Self-pay

## 2019-10-22 ENCOUNTER — Encounter: Payer: Medicaid Other | Admitting: Obstetrics and Gynecology

## 2019-10-22 ENCOUNTER — Ambulatory Visit (INDEPENDENT_AMBULATORY_CARE_PROVIDER_SITE_OTHER): Payer: Medicaid Other | Admitting: Obstetrics and Gynecology

## 2019-10-22 VITALS — BP 107/69 | HR 65 | Wt 112.0 lb

## 2019-10-22 DIAGNOSIS — D563 Thalassemia minor: Secondary | ICD-10-CM

## 2019-10-22 DIAGNOSIS — O99342 Other mental disorders complicating pregnancy, second trimester: Secondary | ICD-10-CM

## 2019-10-22 DIAGNOSIS — O98512 Other viral diseases complicating pregnancy, second trimester: Secondary | ICD-10-CM

## 2019-10-22 DIAGNOSIS — Z3A23 23 weeks gestation of pregnancy: Secondary | ICD-10-CM

## 2019-10-22 DIAGNOSIS — O99012 Anemia complicating pregnancy, second trimester: Secondary | ICD-10-CM

## 2019-10-22 DIAGNOSIS — B009 Herpesviral infection, unspecified: Secondary | ICD-10-CM

## 2019-10-22 DIAGNOSIS — O09212 Supervision of pregnancy with history of pre-term labor, second trimester: Secondary | ICD-10-CM | POA: Diagnosis not present

## 2019-10-22 DIAGNOSIS — Z8751 Personal history of pre-term labor: Secondary | ICD-10-CM

## 2019-10-22 DIAGNOSIS — F419 Anxiety disorder, unspecified: Secondary | ICD-10-CM

## 2019-10-22 DIAGNOSIS — Z3480 Encounter for supervision of other normal pregnancy, unspecified trimester: Secondary | ICD-10-CM

## 2019-10-22 NOTE — Progress Notes (Signed)
ROB/17P.  Reports no problems today. 17P injection given in [place], tolerated well  Administrations This Visit    HYDROXYprogesterone caproate (Makena) autoinjector 275 mg    Admin Date 10/22/2019 Action Given Dose 275 mg Route Subcutaneous Administered By Maretta Bees, RMA
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
## 2019-10-23 ENCOUNTER — Telehealth: Payer: Self-pay | Admitting: *Deleted

## 2019-10-23 ENCOUNTER — Ambulatory Visit: Payer: Medicaid Other | Admitting: *Deleted

## 2019-10-23 ENCOUNTER — Ambulatory Visit: Payer: Medicaid Other

## 2019-10-23 ENCOUNTER — Other Ambulatory Visit: Payer: Self-pay | Admitting: Obstetrics

## 2019-10-23 ENCOUNTER — Ambulatory Visit: Payer: Medicaid Other | Attending: Obstetrics and Gynecology

## 2019-10-23 VITALS — BP 110/61 | HR 74

## 2019-10-23 DIAGNOSIS — Z36 Encounter for antenatal screening for chromosomal anomalies: Secondary | ICD-10-CM | POA: Diagnosis not present

## 2019-10-23 DIAGNOSIS — O09213 Supervision of pregnancy with history of pre-term labor, third trimester: Secondary | ICD-10-CM | POA: Diagnosis not present

## 2019-10-23 DIAGNOSIS — Z3A23 23 weeks gestation of pregnancy: Secondary | ICD-10-CM | POA: Insufficient documentation

## 2019-10-23 DIAGNOSIS — O09212 Supervision of pregnancy with history of pre-term labor, second trimester: Secondary | ICD-10-CM | POA: Diagnosis not present

## 2019-10-23 DIAGNOSIS — O2622 Pregnancy care for patient with recurrent pregnancy loss, second trimester: Secondary | ICD-10-CM | POA: Insufficient documentation

## 2019-10-23 DIAGNOSIS — Z362 Encounter for other antenatal screening follow-up: Secondary | ICD-10-CM | POA: Diagnosis not present

## 2019-10-23 DIAGNOSIS — Z148 Genetic carrier of other disease: Secondary | ICD-10-CM | POA: Diagnosis not present

## 2019-10-23 DIAGNOSIS — O0990 Supervision of high risk pregnancy, unspecified, unspecified trimester: Secondary | ICD-10-CM

## 2019-10-23 DIAGNOSIS — D563 Thalassemia minor: Secondary | ICD-10-CM

## 2019-10-23 DIAGNOSIS — Z3480 Encounter for supervision of other normal pregnancy, unspecified trimester: Secondary | ICD-10-CM

## 2019-10-23 DIAGNOSIS — N96 Recurrent pregnancy loss: Secondary | ICD-10-CM

## 2019-10-23 NOTE — Telephone Encounter (Signed)
Call from patient post amniocentesis. Pt reporting some cramping and is unsure if she will feel like going to work tomorrow. Per Dr. Judeth Cornfield, pt instructed that she will need to be evaluated if she continues to have cramping. Pt voiced understanding.

## 2019-10-24 ENCOUNTER — Encounter (HOSPITAL_COMMUNITY): Payer: Self-pay | Admitting: Obstetrics and Gynecology

## 2019-10-24 ENCOUNTER — Inpatient Hospital Stay (HOSPITAL_COMMUNITY)
Admission: AD | Admit: 2019-10-24 | Discharge: 2019-10-24 | Disposition: A | Discharge status: Home / Self Care | Payer: Medicaid Other | Attending: Obstetrics and Gynecology | Admitting: Obstetrics and Gynecology

## 2019-10-24 ENCOUNTER — Telehealth: Payer: Self-pay | Admitting: *Deleted

## 2019-10-24 ENCOUNTER — Other Ambulatory Visit: Payer: Self-pay

## 2019-10-24 DIAGNOSIS — O98513 Other viral diseases complicating pregnancy, third trimester: Secondary | ICD-10-CM | POA: Diagnosis present

## 2019-10-24 DIAGNOSIS — Z3A36 36 weeks gestation of pregnancy: Secondary | ICD-10-CM | POA: Diagnosis not present

## 2019-10-24 DIAGNOSIS — B9689 Other specified bacterial agents as the cause of diseases classified elsewhere: Secondary | ICD-10-CM | POA: Insufficient documentation

## 2019-10-24 DIAGNOSIS — Z87891 Personal history of nicotine dependence: Secondary | ICD-10-CM | POA: Diagnosis not present

## 2019-10-24 DIAGNOSIS — Z3480 Encounter for supervision of other normal pregnancy, unspecified trimester: Secondary | ICD-10-CM

## 2019-10-24 DIAGNOSIS — O09213 Supervision of pregnancy with history of pre-term labor, third trimester: Secondary | ICD-10-CM | POA: Diagnosis not present

## 2019-10-24 DIAGNOSIS — Z79899 Other long term (current) drug therapy: Secondary | ICD-10-CM | POA: Diagnosis not present

## 2019-10-24 DIAGNOSIS — R109 Unspecified abdominal pain: Secondary | ICD-10-CM

## 2019-10-24 DIAGNOSIS — Z8759 Personal history of other complications of pregnancy, childbirth and the puerperium: Secondary | ICD-10-CM | POA: Insufficient documentation

## 2019-10-24 LAB — URINALYSIS, ROUTINE W REFLEX MICROSCOPIC
Bilirubin Urine: NEGATIVE
Glucose, UA: NEGATIVE mg/dL
Hgb urine dipstick: NEGATIVE
Ketones, ur: NEGATIVE mg/dL
Leukocytes,Ua: NEGATIVE
Nitrite: NEGATIVE
Protein, ur: NEGATIVE mg/dL
Specific Gravity, Urine: 1.023 (ref 1.005–1.030)
pH: 6 (ref 5.0–8.0)

## 2019-10-24 LAB — WET PREP, GENITAL
Sperm: NONE SEEN
Trich, Wet Prep: NONE SEEN
Yeast Wet Prep HPF POC: NONE SEEN

## 2019-10-24 LAB — POCT FERN TEST: POCT Fern Test: NEGATIVE

## 2019-10-24 MED ORDER — METRONIDAZOLE 500 MG PO TABS
500.0000 mg | ORAL_TABLET | Freq: Two times a day (BID) | ORAL | 0 refills | Status: DC
Start: 2019-10-24 — End: 2019-11-14

## 2019-10-24 NOTE — MAU Note (Signed)
Pt reports she had an amniocentesis yesterday. Went home started having some cramping off and on. Took some tylenol  With some releif.. cramping not as much today but reporting a thick vaginal clear  discharge with a fishy odor. Good fetal movement reported.

## 2019-10-24 NOTE — MAU Provider Note (Signed)
Chief Complaint:  Abdominal Pain and Vaginal Discharge   First Provider Initiated Contact with Patient 10/24/19 0959     HPI: Haley Lopez is a 24 y.o. [id number] at [redacted]w[redacted]d who presents to maternity admissions reporting abdominal cramping and watery discharge post amniocentesis yesterday. She had cramping yesterday after the procedure that eased after taking Tylenol, but it came back today and she began seeing an increase in discharge. Denies vaginal bleeding, decreased fetal movement, fever, falls, or recent illness.   Pregnancy Course: On 17P for hx of preterm birth, alpha thal silent carrier  Past Medical History:  Diagnosis Date  . Anemia   . Anxiety   . Constipation   . Depression   . Herpes   . Vision abnormalities    OB History  Gravida Para Term Preterm AB Living  6 2 1 1 3 2   SAB TAB Ectopic Multiple Live Births  3 0 0 0 2    # Outcome Date GA Lbr Len/2nd Weight Sex Delivery Anes PTL Lv  6 Current           5 Term 09/06/18 [redacted]w[redacted]d  5 lb 5 oz (2.41 kg) M Vag-Spont EPI  LIV     Birth Comments: WDL  4 SAB 04/21/16 [redacted]w[redacted]d   U         Birth Comments: Needed D&E for missed abortion      Complications: Missed abortion  3 Preterm 04/08/15 [redacted]w[redacted]d 05:42 / 00:15 3 lb 12.7 oz (1.72 kg) F Vag-Spont EPI  LIV     Birth Comments: Dr [redacted]w[redacted]d documented [redacted]w[redacted]d  2 SAB           1 SAB            Past Surgical History:  Procedure Laterality Date  . DILATION AND EVACUATION N/A 04/21/2016   Procedure: DILATATION AND EVACUATION;  Surgeon: 06/19/2016, MD;  Location: [facility];  Service: Gynecology;  Laterality: N/A;  . WISDOM TOOTH EXTRACTION     Family History  Problem Relation Age of Onset  . Depression Mother    Social History   Tobacco Use  . Smoking status: Former Smoker    Types: Cigars  . Smokeless tobacco: Never Used  . Tobacco comment: with marijuana  Vaping Use  . Vaping Use: Never used  Substance Use Topics  . Alcohol use: Not Currently    Comment: social  . Drug use: Yes     Frequency: 14.0 times per week    Types: Marijuana    Comment: last used early June 2021   Allergies  Allergen Reactions  . Latex     Rash with condoms   No medications prior to admission.    I have reviewed patient's Past Medical Hx, Surgical Hx, Family Hx, Social Hx, medications and allergies.   ROS:  Review of Systems  Genitourinary: Positive for pelvic pain and vaginal discharge. Negative for vaginal bleeding.  All other systems reviewed and are negative.   Physical Exam   Patient Vitals for the past 24 hrs:  BP Temp Pulse Resp Height Weight  10/24/19 1115 108/62 -- 60 -- -- --  10/24/19 0918 109/74 98.1 F (36.7 C) 73 18 5\' 1"  (1.549 m) 112 lb (50.8 kg)    Constitutional: Well-developed, well-nourished female in no acute distress.  Cardiovascular: normal rate & rhythm, no murmur Respiratory: normal effort, lung sounds clear throughout GI: Abd soft, non-tender, gravid appropriate for gestational age. Pos BS x 4 MS: Extremities nontender, no edema, normal ROM  Neurologic: Alert and oriented x 4.  GU:      Pelvic: NEFG, copious off white, somewhat watery discharge, no blood, cervix clean.   Dilation: Closed Exam by:: Mohamud Mrozek cnm  Fetal Tracing: reactive Baseline: 140 Variability: moderate Accelerations: present Decelerations: none Toco: none   Labs: Results for orders placed or performed during the hospital encounter of 10/24/19 (from the past 24 hour(s))  Urinalysis, Routine w reflex microscopic     Status: None   Collection Time: 10/24/19  9:20 AM  Result Value Ref Range   Color, Urine YELLOW YELLOW   APPearance CLEAR CLEAR   Specific Gravity, Urine 1.023 1.005 - 1.030   pH 6.0 5.0 - 8.0   Glucose, UA NEGATIVE NEGATIVE mg/dL   Hgb urine dipstick NEGATIVE NEGATIVE   Bilirubin Urine NEGATIVE NEGATIVE   Ketones, ur NEGATIVE NEGATIVE mg/dL   Protein, ur NEGATIVE NEGATIVE mg/dL   Nitrite NEGATIVE NEGATIVE   Leukocytes,Ua NEGATIVE NEGATIVE  Wet prep, genital      Status: Abnormal   Collection Time: 10/24/19 10:11 AM   Specimen: PATH Cytology Cervicovaginal Ancillary Only  Result Value Ref Range   Yeast Wet Prep HPF POC NONE SEEN NONE SEEN   Trich, Wet Prep NONE SEEN NONE SEEN   Clue Cells Wet Prep HPF POC PRESENT (A) NONE SEEN   WBC, Wet Prep HPF POC MANY (A) NONE SEEN   Sperm NONE SEEN   POCT fern test     Status: None   Collection Time: 10/24/19 11:08 AM  Result Value Ref Range   POCT Fern Test Negative = intact amniotic membranes     Imaging:  None  MAU Course: Orders Placed This Encounter  Procedures  . Wet prep, genital  . Urinalysis, Routine w reflex microscopic  . POCT fern test  . Discharge patient   Meds ordered this encounter  Medications  . metroNIDAZOLE (FLAGYL) 500 MG tablet    Sig: Take 1 tablet (500 mg total) by mouth 2 (two) times daily.    Dispense:  14 tablet    Refill:  0    Order Specific Question:   Supervising Provider    Answer:   Samara Snide    MDM: NST reactive Wet prep abnormal, + for BV Gc/ct pending  Assessment: 1. Bacterial vaginosis   2. Supervision of other normal pregnancy, antepartum   3. Cramping affecting pregnancy, antepartum    Plan: Discharge home in stable condition.  Preterm labor/bleeding precautions given  Follow-up Information    [facility]. Go to.   Specialty: Obstetrics and Gynecology Why: As scheduled for ongoing prenatal care Contact information: [address] [place] [address] [phone]              Allergies as of 10/24/2019      Reactions   Latex    Rash with condoms      Medication List    TAKE these medications   acetaminophen 500 MG tablet Commonly known as: TYLENOL Take 500 mg by mouth every 6 (six) hours as needed.   Doxylamine-Pyridoxine 10-10 MG Tbec Commonly known as: Diclegis Take 2 tablets at bedtime and one in the morning and one in the afternoon as needed for  nausea.   metroNIDAZOLE 500 MG tablet Commonly known as: FLAGYL Take 1 tablet (500 mg total) by mouth 2 (two) times daily.   promethazine 25 MG tablet Commonly known as: PHENERGAN Take 1 tablet (25 mg total) by  mouth every 6 (six) hours as needed for nausea or vomiting.   Vitafol Gummies 3.33-0.333-34.8 MG Chew Chew 1 tablet by mouth daily.       Bernerd Limbo, CNM 10/24/2019 11:20 AM

## 2019-10-24 NOTE — Discharge Instructions (Signed)

## 2019-10-25 LAB — GC/CHLAMYDIA PROBE AMP (~~LOC~~) NOT AT ARMC
Chlamydia: NEGATIVE
Comment: NEGATIVE
Comment: NORMAL
Neisseria Gonorrhea: NEGATIVE

## 2019-10-30 ENCOUNTER — Ambulatory Visit (INDEPENDENT_AMBULATORY_CARE_PROVIDER_SITE_OTHER): Payer: Medicaid Other

## 2019-10-30 ENCOUNTER — Other Ambulatory Visit: Payer: Self-pay

## 2019-10-30 DIAGNOSIS — Z8751 Personal history of pre-term labor: Secondary | ICD-10-CM

## 2019-10-30 DIAGNOSIS — O09212 Supervision of pregnancy with history of pre-term labor, second trimester: Secondary | ICD-10-CM | POA: Diagnosis not present

## 2019-10-30 DIAGNOSIS — Z3A26 26 weeks gestation of pregnancy: Secondary | ICD-10-CM

## 2019-10-30 NOTE — Progress Notes (Signed)
Nurse visit for Makena 17p given LA without difficulty

## 2019-10-30 NOTE — Progress Notes (Signed)
Patient was assessed and managed by nursing staff during this encounter. I have reviewed the chart and agree with the documentation and plan. I have also made any necessary editorial changes.  Catalina Antigua, MD 10/30/2019 4:34 PM

## 2019-11-04 ENCOUNTER — Telehealth: Payer: Self-pay | Admitting: Genetic Counselor

## 2019-11-04 LAB — ALPHA-THALASSEMIA GENOTYPR

## 2019-11-04 NOTE — Telephone Encounter (Signed)
Attempted to call Ms. Cerveny to discuss negative results from alpha-thalassemia testing on her amniocentesis and normal maternal karyotype; however, mailbox was full so I could not leave a message. Will try again tomorrow.  Gershon Crane, MS, [facility] Genetic Counselor

## 2019-11-04 NOTE — Telephone Encounter (Signed)
Received call back from Haley Lopez. Haley Lopez underwent an amniocentesis on 10/23/19 due to her carrier status for alpha-thalassemia. Haley Lopez was identified as a silent carrier for alpha-thalassemia, and the father of the baby is not available for partner carrier screening. Haley Lopez also had her blood drawn for chromosome analysis due to her history of recurrent miscarriages.  I informed Haley Lopez that her maternal karyotype was normal (46,XX). This indicates that her history of recurrent miscarriages is likely not due to a balanced translocation or other chromosomal rearrangement. I also informed Haley Lopez that alpha-thalassemia testing on her amniotic fluid sample was negative AKA normal. The fetus was found to have all four copies of the alpha globin gene present, significantly reducing the chance of the fetus being affected by alpha-thalassemia.   I reminded Haley Lopez that karyotype on the amniotic fluid sample is still pending. Results will likely be returned in the next week or two. I will call her once those results become available. She confirmed that she had no further questions at this time.  Gershon Crane, MS, [facility] Genetic Counselor

## 2019-11-05 LAB — CHROMOSOME, AMNIOTIC FLUID
Cells Analyzed: 15
Cells Counted: 15
Cells Karyotyped: 2
Colonies: 15
GTG Band Resolution Achieved: 450

## 2019-11-06 ENCOUNTER — Other Ambulatory Visit: Payer: Self-pay

## 2019-11-06 ENCOUNTER — Ambulatory Visit (INDEPENDENT_AMBULATORY_CARE_PROVIDER_SITE_OTHER): Payer: Medicaid Other

## 2019-11-06 DIAGNOSIS — Z3A25 25 weeks gestation of pregnancy: Secondary | ICD-10-CM

## 2019-11-06 DIAGNOSIS — O09212 Supervision of pregnancy with history of pre-term labor, second trimester: Secondary | ICD-10-CM

## 2019-11-06 DIAGNOSIS — Z8751 Personal history of pre-term labor: Secondary | ICD-10-CM

## 2019-11-06 NOTE — Progress Notes (Signed)
Pt is in the office for 17-p injection, administered in R arm and pt tolerated well. .. Administrations This Visit    HYDROXYprogesterone caproate (Makena) autoinjector 275 mg    Admin Date 11/06/2019 Action Given Dose 275 mg Route Subcutaneous Administered By [name], RN

## 2019-11-07 ENCOUNTER — Telehealth: Payer: Self-pay | Admitting: Genetic Counselor

## 2019-11-07 NOTE — Telephone Encounter (Signed)
Attempted to call Ms. Walck to discuss normal fetal karyotype results from her amniocentesis. However, no one answered and I received a message that the call could not be completed at this time. Will try again later.  Gershon Crane, MS, [facility] Genetic Counselor

## 2019-11-11 ENCOUNTER — Telehealth: Payer: Self-pay | Admitting: Genetic Counselor

## 2019-11-11 NOTE — Telephone Encounter (Signed)
Attempted to call Ms. Sweeting again to discuss normal fetal karyotype results (46,XY) from her amniocentesis. However, no one answered and I received the same message that the call could not be completed at this time. Will check to see if OBGYN provider can inform her of these results at her next in-person appointment on 11/20/19 if I do not hear back from Ms. Been before then.  Gershon Crane, MS, [facility] Genetic Counselor

## 2019-11-12 ENCOUNTER — Telehealth: Payer: Self-pay | Admitting: Genetic Counselor

## 2019-11-12 NOTE — Telephone Encounter (Signed)
Received a voicemail from Ms. Mineau earlier today checking in on her test results and informing me that she has a new phone number [phone]). She requested that I call her when she got off work at [employer] today. I called her new number at 4:00 and informed her of her negative karyotype results from amniocentesis. Results from fetal karyotype revealed a normal female complement (46,XY). This significantly reduces the possibility of a chromosomal aneuploidy such as Down syndrome, trisomy 78, or trisomy 93 in this fetus, as amniocentesis is able to diagnose chromosome aneuploidies with a sensitivity of >99%. She was relieved to hear these results and confirmed that she had no further questions at this time. She also gave me her permission to update her phone number in Epic.   Gershon Crane, MS, [facility] Genetic Counselor

## 2019-11-13 ENCOUNTER — Ambulatory Visit: Payer: Medicaid Other

## 2019-11-13 LAB — MATERNAL CELL CONTAMINATION

## 2019-11-13 LAB — CHROMOSOME, BLOOD, ROUTINE
Cells Analyzed: 20
Cells Counted: 20
Cells Karyotyped: 2
GTG Band Resolution Achieved: 500

## 2019-11-13 LAB — ALPHA-THALASSEMIA GENOTYPR

## 2019-11-14 ENCOUNTER — Other Ambulatory Visit: Payer: Self-pay

## 2019-11-14 ENCOUNTER — Encounter (HOSPITAL_COMMUNITY): Payer: Self-pay | Admitting: Family Medicine

## 2019-11-14 ENCOUNTER — Telehealth: Payer: Self-pay

## 2019-11-14 ENCOUNTER — Encounter: Payer: Self-pay | Admitting: Obstetrics

## 2019-11-14 ENCOUNTER — Ambulatory Visit (INDEPENDENT_AMBULATORY_CARE_PROVIDER_SITE_OTHER): Payer: Medicaid Other

## 2019-11-14 ENCOUNTER — Inpatient Hospital Stay (HOSPITAL_COMMUNITY)
Admission: AD | Admit: 2019-11-14 | Discharge: 2019-11-14 | Disposition: A | Discharge status: Home / Self Care | Payer: Medicaid Other | Attending: Family Medicine | Admitting: Family Medicine

## 2019-11-14 VITALS — BP 92/53 | HR 68 | Wt 115.0 lb

## 2019-11-14 DIAGNOSIS — O26892 Other specified pregnancy related conditions, second trimester: Secondary | ICD-10-CM | POA: Insufficient documentation

## 2019-11-14 DIAGNOSIS — O479 False labor, unspecified: Secondary | ICD-10-CM

## 2019-11-14 DIAGNOSIS — O98512 Other viral diseases complicating pregnancy, second trimester: Secondary | ICD-10-CM | POA: Diagnosis not present

## 2019-11-14 DIAGNOSIS — O4702 False labor before 37 completed weeks of gestation, second trimester: Secondary | ICD-10-CM | POA: Diagnosis not present

## 2019-11-14 DIAGNOSIS — Z3A26 26 weeks gestation of pregnancy: Secondary | ICD-10-CM | POA: Insufficient documentation

## 2019-11-14 DIAGNOSIS — M545 Low back pain: Secondary | ICD-10-CM | POA: Insufficient documentation

## 2019-11-14 DIAGNOSIS — O2692 Pregnancy related conditions, unspecified, second trimester: Secondary | ICD-10-CM

## 2019-11-14 DIAGNOSIS — Z87891 Personal history of nicotine dependence: Secondary | ICD-10-CM | POA: Diagnosis not present

## 2019-11-14 DIAGNOSIS — M5431 Sciatica, right side: Secondary | ICD-10-CM

## 2019-11-14 DIAGNOSIS — O09212 Supervision of pregnancy with history of pre-term labor, second trimester: Secondary | ICD-10-CM

## 2019-11-14 DIAGNOSIS — M5432 Sciatica, left side: Secondary | ICD-10-CM

## 2019-11-14 DIAGNOSIS — Z8751 Personal history of pre-term labor: Secondary | ICD-10-CM

## 2019-11-14 DIAGNOSIS — O99891 Other specified diseases and conditions complicating pregnancy: Secondary | ICD-10-CM

## 2019-11-14 LAB — WET PREP, GENITAL
Clue Cells Wet Prep HPF POC: NONE SEEN
Sperm: NONE SEEN
Trich, Wet Prep: NONE SEEN
Yeast Wet Prep HPF POC: NONE SEEN

## 2019-11-14 LAB — URINALYSIS, ROUTINE W REFLEX MICROSCOPIC
Bilirubin Urine: NEGATIVE
Glucose, UA: NEGATIVE mg/dL
Hgb urine dipstick: NEGATIVE
Ketones, ur: NEGATIVE mg/dL
Leukocytes,Ua: NEGATIVE
Nitrite: NEGATIVE
Protein, ur: NEGATIVE mg/dL
Specific Gravity, Urine: 1.014 (ref 1.005–1.030)
pH: 8 (ref 5.0–8.0)

## 2019-11-14 MED ORDER — MISC. DEVICES MISC: 0 refills | Status: DC

## 2019-11-14 NOTE — Telephone Encounter (Signed)
Pt called office while at work with c/o vaginal pressure, low back pain Denies leaking, no bleeding +FM, + BH ctx's No recent intercourse, Last BM was yesterday pt states BM's are regular no constipation drinks 64 oz of water daily. Currently taking Tylenol prn no relief  Does not have support belt made aware I will order one today Pt advised to try heating pad to back on low setting pt states she already is Also noted to try a warm bath at home , change positions and rest. . Pt states she has tried numerous measures for comfort even with sitting she is having discomfort Had 17 p injection today.  Pt advised to go to MAU  Pt agreeable and voiced understanding.

## 2019-11-14 NOTE — MAU Note (Signed)
Pt presents to MAU with c/o back pain and vaginal pain that has been ongoing but today has been worse. She states that pain hurts more with movement and of the morning time. She is also reporting abdominal pain today, concerned that it is ctx. Pt denies VB and LOF. +FM

## 2019-11-14 NOTE — Progress Notes (Signed)
SUBJECTIVE OB [redacted]w[redacted]d presents for 17P injection, given in LA, tolerated well.  PLAN Next 17P in 1 week.  Administrations This Visit    HYDROXYprogesterone caproate (Makena) autoinjector 275 mg    Admin Date 11/14/2019 Action Given Dose 275 mg Route Subcutaneous Administered By Maretta Bees, RMA
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
## 2019-11-14 NOTE — Discharge Instructions (Signed)
-tylenol 1000 mg every 6-8 hours for pain -back exercises and stretches daily as mentioned below -over the counter lidocaine patches can be helpful -heating pad  Back Pain in Pregnancy Back pain during pregnancy is common. Back pain may be caused by several factors that are related to changes during your pregnancy. Follow these instructions at home: Managing pain, stiffness, and swelling      If directed, for sudden (acute) back pain, put ice on the painful area. ? Put ice in a plastic bag. ? Place a towel between your skin and the bag. ? Leave the ice on for 20 minutes, 2-3 times per day.  If directed, apply heat to the affected area before you exercise. Use the heat source that your health care provider recommends, such as a moist heat pack or a heating pad. ? Place a towel between your skin and the heat source. ? Leave the heat on for 20-30 minutes. ? Remove the heat if your skin turns bright red. This is especially important if you are unable to feel pain, heat, or cold. You may have a greater risk of getting burned.  If directed, massage the affected area. Activity  Exercise as told by your health care provider. Gentle exercise is the best way to prevent or manage back pain.  Listen to your body when lifting. If lifting hurts, ask for help or bend your knees. This uses your leg muscles instead of your back muscles.  Squat down when picking up something from the floor. Do not bend over.  Only use bed rest for short periods as told by your health care provider. Bed rest should only be used for the most severe episodes of back pain. Standing, sitting, and lying down  Do not stand in one place for long periods of time.  Use good posture when sitting. Make sure your head rests over your shoulders and is not hanging forward. Use a pillow on your lower back if necessary.  Try sleeping on your side, preferably the left side, with a pregnancy support pillow or 1-2 regular pillows  between your legs. ? If you have back pain after a night's rest, your bed may be too soft. ? A firm mattress may provide more support for your back during pregnancy. General instructions  Do not wear high heels.  Eat a healthy diet. Try to gain weight within your health care provider's recommendations.  Use a maternity girdle, elastic sling, or back brace as told by your health care provider.  Take over-the-counter and prescription medicines only as told by your health care provider.  Work with a physical therapist or massage therapist to find ways to manage back pain. Acupuncture or massage therapy may be helpful.  Keep all follow-up visits as told by your health care provider. This is important. Contact a health care provider if:  Your back pain interferes with your daily activities.  You have increasing pain in other parts of your body. Get help right away if:  You develop numbness, tingling, weakness, or problems with the use of your arms or legs.  You develop severe back pain that is not controlled with medicine.  You have a change in bowel or bladder control.  You develop shortness of breath, dizziness, or you faint.  You develop nausea, vomiting, or sweating.  You have back pain that is a rhythmic, cramping pain similar to labor pains. Labor pain is usually 1-2 minutes apart, lasts for about 1 minute, and involves a bearing down  feeling or pressure in your pelvis.  You have back pain and your water breaks or you have vaginal bleeding.  You have back pain or numbness that travels down your leg.  Your back pain developed after you fell.  You develop pain on one side of your back.  You see blood in your urine.  You develop skin blisters in the area of your back pain. Summary  Back pain may be caused by several factors that are related to changes during your pregnancy.  Follow instructions as told by your health care provider for managing pain, stiffness, and  swelling.  Exercise as told by your health care provider. Gentle exercise is the best way to prevent or manage back pain.  Take over-the-counter and prescription medicines only as told by your health care provider.  Keep all follow-up visits as told by your health care provider. This is important. This information is not intended to replace advice given to you by your health care provider. Make sure you discuss any questions you have with your health care provider. Document Revised: 07/17/2018 Document Reviewed: 09/13/2017 Elsevier Patient Education  2020 [employer] Sciatica Rehab Ask your health care provider which exercises are safe for you. Do exercises exactly as told by your health care provider and adjust them as directed. It is normal to feel mild stretching, pulling, tightness, or discomfort as you do these exercises. Stop right away if you feel sudden pain or your pain gets worse. Do not begin these exercises until told by your health care provider. Stretching and range-of-motion exercises These exercises warm up your muscles and joints and improve the movement and flexibility of your hips and back. These exercises also help to relieve pain, numbness, and tingling. Sciatic nerve glide 1. Sit in a chair with your head facing down toward your chest. Place your hands behind your back. Let your shoulders slump forward. 2. Slowly straighten one of your legs while you tilt your head back as if you are looking toward the ceiling. Only straighten your leg as far as you can without making your symptoms worse. 3. Hold this position for __________ seconds. 4. Slowly return to the starting position. 5. Repeat with your other leg. Repeat __________ times. Complete this exercise __________ times a day. Knee to chest with hip adduction and internal rotation  1. Lie on your back on a firm surface with both legs straight. 2. Bend one of your knees and move it up toward your chest until you feel a  gentle stretch in your lower back and buttock. Then, move your knee toward the shoulder that is on the opposite side from your leg. This is hip adduction and internal rotation. ? Hold your leg in this position by holding on to the front of your knee. 3. Hold this position for __________ seconds. 4. Slowly return to the starting position. 5. Repeat with your other leg. Repeat __________ times. Complete this exercise __________ times a day. Prone extension on elbows  1. Lie on your abdomen on a firm surface. A bed may be too soft for this exercise. 2. Prop yourself up on your elbows. 3. Use your arms to help lift your chest up until you feel a gentle stretch in your abdomen and your lower back. ? This will place some of your body weight on your elbows. If this is uncomfortable, try stacking pillows under your chest. ? Your hips should stay down, against the surface that you are lying on. Keep your hip  and back muscles relaxed. 4. Hold this position for __________ seconds. 5. Slowly relax your upper body and return to the starting position. Repeat __________ times. Complete this exercise __________ times a day. Strengthening exercises These exercises build strength and endurance in your back. Endurance is the ability to use your muscles for a long time, even after they get tired. Pelvic tilt This exercise strengthens the muscles that lie deep in the abdomen. 1. Lie on your back on a firm surface. Bend your knees and keep your feet flat on the floor. 2. Tense your abdominal muscles. Tip your pelvis up toward the ceiling and flatten your lower back into the floor. ? To help with this exercise, you may place a small towel under your lower back and try to push your back into the towel. 3. Hold this position for __________ seconds. 4. Let your muscles relax completely before you repeat this exercise. Repeat __________ times. Complete this exercise __________ times a day. Alternating arm and leg  raises  1. Get on your hands and knees on a firm surface. If you are on a hard floor, you may want to use padding, such as an exercise mat, to cushion your knees. 2. Line up your arms and legs. Your hands should be directly below your shoulders, and your knees should be directly below your hips. 3. Lift your left leg behind you. At the same time, raise your right arm and straighten it in front of you. ? Do not lift your leg higher than your hip. ? Do not lift your arm higher than your shoulder. ? Keep your abdominal and back muscles tight. ? Keep your hips facing the ground. ? Do not arch your back. ? Keep your balance carefully, and do not hold your breath. 4. Hold this position for __________ seconds. 5. Slowly return to the starting position. 6. Repeat with your right leg and your left arm. Repeat __________ times. Complete this exercise __________ times a day. Posture and body mechanics Good posture and healthy body mechanics can help to relieve stress in your body's tissues and joints. Body mechanics refers to the movements and positions of your body while you do your daily activities. Posture is part of body mechanics. Good posture means:  Your spine is in its natural S-curve position (neutral).  Your shoulders are pulled back slightly.  Your head is not tipped forward. Follow these guidelines to improve your posture and body mechanics in your everyday activities. Standing   When standing, keep your spine neutral and your feet about hip width apart. Keep a slight bend in your knees. Your ears, shoulders, and hips should line up.  When you do a task in which you stand in one place for a long time, place one foot up on a stable object that is 2-4 inches (5-10 cm) high, such as a footstool. This helps keep your spine neutral. Sitting   When sitting, keep your spine neutral and keep your feet flat on the floor. Use a footrest, if necessary, and keep your thighs parallel to the  floor. Avoid rounding your shoulders, and avoid tilting your head forward.  When working at a desk or a computer, keep your desk at a height where your hands are slightly lower than your elbows. Slide your chair under your desk so you are close enough to maintain good posture.  When working at a computer, place your monitor at a height where you are looking straight ahead and you do not have  to tilt your head forward or downward to look at the screen. Resting  When lying down and resting, avoid positions that are most painful for you.  If you have pain with activities such as sitting, bending, stooping, or squatting, lie in a position in which your body does not bend very much. For example, avoid curling up on your side with your arms and knees near your chest (fetal position).  If you have pain with activities such as standing for a long time or reaching with your arms, lie with your spine in a neutral position and bend your knees slightly. Try the following positions: ? Lying on your side with a pillow between your knees. ? Lying on your back with a pillow under your knees. Lifting   When lifting objects, keep your feet at least shoulder width apart and tighten your abdominal muscles.  Bend your knees and hips and keep your spine neutral. It is important to lift using the strength of your legs, not your back. Do not lock your knees straight out.  Always ask for help to lift heavy or awkward objects. This information is not intended to replace advice given to you by your health care provider. Make sure you discuss any questions you have with your health care provider. Document Revised: 07/20/2018 Document Reviewed: 04/19/2018 Elsevier Patient Education  2020 [employer]

## 2019-11-15 LAB — GC/CHLAMYDIA PROBE AMP (~~LOC~~) NOT AT ARMC
Chlamydia: NEGATIVE
Comment: NEGATIVE
Comment: NORMAL
Neisseria Gonorrhea: NEGATIVE

## 2019-11-16 NOTE — Progress Notes (Signed)
Patient ID: Haley Lopez, female   DOB: Dec 10, 1995, 24 y.o.   MRN: [id number] Patient was assessed and managed by nursing staff during this encounter. I have reviewed the chart and agree with the documentation and plan. I have also made any necessary editorial changes.  Scheryl Darter, MD 11/16/2019 6:51 PM

## 2019-11-19 ENCOUNTER — Telehealth: Payer: Self-pay | Admitting: Genetic Counselor

## 2019-11-19 NOTE — Telephone Encounter (Signed)
I received a call from Haley Lopez this morning wondering if it would still be possible to do alpha-thalassemia carrier screening for the father of the baby. She understands that amniocentesis demonstrated that the current fetus is not affected by alpha-thalassemia; however, partner carrier screening could be beneficial to determine if any future pregnancies between Haley Lopez and the father of this fetus are at risk for alpha-thalassemia. Haley Lopez was not sure whether or not the father of the pregnancy has health insurance. I informed Haley Lopez that if he does have insurance, I can perform a benefits investigation to determine the expected cost of testing prior to ordering it. If he does not have health insurance, I can potentially facilitate free testing for him through the laboratory [facility]'s Patient Assistance Program. She is going to discuss carrier screening with the father of the pregnancy and let me know.  Haley Lopez also inquired about paternity testing. Specifically, she asked if we perform noninvasive paternity testing here and what the cost would be. I informed Haley Lopez that we do not perform prenatal paternity testing here. We discussed that paternity testing can cost thousands of dollars when performed prenatally and that postnatal testing is much less expensive. Both [facility] and [facility] offer postnatal paternity testing. Home paternity testing starts at $199 and legal paternity testing starts at $499 through [facility]. I sent her links to both of these websites via email.  I informed Haley Lopez that partner carrier screening for alpha-thalassemia would not be able to tell us who the father of the current fetus is. The main benefit for pursuing partner carrier screeing for alpha-thalassemia would be for risk assessment for future pregnancies. Haley Lopez understood this limitation and still expressed interested in pursuing partner carrier screening. I will await further information  from her and help facilitate testing if desired from there.  Gershon Crane, MS, [facility] Genetic Counselor

## 2019-11-20 ENCOUNTER — Encounter: Payer: Self-pay | Admitting: Obstetrics & Gynecology

## 2019-11-20 ENCOUNTER — Ambulatory Visit (INDEPENDENT_AMBULATORY_CARE_PROVIDER_SITE_OTHER): Payer: Medicaid Other | Admitting: Obstetrics & Gynecology

## 2019-11-20 ENCOUNTER — Other Ambulatory Visit: Payer: Self-pay

## 2019-11-20 ENCOUNTER — Other Ambulatory Visit: Payer: Medicaid Other

## 2019-11-20 VITALS — BP 104/64 | HR 62 | Wt 116.2 lb

## 2019-11-20 DIAGNOSIS — M549 Dorsalgia, unspecified: Secondary | ICD-10-CM

## 2019-11-20 DIAGNOSIS — Z3A28 28 weeks gestation of pregnancy: Secondary | ICD-10-CM

## 2019-11-20 DIAGNOSIS — D509 Iron deficiency anemia, unspecified: Secondary | ICD-10-CM

## 2019-11-20 DIAGNOSIS — Z3A27 27 weeks gestation of pregnancy: Secondary | ICD-10-CM

## 2019-11-20 DIAGNOSIS — O0992 Supervision of high risk pregnancy, unspecified, second trimester: Secondary | ICD-10-CM

## 2019-11-20 DIAGNOSIS — Z8751 Personal history of pre-term labor: Secondary | ICD-10-CM

## 2019-11-20 DIAGNOSIS — O99013 Anemia complicating pregnancy, third trimester: Secondary | ICD-10-CM

## 2019-11-20 DIAGNOSIS — O99891 Other specified diseases and conditions complicating pregnancy: Secondary | ICD-10-CM

## 2019-11-20 NOTE — Patient Instructions (Signed)
Return to office for any scheduled appointments. Call the office or go to the MAU at [facility] if:  You begin to have strong, frequent contractions  Your water breaks.  Sometimes it is a big gush of fluid, sometimes it is just a trickle that keeps getting your panties wet or running down your legs  You have vaginal bleeding.  It is normal to have a small amount of spotting if your cervix was checked.   You do not feel your baby moving like normal.  If you do not, get something to eat and drink and lay down and focus on feeling your baby move.   If your baby is still not moving like normal, you should call the office or go to MAU.  Any other obstetric concerns.   Third Trimester of Pregnancy The third trimester is from week 28 through week 40 (months 7 through 9). The third trimester is a time when the unborn baby (fetus) is growing rapidly. At the end of the ninth month, the fetus is about 20 inches in length and weighs 6-10 pounds. Body changes during your third trimester Your body will continue to go through many changes during pregnancy. The changes vary from woman to woman. During the third trimester:  Your weight will continue to increase. You can expect to gain 25-35 pounds (11-16 kg) by the end of the pregnancy.  You may begin to get stretch marks on your hips, abdomen, and breasts.  You may urinate more often because the fetus is moving lower into your pelvis and pressing on your bladder.  You may develop or continue to have heartburn. This is caused by increased hormones that slow down muscles in the digestive tract.  You may develop or continue to have constipation because increased hormones slow digestion and cause the muscles that push waste through your intestines to relax.  You may develop hemorrhoids. These are swollen veins (varicose veins) in the rectum that can itch or be painful.  You may develop swollen, bulging veins (varicose veins)  in your legs.  You may have increased body aches in the pelvis, back, or thighs. This is due to weight gain and increased hormones that are relaxing your joints.  You may have changes in your hair. These can include thickening of your hair, rapid growth, and changes in texture. Some women also have hair loss during or after pregnancy, or hair that feels dry or thin. Your hair will most likely return to normal after your baby is born.  Your breasts will continue to grow and they will continue to become tender. A yellow fluid (colostrum) may leak from your breasts. This is the first milk you are producing for your baby.  Your belly button may stick out.  You may notice more swelling in your hands, face, or ankles.  You may have increased tingling or numbness in your hands, arms, and legs. The skin on your belly may also feel numb.  You may feel short of breath because of your expanding uterus.  You may have more problems sleeping. This can be caused by the size of your belly, increased need to urinate, and an increase in your body's metabolism.  You may notice the fetus "dropping," or moving lower in your abdomen (lightening).  You may have increased vaginal discharge.  You may notice your joints feel loose and you may have pain around your pelvic bone. What to expect at prenatal visits You will have   prenatal exams every 2 weeks until week 36. Then you will have weekly prenatal exams. During a routine prenatal visit:  You will be weighed to make sure you and the baby are growing normally.  Your blood pressure will be taken.  Your abdomen will be measured to track your baby's growth.  The fetal heartbeat will be listened to.  Any test results from the previous visit will be discussed.  You may have a cervical check near your due date to see if your cervix has softened or thinned (effaced).  You will be tested for Group B streptococcus. This happens between 35 and 37 weeks. Your  health care provider may ask you:  What your birth plan is.  How you are feeling.  If you are feeling the baby move.  If you have had any abnormal symptoms, such as leaking fluid, bleeding, severe headaches, or abdominal cramping.  If you are using any tobacco products, including cigarettes, chewing tobacco, and electronic cigarettes.  If you have any questions. Other tests or screenings that may be performed during your third trimester include:  Blood tests that check for low iron levels (anemia).  Fetal testing to check the health, activity level, and growth of the fetus. Testing is done if you have certain medical conditions or if there are problems during the pregnancy.  Nonstress test (NST). This test checks the health of your baby to make sure there are no signs of problems, such as the baby not getting enough oxygen. During this test, a belt is placed around your belly. The baby is made to move, and its heart rate is monitored during movement. What is false labor? False labor is a condition in which you feel small, irregular tightenings of the muscles in the womb (contractions) that usually go away with rest, changing position, or drinking water. These are called Braxton Hicks contractions. Contractions may last for hours, days, or even weeks before true labor sets in. If contractions come at regular intervals, become more frequent, increase in intensity, or become painful, you should see your health care provider. What are the signs of labor?  Abdominal cramps.  Regular contractions that start at 10 minutes apart and become stronger and more frequent with time.  Contractions that start on the top of the uterus and spread down to the lower abdomen and back.  Increased pelvic pressure and dull back pain.  A watery or bloody mucus discharge that comes from the vagina.  Leaking of amniotic fluid. This is also known as your "water breaking." It could be a slow trickle or a gush.  Let your health care provider know if it has a color or strange odor. If you have any of these signs, call your health care provider right away, even if it is before your due date. Follow these instructions at home: Medicines  Follow your health care provider's instructions regarding medicine use. Specific medicines may be either safe or unsafe to take during pregnancy.  Take a prenatal vitamin that contains at least 600 micrograms (mcg) of folic acid.  If you develop constipation, try taking a stool softener if your health care provider approves. Eating and drinking   Eat a balanced diet that includes fresh fruits and vegetables, whole grains, good sources of protein such as meat, eggs, or tofu, and low-fat dairy. Your health care provider will help you determine the amount of weight gain that is right for you.  Avoid raw meat and uncooked cheese. These carry germs that   can cause birth defects in the baby.  If you have low calcium intake from food, talk to your health care provider about whether you should take a daily calcium supplement.  Eat four or five small meals rather than three large meals a day.  Limit foods that are high in fat and processed sugars, such as fried and sweet foods.  To prevent constipation: ? Drink enough fluid to keep your urine clear or pale yellow. ? Eat foods that are high in fiber, such as fresh fruits and vegetables, whole grains, and beans. Activity  Exercise only as directed by your health care provider. Most women can continue their usual exercise routine during pregnancy. Try to exercise for 30 minutes at least 5 days a week. Stop exercising if you experience uterine contractions.  Avoid heavy lifting.  Do not exercise in extreme heat or humidity, or at high altitudes.  Wear low-heel, comfortable shoes.  Practice good posture.  You may continue to have sex unless your health care provider tells you otherwise. Relieving pain and  discomfort  Take frequent breaks and rest with your legs elevated if you have leg cramps or low back pain.  Take warm sitz baths to soothe any pain or discomfort caused by hemorrhoids. Use hemorrhoid cream if your health care provider approves.  Wear a good support bra to prevent discomfort from breast tenderness.  If you develop varicose veins: ? Wear support pantyhose or compression stockings as told by your healthcare provider. ? Elevate your feet for 15 minutes, 3-4 times a day. Prenatal care  Write down your questions. Take them to your prenatal visits.  Keep all your prenatal visits as told by your health care provider. This is important. Safety  Wear your seat belt at all times when driving.  Make a list of emergency phone numbers, including numbers for family, friends, the hospital, and police and fire departments. General instructions  Avoid cat litter boxes and soil used by cats. These carry germs that can cause birth defects in the baby. If you have a cat, ask someone to clean the litter box for you.  Do not travel far distances unless it is absolutely necessary and only with the approval of your health care provider.  Do not use hot tubs, steam rooms, or saunas.  Do not drink alcohol.  Do not use any products that contain nicotine or tobacco, such as cigarettes and e-cigarettes. If you need help quitting, ask your health care provider.  Do not use any medicinal herbs or unprescribed drugs. These chemicals affect the formation and growth of the baby.  Do not douche or use tampons or scented sanitary pads.  Do not cross your legs for long periods of time.  To prepare for the arrival of your baby: ? Take prenatal classes to understand, practice, and ask questions about labor and delivery. ? Make a trial run to the hospital. ? Visit the hospital and tour the maternity area. ? Arrange for maternity or paternity leave through employers. ? Arrange for family and  friends to take care of pets while you are in the hospital. ? Purchase a rear-facing car seat and make sure you know how to install it in your car. ? Pack your hospital bag. ? Prepare the baby's nursery. Make sure to remove all pillows and stuffed animals from the baby's crib to prevent suffocation.  Visit your dentist if you have not gone during your pregnancy. Use a soft toothbrush to brush your teeth and be   gentle when you floss. Contact a health care provider if:  You are unsure if you are in labor or if your water has broken.  You become dizzy.  You have mild pelvic cramps, pelvic pressure, or nagging pain in your abdominal area.  You have lower back pain.  You have persistent nausea, vomiting, or diarrhea.  You have an unusual or bad smelling vaginal discharge.  You have pain when you urinate. Get help right away if:  Your water breaks before 37 weeks.  You have regular contractions less than 5 minutes apart before 37 weeks.  You have a fever.  You are leaking fluid from your vagina.  You have spotting or bleeding from your vagina.  You have severe abdominal pain or cramping.  You have rapid weight loss or weight gain.  You have shortness of breath with chest pain.  You notice sudden or extreme swelling of your face, hands, ankles, feet, or legs.  Your baby makes fewer than 10 movements in 2 hours.  You have severe headaches that do not go away when you take medicine.  You have vision changes. Summary  The third trimester is from week 28 through week 40, months 7 through 9. The third trimester is a time when the unborn baby (fetus) is growing rapidly.  During the third trimester, your discomfort may increase as you and your baby continue to gain weight. You may have abdominal, leg, and back pain, sleeping problems, and an increased need to urinate.  During the third trimester your breasts will keep growing and they will continue to become tender. A yellow  fluid (colostrum) may leak from your breasts. This is the first milk you are producing for your baby.  False labor is a condition in which you feel small, irregular tightenings of the muscles in the womb (contractions) that eventually go away. These are called Braxton Hicks contractions. Contractions may last for hours, days, or even weeks before true labor sets in.  Signs of labor can include: abdominal cramps; regular contractions that start at 10 minutes apart and become stronger and more frequent with time; watery or bloody mucus discharge that comes from the vagina; increased pelvic pressure and dull back pain; and leaking of amniotic fluid. This information is not intended to replace advice given to you by your health care provider. Make sure you discuss any questions you have with your health care provider. Document Revised: 07/19/2018 Document Reviewed: 05/03/2016 Elsevier Patient Education  2020 [employer]  

## 2019-11-20 NOTE — Progress Notes (Signed)
Patient presents for ROB and GTT. Patient complains of having pain in her pelvic area and lower back. Patient has no other concerns. She declines the TDAP vaccine today.  Patient given 17p today in right arm. Patient tolerated well.
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
## 2019-11-21 ENCOUNTER — Encounter: Payer: Self-pay | Admitting: Obstetrics & Gynecology

## 2019-11-21 ENCOUNTER — Telehealth: Payer: Self-pay

## 2019-11-21 DIAGNOSIS — D509 Iron deficiency anemia, unspecified: Secondary | ICD-10-CM | POA: Insufficient documentation

## 2019-11-21 DIAGNOSIS — O99013 Anemia complicating pregnancy, third trimester: Secondary | ICD-10-CM | POA: Insufficient documentation

## 2019-11-21 LAB — CBC
Hematocrit: 30.2 % — ABNORMAL LOW (ref 34.0–46.6)
Hemoglobin: 9.5 g/dL — ABNORMAL LOW (ref 11.1–15.9)
MCH: 27.5 pg (ref 26.6–33.0)
MCHC: 31.5 g/dL (ref 31.5–35.7)
MCV: 88 fL (ref 79–97)
Platelets: 257 10*3/uL (ref 150–450)
RBC: 3.45 x10E6/uL — ABNORMAL LOW (ref 3.77–5.28)
RDW: 12.4 % (ref 11.7–15.4)
WBC: 6.2 10*3/uL (ref 3.4–10.8)

## 2019-11-21 LAB — GLUCOSE TOLERANCE, 2 HOURS W/ 1HR
Glucose, 1 hour: 66 mg/dL (ref 65–179)
Glucose, 2 hour: 89 mg/dL (ref 65–152)
Glucose, Fasting: 63 mg/dL — ABNORMAL LOW (ref 65–91)

## 2019-11-21 LAB — RPR: RPR Ser Ql: NONREACTIVE

## 2019-11-21 LAB — HIV ANTIBODY (ROUTINE TESTING W REFLEX): HIV Screen 4th Generation wRfx: NONREACTIVE

## 2019-11-21 NOTE — Telephone Encounter (Signed)
S/w pt and advised of results and scheduler will call for appt

## 2019-11-21 NOTE — Telephone Encounter (Signed)
Called to advise of results and need to schedule iron infusion, no answer, left vm

## 2019-11-21 NOTE — Progress Notes (Signed)
Patient ID: Haley Lopez, female   DOB: November 20, 1995, 24 y.o.   MRN: [id number] Patient was assessed and managed by nursing staff during this encounter. I have reviewed the chart and agree with the documentation and plan. I have also made any necessary editorial changes.  Scheryl Darter, MD 11/21/2019 3:21 PM

## 2019-11-21 NOTE — Addendum Note (Signed)
Addended by: Jaynie Collins A on: 11/21/2019 12:42 PM   Modules accepted: Orders

## 2019-11-26 NOTE — Discharge Instructions (Signed)

## 2019-11-27 ENCOUNTER — Other Ambulatory Visit: Payer: Self-pay

## 2019-11-27 ENCOUNTER — Other Ambulatory Visit: Payer: Self-pay | Admitting: Obstetrics and Gynecology

## 2019-11-27 ENCOUNTER — Telehealth: Payer: Self-pay

## 2019-11-27 ENCOUNTER — Ambulatory Visit (HOSPITAL_COMMUNITY)
Admission: RE | Admit: 2019-11-27 | Discharge: 2019-11-27 | Disposition: A | Discharge status: Home / Self Care | Payer: Medicaid Other | Source: Ambulatory Visit | Attending: Obstetrics & Gynecology | Admitting: Obstetrics & Gynecology

## 2019-11-27 ENCOUNTER — Ambulatory Visit (INDEPENDENT_AMBULATORY_CARE_PROVIDER_SITE_OTHER): Payer: Medicaid Other | Admitting: *Deleted

## 2019-11-27 VITALS — Wt 120.0 lb

## 2019-11-27 DIAGNOSIS — O99013 Anemia complicating pregnancy, third trimester: Secondary | ICD-10-CM | POA: Diagnosis not present

## 2019-11-27 DIAGNOSIS — Z8751 Personal history of pre-term labor: Secondary | ICD-10-CM

## 2019-11-27 DIAGNOSIS — Z3A28 28 weeks gestation of pregnancy: Secondary | ICD-10-CM | POA: Diagnosis present

## 2019-11-27 DIAGNOSIS — D509 Iron deficiency anemia, unspecified: Secondary | ICD-10-CM | POA: Insufficient documentation

## 2019-11-27 DIAGNOSIS — O0990 Supervision of high risk pregnancy, unspecified, unspecified trimester: Secondary | ICD-10-CM

## 2019-11-27 MED ORDER — SODIUM CHLORIDE 0.9 % IV SOLN
510.0000 mg | INTRAVENOUS | Status: DC
  Administered 2019-11-27: 510 mg via INTRAVENOUS
  Filled 2019-11-27: qty 17

## 2019-11-27 MED ORDER — FERROUS SULFATE 325 (65 FE) MG PO TABS: 325.0000 mg | ORAL_TABLET | Freq: Two times a day (BID) | ORAL | 1 refills | Status: DC

## 2019-11-27 NOTE — Progress Notes (Signed)
Pt is in office for 17p injection.  Pt tolerated injection well in left arm.  Pt has no other concerns today.   Pt is scheduled for Feraheme inj today.  Administrations This Visit    HYDROXYprogesterone caproate (Makena) autoinjector 275 mg    Admin Date 11/27/2019 Action Given Dose 275 mg Route Subcutaneous Administered By Lanney Gins, CMA

## 2019-11-27 NOTE — Progress Notes (Addendum)
Called by infusion center RN regarding feraheme reaction. Approximately 3 minutes into infusion pt complained of flushing, chest tightness, and sensation of shortness of breath. No rash, nausea, emesis. feraheme stopped. Vitals wnl and stable at the time and on repeat. Symptoms resolved after 5 minutes and 1 L NS was infused; this all occurred prior to my notification. At time of my exam approximately 30 minutes after reaction, patient resting comfortably and feeling normal. Lungs CTAB. Clinical assessment is that this is a moderate hypersensitivity reaction. RN added this to allergy list. Will plan to monitor an additional 30 min and then discharge home. Anaphylaxis precautions carefully reviewed w/ patient. Has outpatient OB f/u next week.

## 2019-11-27 NOTE — Progress Notes (Signed)
Pt here for first Feraheme infusion.  Med discussed with pt and infusion begun.  Three minutes into the infusion patient started complaining of inability to breath and nausea.  Med stopped, O2 applied, bolus of NS given and within 5 minutes pt was feeling better. VS remained stable. Feraheme allergy added to chart.  WIll continue to monitor until discharge.  MD notified

## 2019-11-27 NOTE — Telephone Encounter (Signed)
S/w pt and she stated that she had an allergic reaction during iron infusion today, advised that provider sent iron tablets to pharmacy today and they should be taken twice a day in addition to PNV.

## 2019-12-04 ENCOUNTER — Encounter: Payer: Self-pay | Admitting: Obstetrics and Gynecology

## 2019-12-04 ENCOUNTER — Other Ambulatory Visit: Payer: Self-pay

## 2019-12-04 ENCOUNTER — Ambulatory Visit (INDEPENDENT_AMBULATORY_CARE_PROVIDER_SITE_OTHER): Payer: Medicaid Other | Admitting: Obstetrics and Gynecology

## 2019-12-04 ENCOUNTER — Encounter (HOSPITAL_COMMUNITY): Payer: Medicaid Other

## 2019-12-04 VITALS — BP 121/83 | HR 66 | Wt 114.9 lb

## 2019-12-04 DIAGNOSIS — Z8751 Personal history of pre-term labor: Secondary | ICD-10-CM | POA: Diagnosis not present

## 2019-12-04 DIAGNOSIS — B009 Herpesviral infection, unspecified: Secondary | ICD-10-CM

## 2019-12-04 DIAGNOSIS — O99013 Anemia complicating pregnancy, third trimester: Secondary | ICD-10-CM

## 2019-12-04 DIAGNOSIS — D509 Iron deficiency anemia, unspecified: Secondary | ICD-10-CM

## 2019-12-04 DIAGNOSIS — O0993 Supervision of high risk pregnancy, unspecified, third trimester: Secondary | ICD-10-CM

## 2019-12-04 DIAGNOSIS — Z8659 Personal history of other mental and behavioral disorders: Secondary | ICD-10-CM

## 2019-12-04 DIAGNOSIS — Z8759 Personal history of other complications of pregnancy, childbirth and the puerperium: Secondary | ICD-10-CM

## 2019-12-04 MED ORDER — HYDROXYPROGESTERONE CAPROATE 275 MG/1.1ML ~~LOC~~ SOAJ
275.0000 mg | Freq: Once | SUBCUTANEOUS | Status: AC
  Administered 2019-12-04: 275 mg via SUBCUTANEOUS

## 2019-12-04 NOTE — Addendum Note (Signed)
Addended by: Eldred Manges on: 12/04/2019 02:50 PM   Modules accepted: Orders
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
## 2019-12-11 ENCOUNTER — Other Ambulatory Visit: Payer: Self-pay

## 2019-12-11 ENCOUNTER — Ambulatory Visit (INDEPENDENT_AMBULATORY_CARE_PROVIDER_SITE_OTHER): Payer: Medicaid Other

## 2019-12-11 DIAGNOSIS — Z8751 Personal history of pre-term labor: Secondary | ICD-10-CM

## 2019-12-11 DIAGNOSIS — Z3A31 31 weeks gestation of pregnancy: Secondary | ICD-10-CM

## 2019-12-11 DIAGNOSIS — O09213 Supervision of pregnancy with history of pre-term labor, third trimester: Secondary | ICD-10-CM

## 2019-12-11 NOTE — Progress Notes (Signed)
Pt is in the office for 17-p injection, administered in L arm and pt tolerated well. .. Administrations This Visit    HYDROXYprogesterone caproate (Makena) autoinjector 275 mg    Admin Date 12/11/2019 Action Given Dose 275 mg Route Subcutaneous Administered By [name], RN

## 2019-12-11 NOTE — Progress Notes (Signed)
I have reviewed this chart and agree with the RN/CMA assessment and management.    K. Meryl Benjamine Strout, M.D. Attending [facility] (Faculty Practice)   

## 2019-12-18 ENCOUNTER — Ambulatory Visit (INDEPENDENT_AMBULATORY_CARE_PROVIDER_SITE_OTHER): Payer: Medicaid Other | Admitting: Obstetrics and Gynecology

## 2019-12-18 ENCOUNTER — Other Ambulatory Visit: Payer: Self-pay

## 2019-12-18 VITALS — BP 108/73 | HR 89 | Wt 119.2 lb

## 2019-12-18 DIAGNOSIS — D509 Iron deficiency anemia, unspecified: Secondary | ICD-10-CM

## 2019-12-18 DIAGNOSIS — Z8751 Personal history of pre-term labor: Secondary | ICD-10-CM

## 2019-12-18 DIAGNOSIS — O0993 Supervision of high risk pregnancy, unspecified, third trimester: Secondary | ICD-10-CM

## 2019-12-18 DIAGNOSIS — B009 Herpesviral infection, unspecified: Secondary | ICD-10-CM

## 2019-12-18 DIAGNOSIS — O99013 Anemia complicating pregnancy, third trimester: Secondary | ICD-10-CM
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
## 2019-12-18 NOTE — Progress Notes (Signed)
Pt is here for ROB, [redacted]w[redacted]d.

## 2019-12-25 ENCOUNTER — Ambulatory Visit: Payer: Medicaid Other

## 2019-12-25 ENCOUNTER — Other Ambulatory Visit: Payer: Self-pay

## 2019-12-25 ENCOUNTER — Ambulatory Visit (INDEPENDENT_AMBULATORY_CARE_PROVIDER_SITE_OTHER): Payer: Medicaid Other

## 2019-12-25 DIAGNOSIS — Z8751 Personal history of pre-term labor: Secondary | ICD-10-CM | POA: Diagnosis not present

## 2019-12-25 MED ORDER — HYDROXYPROGESTERONE CAPROATE 275 MG/1.1ML ~~LOC~~ SOAJ
275.0000 mg | Freq: Once | SUBCUTANEOUS | Status: AC
  Administered 2019-12-25: 275 mg via SUBCUTANEOUS

## 2019-12-25 NOTE — Progress Notes (Signed)
ROB [redacted]w[redacted]d  presents for 17 P injection.  Pt tolerated injection well, administered in L- Arm.`  Pt advised to keep scheduled Appt.  Pt agreeable and voiced understanding.

## 2019-12-25 NOTE — Progress Notes (Signed)
I have reviewed this chart and agree with the RN/CMA assessment and management.    K. Meryl Astoria Condon, M.D. Attending [facility] (Faculty Practice)   

## 2019-12-30 ENCOUNTER — Encounter: Payer: Self-pay | Admitting: Obstetrics & Gynecology

## 2019-12-30 ENCOUNTER — Other Ambulatory Visit: Payer: Self-pay

## 2019-12-30 ENCOUNTER — Ambulatory Visit (INDEPENDENT_AMBULATORY_CARE_PROVIDER_SITE_OTHER): Payer: Medicaid Other | Admitting: Obstetrics & Gynecology

## 2019-12-30 VITALS — BP 112/73 | HR 90 | Wt 121.0 lb

## 2019-12-30 DIAGNOSIS — O0993 Supervision of high risk pregnancy, unspecified, third trimester: Secondary | ICD-10-CM

## 2019-12-30 DIAGNOSIS — Z8751 Personal history of pre-term labor: Secondary | ICD-10-CM | POA: Diagnosis not present

## 2019-12-30 DIAGNOSIS — Z3A33 33 weeks gestation of pregnancy: Secondary | ICD-10-CM

## 2019-12-30 DIAGNOSIS — Z8759 Personal history of other complications of pregnancy, childbirth and the puerperium: Secondary | ICD-10-CM
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
## 2019-12-30 NOTE — Progress Notes (Signed)
Pt presents for ROB without complaints today.  Mucous plug expelled 2 days ago - denies VB, LOF, cramping; pt reqs cx check.  Makena given R arm w/o difficulty  Administrations This Visit    HYDROXYprogesterone caproate (Makena) autoinjector 275 mg    Admin Date 12/30/2019 Action Given Dose 275 mg Route Subcutaneous Administered By Lewayne Bunting, CMA

## 2019-12-30 NOTE — Patient Instructions (Signed)

## 2020-01-01 ENCOUNTER — Encounter: Payer: Medicaid Other | Admitting: Obstetrics and Gynecology

## 2020-01-01 ENCOUNTER — Encounter: Payer: Self-pay | Admitting: Obstetrics

## 2020-01-07 ENCOUNTER — Ambulatory Visit (INDEPENDENT_AMBULATORY_CARE_PROVIDER_SITE_OTHER): Payer: Medicaid Other | Admitting: Obstetrics and Gynecology

## 2020-01-07 ENCOUNTER — Encounter: Payer: Self-pay | Admitting: Obstetrics and Gynecology

## 2020-01-07 ENCOUNTER — Encounter: Payer: Self-pay | Admitting: Obstetrics

## 2020-01-07 ENCOUNTER — Other Ambulatory Visit: Payer: Self-pay

## 2020-01-07 VITALS — BP 109/71 | HR 88 | Wt 121.8 lb

## 2020-01-07 DIAGNOSIS — D509 Iron deficiency anemia, unspecified: Secondary | ICD-10-CM

## 2020-01-07 DIAGNOSIS — Z8759 Personal history of other complications of pregnancy, childbirth and the puerperium: Secondary | ICD-10-CM

## 2020-01-07 DIAGNOSIS — B009 Herpesviral infection, unspecified: Secondary | ICD-10-CM

## 2020-01-07 DIAGNOSIS — O99013 Anemia complicating pregnancy, third trimester: Secondary | ICD-10-CM

## 2020-01-07 DIAGNOSIS — O0993 Supervision of high risk pregnancy, unspecified, third trimester: Secondary | ICD-10-CM

## 2020-01-07 DIAGNOSIS — D563 Thalassemia minor: Secondary | ICD-10-CM

## 2020-01-07 DIAGNOSIS — F419 Anxiety disorder, unspecified: Secondary | ICD-10-CM

## 2020-01-07 DIAGNOSIS — Z8659 Personal history of other mental and behavioral disorders: Secondary | ICD-10-CM

## 2020-01-07 DIAGNOSIS — Z8751 Personal history of pre-term labor: Secondary | ICD-10-CM | POA: Diagnosis not present

## 2020-01-07 MED ORDER — VALACYCLOVIR HCL 500 MG PO TABS: 500.0000 mg | ORAL_TABLET | Freq: Every day | ORAL | 0 refills | Status: DC

## 2020-01-07 NOTE — Progress Notes (Signed)
Patient reports fetal movement, denies pain. Administered 17-p in L arm and pt tolerated well .Marland Kitchen Administrations This Visit    HYDROXYprogesterone caproate (Makena) autoinjector 275 mg    Admin Date 01/07/2020 Action Given Dose 275 mg Route Subcutaneous Administered By [name], RN

## 2020-01-07 NOTE — Patient Instructions (Signed)
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
## 2020-01-09 ENCOUNTER — Other Ambulatory Visit: Payer: Self-pay | Admitting: Obstetrics & Gynecology

## 2020-01-09 ENCOUNTER — Ambulatory Visit: Payer: Medicaid Other | Attending: Obstetrics & Gynecology

## 2020-01-09 ENCOUNTER — Other Ambulatory Visit: Payer: Self-pay | Admitting: *Deleted

## 2020-01-09 ENCOUNTER — Other Ambulatory Visit: Payer: Self-pay

## 2020-01-09 ENCOUNTER — Ambulatory Visit: Payer: Medicaid Other | Admitting: *Deleted

## 2020-01-09 DIAGNOSIS — O0993 Supervision of high risk pregnancy, unspecified, third trimester: Secondary | ICD-10-CM | POA: Insufficient documentation

## 2020-01-09 DIAGNOSIS — Z3A33 33 weeks gestation of pregnancy: Secondary | ICD-10-CM | POA: Diagnosis not present

## 2020-01-09 DIAGNOSIS — O365921 Maternal care for other known or suspected poor fetal growth, second trimester, fetus 1: Secondary | ICD-10-CM

## 2020-01-09 DIAGNOSIS — O365990 Maternal care for other known or suspected poor fetal growth, unspecified trimester, not applicable or unspecified: Secondary | ICD-10-CM

## 2020-01-09 DIAGNOSIS — O99013 Anemia complicating pregnancy, third trimester: Secondary | ICD-10-CM | POA: Insufficient documentation

## 2020-01-09 DIAGNOSIS — D509 Iron deficiency anemia, unspecified: Secondary | ICD-10-CM | POA: Diagnosis present

## 2020-01-09 DIAGNOSIS — Z8751 Personal history of pre-term labor: Secondary | ICD-10-CM

## 2020-01-10 ENCOUNTER — Encounter (HOSPITAL_COMMUNITY): Payer: Self-pay | Admitting: Obstetrics and Gynecology

## 2020-01-10 ENCOUNTER — Inpatient Hospital Stay (HOSPITAL_COMMUNITY)
Admission: AD | Admit: 2020-01-10 | Discharge: 2020-01-11 | Disposition: A | Discharge status: Home / Self Care | Payer: Medicaid Other | Attending: Obstetrics and Gynecology | Admitting: Obstetrics and Gynecology

## 2020-01-10 DIAGNOSIS — Z3A35 35 weeks gestation of pregnancy: Secondary | ICD-10-CM | POA: Insufficient documentation

## 2020-01-10 DIAGNOSIS — Z87891 Personal history of nicotine dependence: Secondary | ICD-10-CM | POA: Diagnosis not present

## 2020-01-10 DIAGNOSIS — Z0371 Encounter for suspected problem with amniotic cavity and membrane ruled out: Secondary | ICD-10-CM

## 2020-01-10 DIAGNOSIS — O4703 False labor before 37 completed weeks of gestation, third trimester: Secondary | ICD-10-CM | POA: Insufficient documentation

## 2020-01-10 LAB — AMNISURE RUPTURE OF MEMBRANE (ROM) NOT AT ARMC: Amnisure ROM: NEGATIVE

## 2020-01-10 MED ORDER — NIFEDIPINE 10 MG PO CAPS
10.0000 mg | ORAL_CAPSULE | ORAL | Status: AC | PRN
  Administered 2020-01-10 – 2020-01-11 (×3): 10 mg via ORAL
  Filled 2020-01-10 (×3): qty 1

## 2020-01-10 NOTE — MAU Provider Note (Signed)
Chief Complaint:  Contractions and Rupture of Membranes   First Provider Initiated Contact with Patient 01/10/20 2115      HPI: Haley Lopez is a 24 y.o. [id number] at [redacted]w[redacted]d by LMP who presents to maternity admissions reporting onset of contractions at 6 pm tonight followed by leaking mucus and fluid continuously. The fluid has not been enough to soak a pad but has been continuous, when wiping and in her underwear.  It is thick and stringy but wets her underwear.    She reports good fetal movement, denies vaginal bleeding, vaginal itching/burning, urinary symptoms, h/a, dizziness, n/v, or fever/chills.     Location: lower abdomen and low back Quality: cramping/contractions Severity: 7/10 on pain scale Duration: 3-4 hours Timing: intermittent, 5-6 times per hour Modifying factors: none Associated signs and symptoms: leaking fluid/mucus  HPI  Past Medical History: Past Medical History:  Diagnosis Date  . Anemia   . Anxiety   . Constipation   . Depression   . 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 Herpes   . Vision abnormalities     Past obstetric history: OB History  Gravida Para Term Preterm AB Living  6 2 1 1 3 2   SAB TAB Ectopic Multiple Live Births  3 0 0 0 2    # Outcome Date GA Lbr Len/2nd Weight Sex Delivery Anes PTL Lv  6 Current           5 Term 09/06/18 [redacted]w[redacted]d  2410 g M Vag-Spont EPI  LIV     Birth Comments: WDL  4 SAB 04/21/16 [redacted]w[redacted]d   U         Birth Comments: Needed D&E for missed abortion      Complications: Missed abortion  3 Preterm 04/08/15 [redacted]w[redacted]d 05:42 / 00:15 1720 g F Vag-Spont EPI  LIV     Birth Comments: Dr [redacted]w[redacted]d documented [redacted]w[redacted]d  2 SAB           1 SAB             Past Surgical History: Past Surgical History:  Procedure Laterality Date  . DILATION AND EVACUATION N/A 04/21/2016   Procedure: DILATATION AND EVACUATION;  Surgeon: 06/19/2016, MD;  Location: [facility];  Service: Gynecology;  Laterality: N/A;  . WISDOM  TOOTH EXTRACTION      Family History: Family History  Problem Relation Age of Onset  . Depression Mother     Social History: Social History   Tobacco Use  . Smoking status: Former Smoker    Types: Cigars  . Smokeless tobacco: Never Used  . Tobacco comment: with marijuana  Vaping Use  . Vaping Use: Never used  Substance Use Topics  . Alcohol use: Not Currently    Comment: social  . Drug use: Not Currently    Frequency: 14.0 times per week    Types: Marijuana    Comment: Last use July 2021    Allergies:  Allergies  Allergen Reactions  . Feraheme [Ferumoxytol] Shortness Of Breath and Nausea Only  . Latex     Rash with condoms    Meds:  No medications prior to admission.    ROS:  Review of Systems  Constitutional: Negative for chills, fatigue and fever.  Eyes: Negative for visual disturbance.  Respiratory: Negative for shortness of breath.   Cardiovascular: Negative for chest pain.  Gastrointestinal: Positive for abdominal pain. Negative for nausea and vomiting.  Genitourinary: Positive for vaginal discharge. Negative for difficulty urinating, dysuria,  flank pain, pelvic pain, vaginal bleeding and vaginal pain.  Musculoskeletal: Positive for back pain.  Neurological: Negative for dizziness and headaches.  Psychiatric/Behavioral: Negative.      I have reviewed patient's Past Medical Hx, Surgical Hx, Family Hx, Social Hx, medications and allergies.   Physical Exam   Patient Vitals for the past 24 hrs:  BP Temp Temp src Pulse Resp SpO2 Height Weight  01/11/20 0103 115/75 -- -- 98 -- -- -- --  01/11/20 0022 107/63 -- -- -- -- -- -- --  01/10/20 2353 110/81 -- -- -- -- -- -- --  01/10/20 2332 121/80 -- -- -- -- -- -- --  01/10/20 2105 107/63 98.1 F (36.7 C) Oral 79 16 99 % 5\' 1"  (1.549 m) 55.4 kg   Constitutional: Well-developed, well-nourished female in no acute distress.  Cardiovascular: normal rate Respiratory: normal effort GI: Abd soft, non-tender,  gravid appropriate for gestational age.  MS: Extremities nontender, no edema, normal ROM Neurologic: Alert and oriented x 4.  GU: Neg CVAT.  PELVIC EXAM: Cervix pink, visually closed, without lesion, yellow/white thick mucus at os, no pooling of fluid with Valsalva, vaginal walls and external genitalia normal   Dilation: 1 Effacement (%): 50 Station: -2 Presentation: Vertex Exam by:: L. Leftwich-Kirby, CNM  FHT:  Baseline 125 , moderate variability, accelerations present, no decelerations Contractions: q 5-10 mins, mild to palpation   Labs: Results for orders placed or performed during the hospital encounter of 01/10/20 (from the past 24 hour(s))  Amnisure rupture of membrane (rom)not at [facility]     Status: None   Collection Time: 01/10/20  9:23 PM  Result Value Ref Range   Amnisure ROM NEGATIVE    A/Positive/-- (04/20 1412)  Imaging:    MAU Course/MDM: Orders Placed This Encounter  Procedures  . Amnisure rupture of membrane (rom)not at [facility]  . Discharge patient    Meds ordered this encounter  Medications  . NIFEdipine (PROCARDIA) capsule 10 mg     NST reviewed and reactive Pooling, ferning, and amnisure negative so no evidence of ruptured membranes PO fluids and Procardia 10 mg x 3 doses Q 20 minutes given Contractions with some spacing apart on toco but pt reports they are unchanged in intensity Cervix is unchanged in 4 hours in MAU so no evidence of preterm labor Rx for Procardia 10 mg Q 6 hours PRN at home F/U in office this week as scheduled, return to MAU with signs of labor or emergencies  Pt discharge with strict PTL precautions.    Assessment: 1. Threatened preterm labor, third trimester   2. [redacted] weeks gestation of pregnancy   3. Encounter for suspected premature rupture of membranes, with rupture of membranes not found     Plan: Discharge home Labor precautions and fetal kick counts  Follow-up Information    [facility] Follow up.    Contact information: [address] [place] [phone]             Allergies as of 01/11/2020      Reactions   Feraheme [ferumoxytol] Shortness Of Breath, Nausea Only   Latex    Rash with condoms      Medication List    TAKE these medications   acetaminophen 500 MG tablet Commonly known as: TYLENOL Take 500 mg by mouth every 6 (six) hours as needed.   Doxylamine-Pyridoxine 10-10 MG Tbec Commonly known as: Diclegis Take 2 tablets at bedtime and one in the  morning and one in the afternoon as needed for nausea.   ferrous sulfate 325 (65 FE) MG tablet Commonly known as: FerrouSul Take 1 tablet (325 mg total) by mouth 2 (two) times daily.   Misc. Devices Misc Dispense one maternity belt for patient   promethazine 25 MG tablet Commonly known as: PHENERGAN Take 1 tablet (25 mg total) by mouth every 6 (six) hours as needed for nausea or vomiting.   valACYclovir 500 MG tablet Commonly known as: Valtrex Take 1 tablet (500 mg total) by mouth daily.   Vitafol Gummies 3.33-0.333-34.8 MG Chew Chew 1 tablet by mouth daily.       Sharen Counter Certified Nurse-Midwife 01/11/2020 1:53 AM

## 2020-01-10 NOTE — MAU Note (Signed)
Pt reports to MAU complaining of CTX every 10 minutes since 1800. Pt also reports she has been leaking fluids since 1730, she did not feel a gush but a constant leak. +FM. Denies vaginal bleeding.

## 2020-01-11 DIAGNOSIS — O4703 False labor before 37 completed weeks of gestation, third trimester: Secondary | ICD-10-CM

## 2020-01-11 DIAGNOSIS — Z3A35 35 weeks gestation of pregnancy: Secondary | ICD-10-CM

## 2020-01-12 ENCOUNTER — Encounter (HOSPITAL_COMMUNITY): Payer: Self-pay | Admitting: Obstetrics and Gynecology

## 2020-01-12 ENCOUNTER — Inpatient Hospital Stay (HOSPITAL_COMMUNITY)
Admission: AD | Admit: 2020-01-12 | Discharge: 2020-01-12 | Disposition: A | Discharge status: Home / Self Care | Payer: Medicaid Other | Attending: Obstetrics and Gynecology | Admitting: Obstetrics and Gynecology

## 2020-01-12 ENCOUNTER — Other Ambulatory Visit: Payer: Self-pay

## 2020-01-12 DIAGNOSIS — O4703 False labor before 37 completed weeks of gestation, third trimester: Secondary | ICD-10-CM | POA: Insufficient documentation

## 2020-01-12 DIAGNOSIS — O99343 Other mental disorders complicating pregnancy, third trimester: Secondary | ICD-10-CM | POA: Diagnosis not present

## 2020-01-12 DIAGNOSIS — Z87891 Personal history of nicotine dependence: Secondary | ICD-10-CM | POA: Diagnosis not present

## 2020-01-12 DIAGNOSIS — Z79899 Other long term (current) drug therapy: Secondary | ICD-10-CM | POA: Diagnosis not present

## 2020-01-12 DIAGNOSIS — O98513 Other viral diseases complicating pregnancy, third trimester: Secondary | ICD-10-CM | POA: Insufficient documentation

## 2020-01-12 DIAGNOSIS — F329 Major depressive disorder, single episode, unspecified: Secondary | ICD-10-CM | POA: Insufficient documentation

## 2020-01-12 DIAGNOSIS — B009 Herpesviral infection, unspecified: Secondary | ICD-10-CM | POA: Insufficient documentation

## 2020-01-12 DIAGNOSIS — F419 Anxiety disorder, unspecified: Secondary | ICD-10-CM | POA: Insufficient documentation

## 2020-01-12 DIAGNOSIS — Z3A35 35 weeks gestation of pregnancy: Secondary | ICD-10-CM | POA: Insufficient documentation

## 2020-01-12 LAB — WET PREP, GENITAL
Clue Cells Wet Prep HPF POC: NONE SEEN
Sperm: NONE SEEN
Trich, Wet Prep: NONE SEEN
Yeast Wet Prep HPF POC: NONE SEEN

## 2020-01-12 LAB — URINALYSIS, ROUTINE W REFLEX MICROSCOPIC
Bilirubin Urine: NEGATIVE
Glucose, UA: NEGATIVE mg/dL
Hgb urine dipstick: NEGATIVE
Ketones, ur: NEGATIVE mg/dL
Leukocytes,Ua: NEGATIVE
Nitrite: NEGATIVE
Protein, ur: NEGATIVE mg/dL
Specific Gravity, Urine: 1.014 (ref 1.005–1.030)
pH: 6 (ref 5.0–8.0)

## 2020-01-12 MED ORDER — OXYCODONE-ACETAMINOPHEN 5-325 MG PO TABS
2.0000 | ORAL_TABLET | Freq: Once | ORAL | Status: AC
  Administered 2020-01-12: 2 via ORAL
  Filled 2020-01-12: qty 2

## 2020-01-12 NOTE — MAU Provider Note (Signed)
Chief Complaint:  Contractions   First Provider Initiated Contact with Patient 01/12/20 0310      HPI: Haley Lopez is a 24 y.o. [id number] at 107w2d by LMP who presents to maternity admissions reporting contractions have continued since MAU visit yesterday and are 7-15 minutes apart and painful. She reports she is drinking plenty of water. She took 2 doses of Procardia 10 mg as prescribed but the contractions did not slow down. There are no other symptoms.   She reports good fetal movement.   Location: low abdomen radiating to low back Quality: cramping Severity: 8/10 on pain scale Duration: 1 day Timing: intermittent every 7-15 minutes Modifying factors: Procardia and PO fluids have not helped Associated signs and symptoms: None  HPI  Past Medical History: Past Medical History:  Diagnosis Date  . Anemia   . Anxiety   . Constipation   . Depression   . 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 Herpes   . Vision abnormalities     Past obstetric history: OB History  Gravida Para Term Preterm AB Living  6 2 1 1 3 2   SAB TAB Ectopic Multiple Live Births  3 0 0 0 2    # Outcome Date GA Lbr Len/2nd Weight Sex Delivery Anes PTL Lv  6 Current           5 Term 09/06/18 [redacted]w[redacted]d  2410 g M Vag-Spont EPI  LIV     Birth Comments: WDL  4 SAB 04/21/16 [redacted]w[redacted]d   U         Birth Comments: Needed D&E for missed abortion      Complications: Missed abortion  3 Preterm 04/08/15 [redacted]w[redacted]d 05:42 / 00:15 1720 g F Vag-Spont EPI  LIV     Birth Comments: Dr [redacted]w[redacted]d documented [redacted]w[redacted]d  2 SAB           1 SAB             Past Surgical History: Past Surgical History:  Procedure Laterality Date  . DILATION AND EVACUATION N/A 04/21/2016   Procedure: DILATATION AND EVACUATION;  Surgeon: 06/19/2016, MD;  Location: [facility];  Service: Gynecology;  Laterality: N/A;  . WISDOM TOOTH EXTRACTION      Family History: Family History  Problem Relation Age of Onset  . Depression  Mother     Social History: Social History   Tobacco Use  . Smoking status: Former Smoker    Types: Cigars  . Smokeless tobacco: Never Used  . Tobacco comment: with marijuana  Vaping Use  . Vaping Use: Never used  Substance Use Topics  . Alcohol use: Not Currently    Comment: social  . Drug use: Not Currently    Frequency: 14.0 times per week    Types: Marijuana    Comment: Last use July 2021    Allergies:  Allergies  Allergen Reactions  . Feraheme [Ferumoxytol] Shortness Of Breath and Nausea Only  . Latex     Rash with condoms    Meds:  Facility-Administered Medications Prior to Admission  Medication Dose Route Frequency Provider Last Rate Last Admin  . HYDROXYprogesterone caproate (Makena) autoinjector 275 mg  275 mg Subcutaneous Weekly August 2021, MD   275 mg at 01/07/20 0850   Medications Prior to Admission  Medication Sig Dispense Refill Last Dose  . Doxylamine-Pyridoxine (DICLEGIS) 10-10 MG TBEC Take 2 tablets at bedtime and one in the morning and one in the afternoon as needed for  nausea. 60 tablet 2 Past Week at Unknown time  . ferrous sulfate (FERROUSUL) 325 (65 FE) MG tablet Take 1 tablet (325 mg total) by mouth 2 (two) times daily. 60 tablet 1 Past Week at Unknown time  . Prenatal Vit-Fe Phos-FA-Omega (VITAFOL GUMMIES) 3.33-0.333-34.8 MG CHEW Chew 1 tablet by mouth daily. 90 tablet 5 Past Week at Unknown time  . valACYclovir (VALTREX) 500 MG tablet Take 1 tablet (500 mg total) by mouth daily. 30 tablet 0 Past Week at Unknown time  . acetaminophen (TYLENOL) 500 MG tablet Take 500 mg by mouth every 6 (six) hours as needed. (Patient not taking: Reported on 12/18/2019)     . Misc. Devices MISC Dispense one maternity belt for patient (Patient not taking: Reported on 12/30/2019) 1 each 0   . promethazine (PHENERGAN) 25 MG tablet Take 1 tablet (25 mg total) by mouth every 6 (six) hours as needed for nausea or vomiting. (Patient not taking: Reported on 12/18/2019) 30  tablet 1     ROS:  Review of Systems  Constitutional: Negative for chills, fatigue and fever.  Eyes: Negative for visual disturbance.  Respiratory: Negative for shortness of breath.   Cardiovascular: Negative for chest pain.  Gastrointestinal: Positive for abdominal pain. Negative for nausea and vomiting.  Genitourinary: Negative for difficulty urinating, dysuria, flank pain, pelvic pain, vaginal bleeding, vaginal discharge and vaginal pain.  Musculoskeletal: Positive for back pain.  Neurological: Negative for dizziness and headaches.  Psychiatric/Behavioral: Negative.      I have reviewed patient's Past Medical Hx, Surgical Hx, Family Hx, Social Hx, medications and allergies.   Physical Exam   Patient Vitals for the past 24 hrs:  BP Temp Temp src Pulse Resp  01/12/20 0240 118/79 98.2 F (36.8 C) Oral (!) 58 20   Constitutional: Well-developed, well-nourished female in no acute distress.  Cardiovascular: normal rate Respiratory: normal effort GI: Abd soft, non-tender, gravid appropriate for gestational age.  MS: Extremities nontender, no edema, normal ROM Neurologic: Alert and oriented x 4.  GU: Neg CVAT.   Dilation: 2 Effacement (%): 50 Station: -2 Presentation: Vertex Exam by:: L. Leftwich-kirby, CNM  FHT:  Baseline 130 , moderate variability, accelerations present, no decelerations Contractions: q 8-17 mins   Labs: Results for orders placed or performed during the hospital encounter of 01/12/20 (from the past 24 hour(s))  Wet prep, genital     Status: Abnormal   Collection Time: 01/12/20  3:22 AM  Result Value Ref Range   Yeast Wet Prep HPF POC NONE SEEN NONE SEEN   Trich, Wet Prep NONE SEEN NONE SEEN   Clue Cells Wet Prep HPF POC NONE SEEN NONE SEEN   WBC, Wet Prep HPF POC MANY (A) NONE SEEN   Sperm NONE SEEN   Urinalysis, Routine w reflex microscopic     Status: None   Collection Time: 01/12/20  3:22 AM  Result Value Ref Range   Color, Urine YELLOW YELLOW    APPearance CLEAR CLEAR   Specific Gravity, Urine 1.014 1.005 - 1.030   pH 6.0 5.0 - 8.0   Glucose, UA NEGATIVE NEGATIVE mg/dL   Hgb urine dipstick NEGATIVE NEGATIVE   Bilirubin Urine NEGATIVE NEGATIVE   Ketones, ur NEGATIVE NEGATIVE mg/dL   Protein, ur NEGATIVE NEGATIVE mg/dL   Nitrite NEGATIVE NEGATIVE   Leukocytes,Ua NEGATIVE NEGATIVE   A/Positive/-- (04/20 1412)  Imaging:    MAU Course/MDM: Orders Placed This Encounter  Procedures  . Wet prep, genital  . Urinalysis, Routine w reflex microscopic  .  Discharge patient    Meds ordered this encounter  Medications  . oxyCODONE-acetaminophen (PERCOCET/ROXICET) 5-325 MG per tablet 2 tablet     NST reviewed and reactive Cervix slightly more dilated than at yesterday's MAU visit PO hydrate Offered terbutaline and discussed the medication with pt who declined Cervix unchanged in 3+ hours in MAU Therapeutic rest given with Percocet 5/325 x 2 tabs in MAU D/C home with PTL precautions F/U at [facility], message sent to make appt with provider, pt has Makena appt on 01/14/20 Return to MAU for emergencies  Assessment: 1. Threatened preterm labor, third trimester     Plan: Discharge home Labor precautions and fetal kick counts  Follow-up Information    [facility] Follow up.   Why: Follow up with provider on 01/14/20. Return to MAU as needed for signs of labor or emergencies.  Contact information: [address] [place] [address] [phone]             Allergies as of 01/12/2020      Reactions   Feraheme [ferumoxytol] Shortness Of Breath, Nausea Only   Latex    Rash with condoms      Medication List    TAKE these medications   acetaminophen 500 MG tablet Commonly known as: TYLENOL Take 500 mg by mouth every 6 (six) hours as needed.   Doxylamine-Pyridoxine 10-10 MG Tbec Commonly known as: Diclegis Take 2 tablets at bedtime and one in the morning and one in the  afternoon as needed for nausea.   ferrous sulfate 325 (65 FE) MG tablet Commonly known as: FerrouSul Take 1 tablet (325 mg total) by mouth 2 (two) times daily.   Misc. Devices Misc Dispense one maternity belt for patient   promethazine 25 MG tablet Commonly known as: PHENERGAN Take 1 tablet (25 mg total) by mouth every 6 (six) hours as needed for nausea or vomiting.   valACYclovir 500 MG tablet Commonly known as: Valtrex Take 1 tablet (500 mg total) by mouth daily.   Vitafol Gummies 3.33-0.333-34.8 MG Chew Chew 1 tablet by mouth daily.       Sharen Counter Certified Nurse-Midwife 01/12/2020 6:59 AM

## 2020-01-12 NOTE — MAU Note (Signed)
PT SAYS SHE WAS HERE LAST NIGHT - WAS SENT HOME ON PROCARDIA -LAST DOSE WAS 12 AND 6PM.   STILL FEELS UC'S NOW AS SHE DID LAST NIGHT .  DENIES  ANY S/S OF A HSV OUTBREAK .

## 2020-01-13 LAB — GC/CHLAMYDIA PROBE AMP (~~LOC~~) NOT AT ARMC
Chlamydia: NEGATIVE
Comment: NEGATIVE
Comment: NORMAL
Neisseria Gonorrhea: NEGATIVE

## 2020-01-14 ENCOUNTER — Telehealth: Payer: Self-pay

## 2020-01-14 ENCOUNTER — Other Ambulatory Visit: Payer: Self-pay

## 2020-01-14 ENCOUNTER — Ambulatory Visit: Payer: Medicaid Other

## 2020-01-14 ENCOUNTER — Ambulatory Visit (INDEPENDENT_AMBULATORY_CARE_PROVIDER_SITE_OTHER): Payer: Medicaid Other | Admitting: Advanced Practice Midwife

## 2020-01-14 VITALS — BP 113/75 | HR 56 | Wt 119.0 lb

## 2020-01-14 DIAGNOSIS — R102 Pelvic and perineal pain: Secondary | ICD-10-CM

## 2020-01-14 DIAGNOSIS — Z3A35 35 weeks gestation of pregnancy: Secondary | ICD-10-CM

## 2020-01-14 DIAGNOSIS — Z8751 Personal history of pre-term labor: Secondary | ICD-10-CM

## 2020-01-14 DIAGNOSIS — O26893 Other specified pregnancy related conditions, third trimester: Secondary | ICD-10-CM

## 2020-01-14 DIAGNOSIS — O0993 Supervision of high risk pregnancy, unspecified, third trimester: Secondary | ICD-10-CM
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
## 2020-01-14 NOTE — Patient Instructions (Signed)
Reasons to return to MAU at [facility]:  Since you are preterm, return to MAU if:  1.  Contractions are 10 minutes apart or less and they becoming more uncomfortable or painful over time 2.  You have a large gush of fluid, or a trickle of fluid that will not stop and you have to wear a pad 3.  You have bleeding that is bright red, heavier than spotting--like menstrual bleeding (spotting can be normal in early labor or after a check of your cervix) 4.  You do not feel the baby moving like he/she normally does  

## 2020-01-14 NOTE — Telephone Encounter (Signed)
Received call from patient she reports feeling like she needs to have a bowel movement/ pressure for several days.She reports baby is moving well bit the pressure she is feeling is very different then it was over the weekend..She also noticed some wetness in her panties and is concerned. I have schedule her for an appointment to be seen in the office today with Misty Stanley.

## 2020-01-15 NOTE — Addendum Note (Signed)
Addended by: Sharen Counter A on: 01/15/2020 08:22 PM   Modules accepted: Kipp Brood

## 2020-01-16 ENCOUNTER — Ambulatory Visit: Payer: Medicaid Other | Attending: Obstetrics and Gynecology

## 2020-01-16 ENCOUNTER — Ambulatory Visit: Payer: Medicaid Other

## 2020-01-16 ENCOUNTER — Other Ambulatory Visit: Payer: Self-pay

## 2020-01-16 DIAGNOSIS — Z3A35 35 weeks gestation of pregnancy: Secondary | ICD-10-CM | POA: Diagnosis not present

## 2020-01-16 DIAGNOSIS — O365990 Maternal care for other known or suspected poor fetal growth, unspecified trimester, not applicable or unspecified: Secondary | ICD-10-CM | POA: Insufficient documentation

## 2020-01-16 DIAGNOSIS — Z148 Genetic carrier of other disease: Secondary | ICD-10-CM | POA: Diagnosis not present

## 2020-01-16 DIAGNOSIS — O365930 Maternal care for other known or suspected poor fetal growth, third trimester, not applicable or unspecified: Secondary | ICD-10-CM | POA: Diagnosis not present

## 2020-01-16 DIAGNOSIS — O09213 Supervision of pregnancy with history of pre-term labor, third trimester: Secondary | ICD-10-CM | POA: Diagnosis not present

## 2020-01-16 LAB — STREP GP B NAA: Strep Gp B NAA: POSITIVE — AB

## 2020-01-17 ENCOUNTER — Encounter (HOSPITAL_COMMUNITY): Payer: Self-pay | Admitting: Obstetrics & Gynecology

## 2020-01-17 ENCOUNTER — Inpatient Hospital Stay (HOSPITAL_COMMUNITY)
Admission: AD | Admit: 2020-01-17 | Discharge: 2020-01-17 | Disposition: A | Discharge status: Home / Self Care | Payer: Medicaid Other | Attending: Obstetrics & Gynecology | Admitting: Obstetrics & Gynecology

## 2020-01-17 DIAGNOSIS — O479 False labor, unspecified: Secondary | ICD-10-CM | POA: Diagnosis present

## 2020-01-17 DIAGNOSIS — D509 Iron deficiency anemia, unspecified: Secondary | ICD-10-CM

## 2020-01-17 DIAGNOSIS — Z3A00 Weeks of gestation of pregnancy not specified: Secondary | ICD-10-CM | POA: Insufficient documentation

## 2020-01-17 DIAGNOSIS — O99013 Anemia complicating pregnancy, third trimester: Secondary | ICD-10-CM

## 2020-01-17 MED ORDER — ACETAMINOPHEN 325 MG PO TABS
650.0000 mg | ORAL_TABLET | Freq: Once | ORAL | Status: AC
  Administered 2020-01-17: 650 mg via ORAL
  Filled 2020-01-17: qty 2

## 2020-01-17 NOTE — Progress Notes (Addendum)
Dr. Selena Batten notified of v exam, fhr, and ctx.  Orders received to discharge home.

## 2020-01-17 NOTE — MAU Note (Signed)
Ctxs since 0530. Denies LOF or VB. 2cm last sve

## 2020-01-17 NOTE — Discharge Instructions (Signed)
Braxton Hicks Contractions °Contractions of the uterus can occur throughout pregnancy, but they are not always a sign that you are in labor. You may have practice contractions called Braxton Hicks contractions. These false labor contractions are sometimes confused with true labor. °What are Braxton Hicks contractions? °Braxton Hicks contractions are tightening movements that occur in the muscles of the uterus before labor. Unlike true labor contractions, these contractions do not result in opening (dilation) and thinning of the cervix. Toward the end of pregnancy (32-34 weeks), Braxton Hicks contractions can happen more often and may become stronger. These contractions are sometimes difficult to tell apart from true labor because they can be very uncomfortable. You should not feel embarrassed if you go to the hospital with false labor. °Sometimes, the only way to tell if you are in true labor is for your health care provider to look for changes in the cervix. The health care provider will do a physical exam and may monitor your contractions. If you are not in true labor, the exam should show that your cervix is not dilating and your water has not broken. °If there are no other health problems associated with your pregnancy, it is completely safe for you to be sent home with false labor. You may continue to have Braxton Hicks contractions until you go into true labor. °How to tell the difference between true labor and false labor °True labor °· Contractions last 30-70 seconds. °· Contractions become very regular. °· Discomfort is usually felt in the top of the uterus, and it spreads to the lower abdomen and low back. °· Contractions do not go away with walking. °· Contractions usually become more intense and increase in frequency. °· The cervix dilates and gets thinner. °False labor °· Contractions are usually shorter and not as strong as true labor contractions. °· Contractions are usually irregular. °· Contractions  are often felt in the front of the lower abdomen and in the groin. °· Contractions may go away when you walk around or change positions while lying down. °· Contractions get weaker and are shorter-lasting as time goes on. °· The cervix usually does not dilate or become thin. °Follow these instructions at home: ° °· Take over-the-counter and prescription medicines only as told by your health care provider. °· Keep up with your usual exercises and follow other instructions from your health care provider. °· Eat and drink lightly if you think you are going into labor. °· If Braxton Hicks contractions are making you uncomfortable: °? Change your position from lying down or resting to walking, or change from walking to resting. °? Sit and rest in a tub of warm water. °? Drink enough fluid to keep your urine pale yellow. Dehydration may cause these contractions. °? Do slow and deep breathing several times an hour. °· Keep all follow-up prenatal visits as told by your health care provider. This is important. °Contact a health care provider if: °· You have a fever. °· You have continuous pain in your abdomen. °Get help right away if: °· Your contractions become stronger, more regular, and closer together. °· You have fluid leaking or gushing from your vagina. °· You pass blood-tinged mucus (bloody show). °· You have bleeding from your vagina. °· You have low back pain that you never had before. °· You feel your baby’s head pushing down and causing pelvic pressure. °· Your baby is not moving inside you as much as it used to. °Summary °· Contractions that occur before labor are   called Braxton Hicks contractions, false labor, or practice contractions. °· Braxton Hicks contractions are usually shorter, weaker, farther apart, and less regular than true labor contractions. True labor contractions usually become progressively stronger and regular, and they become more frequent. °· Manage discomfort from Braxton Hicks contractions  by changing position, resting in a warm bath, drinking plenty of water, or practicing deep breathing. °This information is not intended to replace advice given to you by your health care provider. Make sure you discuss any questions you have with your health care provider. °Document Revised: 03/10/2017 Document Reviewed: 08/11/2016 °Elsevier Patient Education © 2020 [employer] Inc[employer] ° °

## 2020-01-17 NOTE — Progress Notes (Signed)
Called Dr. Selena Batten to receive order for Tylenol PO.

## 2020-01-20 ENCOUNTER — Encounter: Payer: Self-pay | Admitting: Advanced Practice Midwife

## 2020-01-20 ENCOUNTER — Other Ambulatory Visit: Payer: Self-pay

## 2020-01-20 ENCOUNTER — Inpatient Hospital Stay (HOSPITAL_COMMUNITY)
Admission: AD | Admit: 2020-01-20 | Discharge: 2020-01-22 | DRG: 806 | Disposition: A | Discharge status: Home / Self Care | Payer: Medicaid Other | Attending: Obstetrics & Gynecology | Admitting: Obstetrics & Gynecology

## 2020-01-20 ENCOUNTER — Encounter (HOSPITAL_COMMUNITY): Payer: Self-pay | Admitting: Obstetrics & Gynecology

## 2020-01-20 DIAGNOSIS — D509 Iron deficiency anemia, unspecified: Secondary | ICD-10-CM | POA: Diagnosis present

## 2020-01-20 DIAGNOSIS — Z8619 Personal history of other infectious and parasitic diseases: Secondary | ICD-10-CM | POA: Diagnosis present

## 2020-01-20 DIAGNOSIS — A6000 Herpesviral infection of urogenital system, unspecified: Secondary | ICD-10-CM | POA: Diagnosis present

## 2020-01-20 DIAGNOSIS — O42913 Preterm premature rupture of membranes, unspecified as to length of time between rupture and onset of labor, third trimester: Principal | ICD-10-CM | POA: Diagnosis present

## 2020-01-20 DIAGNOSIS — Z20822 Contact with and (suspected) exposure to covid-19: Secondary | ICD-10-CM | POA: Diagnosis present

## 2020-01-20 DIAGNOSIS — O9832 Other infections with a predominantly sexual mode of transmission complicating childbirth: Secondary | ICD-10-CM | POA: Diagnosis present

## 2020-01-20 DIAGNOSIS — Z87891 Personal history of nicotine dependence: Secondary | ICD-10-CM

## 2020-01-20 DIAGNOSIS — O99824 Streptococcus B carrier state complicating childbirth: Secondary | ICD-10-CM | POA: Diagnosis present

## 2020-01-20 DIAGNOSIS — F419 Anxiety disorder, unspecified: Secondary | ICD-10-CM | POA: Diagnosis present

## 2020-01-20 DIAGNOSIS — O99013 Anemia complicating pregnancy, third trimester: Secondary | ICD-10-CM | POA: Diagnosis present

## 2020-01-20 DIAGNOSIS — D563 Thalassemia minor: Secondary | ICD-10-CM | POA: Diagnosis present

## 2020-01-20 DIAGNOSIS — Z3A36 36 weeks gestation of pregnancy: Secondary | ICD-10-CM | POA: Diagnosis not present

## 2020-01-20 DIAGNOSIS — O9902 Anemia complicating childbirth: Secondary | ICD-10-CM | POA: Diagnosis present

## 2020-01-20 DIAGNOSIS — F321 Major depressive disorder, single episode, moderate: Secondary | ICD-10-CM | POA: Diagnosis present

## 2020-01-20 DIAGNOSIS — Z3490 Encounter for supervision of normal pregnancy, unspecified, unspecified trimester: Secondary | ICD-10-CM

## 2020-01-20 DIAGNOSIS — Z30430 Encounter for insertion of intrauterine contraceptive device: Secondary | ICD-10-CM

## 2020-01-20 DIAGNOSIS — O99820 Streptococcus B carrier state complicating pregnancy: Secondary | ICD-10-CM | POA: Insufficient documentation

## 2020-01-20 DIAGNOSIS — Z113 Encounter for screening for infections with a predominantly sexual mode of transmission: Secondary | ICD-10-CM

## 2020-01-20 DIAGNOSIS — B009 Herpesviral infection, unspecified: Secondary | ICD-10-CM | POA: Diagnosis present

## 2020-01-20 DIAGNOSIS — Z8751 Personal history of pre-term labor: Secondary | ICD-10-CM

## 2020-01-20 LAB — CBC
HCT: 39.1 % (ref 36.0–46.0)
Hemoglobin: 12.1 g/dL (ref 12.0–15.0)
MCH: 26.7 pg (ref 26.0–34.0)
MCHC: 30.9 g/dL (ref 30.0–36.0)
MCV: 86.3 fL (ref 80.0–100.0)
Platelets: 262 10*3/uL (ref 150–400)
RBC: 4.53 MIL/uL (ref 3.87–5.11)
RDW: 13.1 % (ref 11.5–15.5)
WBC: 6.1 10*3/uL (ref 4.0–10.5)
nRBC: 0 % (ref 0.0–0.2)

## 2020-01-20 LAB — RESPIRATORY PANEL BY RT PCR (FLU A&B, COVID)
Influenza A by PCR: NEGATIVE
Influenza B by PCR: NEGATIVE
SARS Coronavirus 2 by RT PCR: NEGATIVE

## 2020-01-20 LAB — TYPE AND SCREEN
ABO/RH(D): A POS
Antibody Screen: NEGATIVE

## 2020-01-20 LAB — RPR: RPR Ser Ql: NONREACTIVE

## 2020-01-20 MED ORDER — LIDOCAINE HCL (PF) 1 % IJ SOLN: 30.0000 mL | INTRAMUSCULAR | Status: DC | PRN

## 2020-01-20 MED ORDER — OXYTOCIN-SODIUM CHLORIDE 30-0.9 UT/500ML-% IV SOLN
2.5000 [IU]/h | INTRAVENOUS | Status: DC
  Filled 2020-01-20: qty 500

## 2020-01-20 MED ORDER — ONDANSETRON HCL 4 MG/2ML IJ SOLN
4.0000 mg | INTRAMUSCULAR | Status: DC | PRN
  Administered 2020-01-21: 4 mg via INTRAVENOUS
  Filled 2020-01-20: qty 2

## 2020-01-20 MED ORDER — LACTATED RINGERS IV SOLN
INTRAVENOUS | Status: DC
  Administered 2020-01-20: 1000 mL via INTRAVENOUS

## 2020-01-20 MED ORDER — OXYCODONE-ACETAMINOPHEN 5-325 MG PO TABS
2.0000 | ORAL_TABLET | ORAL | Status: DC | PRN
  Filled 2020-01-20: qty 2

## 2020-01-20 MED ORDER — FENTANYL CITRATE (PF) 100 MCG/2ML IJ SOLN
INTRAMUSCULAR | Status: AC
  Filled 2020-01-20: qty 2

## 2020-01-20 MED ORDER — IBUPROFEN 600 MG PO TABS
600.0000 mg | ORAL_TABLET | Freq: Four times a day (QID) | ORAL | Status: DC
  Administered 2020-01-20 – 2020-01-21 (×3): 600 mg via ORAL
  Filled 2020-01-20 (×3): qty 1

## 2020-01-20 MED ORDER — ACETAMINOPHEN 325 MG PO TABS
650.0000 mg | ORAL_TABLET | Freq: Four times a day (QID) | ORAL | Status: DC
  Administered 2020-01-20 – 2020-01-22 (×8): 650 mg via ORAL
  Filled 2020-01-20 (×9): qty 2

## 2020-01-20 MED ORDER — OXYCODONE HCL 5 MG PO TABS
5.0000 mg | ORAL_TABLET | ORAL | Status: AC | PRN
  Administered 2020-01-20 – 2020-01-21 (×4): 5 mg via ORAL
  Filled 2020-01-20 (×4): qty 1

## 2020-01-20 MED ORDER — LEVONORGESTREL 19.5 MCG/DAY IU IUD
INTRAUTERINE_SYSTEM | Freq: Once | INTRAUTERINE | Status: AC
  Administered 2020-01-20: 1 via INTRAUTERINE

## 2020-01-20 MED ORDER — SENNOSIDES-DOCUSATE SODIUM 8.6-50 MG PO TABS
2.0000 | ORAL_TABLET | ORAL | Status: DC
  Administered 2020-01-21 – 2020-01-22 (×2): 2 via ORAL
  Filled 2020-01-20 (×2): qty 2

## 2020-01-20 MED ORDER — PENICILLIN G POT IN DEXTROSE 60000 UNIT/ML IV SOLN: 3.0000 10*6.[IU] | INTRAVENOUS | Status: DC

## 2020-01-20 MED ORDER — BENZOCAINE-MENTHOL 20-0.5 % EX AERO: 1.0000 "application " | INHALATION_SPRAY | CUTANEOUS | Status: DC | PRN

## 2020-01-20 MED ORDER — DIBUCAINE (PERIANAL) 1 % EX OINT: 1.0000 "application " | TOPICAL_OINTMENT | CUTANEOUS | Status: DC | PRN

## 2020-01-20 MED ORDER — LACTATED RINGERS IV SOLN: 500.0000 mL | INTRAVENOUS | Status: DC | PRN

## 2020-01-20 MED ORDER — DIPHENHYDRAMINE HCL 25 MG PO CAPS: 25.0000 mg | ORAL_CAPSULE | Freq: Four times a day (QID) | ORAL | Status: DC | PRN

## 2020-01-20 MED ORDER — FENTANYL CITRATE (PF) 100 MCG/2ML IJ SOLN
100.0000 ug | INTRAMUSCULAR | Status: DC | PRN
  Administered 2020-01-20 (×2): 100 ug via INTRAVENOUS
  Filled 2020-01-20: qty 2

## 2020-01-20 MED ORDER — ACETAMINOPHEN 325 MG PO TABS
650.0000 mg | ORAL_TABLET | ORAL | Status: DC | PRN
  Administered 2020-01-20: 650 mg via ORAL
  Filled 2020-01-20: qty 2

## 2020-01-20 MED ORDER — LEVONORGESTREL 19.5 MCG/DAY IU IUD
INTRAUTERINE_SYSTEM | INTRAUTERINE | Status: AC
  Filled 2020-01-20: qty 1

## 2020-01-20 MED ORDER — TETANUS-DIPHTH-ACELL PERTUSSIS 5-2.5-18.5 LF-MCG/0.5 IM SUSP: 0.5000 mL | Freq: Once | INTRAMUSCULAR | Status: DC

## 2020-01-20 MED ORDER — ONDANSETRON HCL 4 MG PO TABS
4.0000 mg | ORAL_TABLET | ORAL | Status: DC | PRN
  Administered 2020-01-21 (×2): 4 mg via ORAL
  Filled 2020-01-20 (×2): qty 1

## 2020-01-20 MED ORDER — ZOLPIDEM TARTRATE 5 MG PO TABS: 5.0000 mg | ORAL_TABLET | Freq: Every evening | ORAL | Status: DC | PRN

## 2020-01-20 MED ORDER — SODIUM CHLORIDE 0.9 % IV SOLN
5.0000 10*6.[IU] | Freq: Once | INTRAVENOUS | Status: AC
  Administered 2020-01-20: 5 10*6.[IU] via INTRAVENOUS
  Filled 2020-01-20: qty 5

## 2020-01-20 MED ORDER — FLEET ENEMA 7-19 GM/118ML RE ENEM: 1.0000 | ENEMA | RECTAL | Status: DC | PRN

## 2020-01-20 MED ORDER — OXYCODONE-ACETAMINOPHEN 5-325 MG PO TABS: 1.0000 | ORAL_TABLET | ORAL | Status: DC | PRN

## 2020-01-20 MED ORDER — SOD CITRATE-CITRIC ACID 500-334 MG/5ML PO SOLN: 30.0000 mL | ORAL | Status: DC | PRN

## 2020-01-20 MED ORDER — COCONUT OIL OIL: 1.0000 "application " | TOPICAL_OIL | Status: DC | PRN

## 2020-01-20 MED ORDER — OXYTOCIN BOLUS FROM INFUSION
333.0000 mL | Freq: Once | INTRAVENOUS | Status: AC
  Administered 2020-01-20: 333 mL via INTRAVENOUS

## 2020-01-20 MED ORDER — WITCH HAZEL-GLYCERIN EX PADS
1.0000 "application " | MEDICATED_PAD | CUTANEOUS | Status: DC | PRN
  Administered 2020-01-21: 1 via TOPICAL

## 2020-01-20 MED ORDER — SIMETHICONE 80 MG PO CHEW: 80.0000 mg | CHEWABLE_TABLET | ORAL | Status: DC | PRN

## 2020-01-20 MED ORDER — ONDANSETRON HCL 4 MG/2ML IJ SOLN
4.0000 mg | Freq: Four times a day (QID) | INTRAMUSCULAR | Status: DC | PRN
  Administered 2020-01-20: 4 mg via INTRAVENOUS
  Filled 2020-01-20: qty 2
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
## 2020-01-20 NOTE — Progress Notes (Signed)
Pt reports urge to push-rectal pressure-SVE
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
## 2020-01-20 NOTE — MAU Note (Signed)
. °  Haley Lopez is a 24 y.o. at [redacted]w[redacted]d here in MAU reporting: ctx 1 minute apart, SROM at 0030, No VB, endorses good fetal movement.   Pain score: 10 Vitals:   01/20/20 0128  BP: 126/60  Pulse: 64  Resp: 16  Temp: 98.1 F (36.7 C)  SpO2: 100%     FHT:132
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
## 2020-01-20 NOTE — Progress Notes (Signed)
CSW escorted [place] CPS worker, Michelle Elby to and from MOB's room ([phone]). Per CPS there are no barriers to infant discharging to MOB when infant is medically ready. CPS will continue to provide resources and supportsto family post discharge.   Narcisa Ganesh Boyd-Gilyard, MSW, LCSW Clinical Social Work [phone] 

## 2020-01-21 ENCOUNTER — Encounter: Payer: Self-pay | Admitting: Obstetrics

## 2020-01-21 ENCOUNTER — Encounter: Payer: Medicaid Other | Admitting: Obstetrics and Gynecology

## 2020-01-21 MED ORDER — POLYETHYLENE GLYCOL 3350 17 G PO PACK
17.0000 g | PACK | Freq: Every day | ORAL | Status: DC
  Filled 2020-01-21: qty 1

## 2020-01-21 MED ORDER — IBUPROFEN 800 MG PO TABS
800.0000 mg | ORAL_TABLET | Freq: Four times a day (QID) | ORAL | Status: DC
  Administered 2020-01-21 – 2020-01-22 (×5): 800 mg via ORAL
  Filled 2020-01-21 (×6): qty 1

## 2020-01-21 MED ORDER — OXYCODONE HCL 5 MG PO TABS
5.0000 mg | ORAL_TABLET | Freq: Once | ORAL | Status: AC
  Administered 2020-01-21: 5 mg via ORAL
  Filled 2020-01-21: qty 1
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
## 2020-01-22 MED ORDER — COCONUT OIL OIL: 1.0000 "application " | TOPICAL_OIL | 0 refills | Status: DC | PRN

## 2020-01-22 MED ORDER — SENNOSIDES-DOCUSATE SODIUM 8.6-50 MG PO TABS: 2.0000 | ORAL_TABLET | ORAL | 0 refills | Status: DC

## 2020-01-22 MED ORDER — OXYCODONE HCL 5 MG PO TABS: 5.0000 mg | ORAL_TABLET | Freq: Four times a day (QID) | ORAL | 0 refills | Status: DC | PRN

## 2020-01-22 MED ORDER — IBUPROFEN 800 MG PO TABS
800.0000 mg | ORAL_TABLET | Freq: Three times a day (TID) | ORAL | 0 refills | Status: DC | PRN
Start: 2020-01-22 — End: 2020-12-29

## 2020-01-22 MED ORDER — POLYETHYLENE GLYCOL 3350 17 G PO PACK: 17.0000 g | PACK | Freq: Every day | ORAL | 0 refills | Status: AC

## 2020-01-22 NOTE — Discharge Instructions (Signed)

## 2020-01-24 ENCOUNTER — Ambulatory Visit: Payer: Medicaid Other

## 2020-01-27 ENCOUNTER — Telehealth: Payer: Self-pay

## 2020-01-27 NOTE — Telephone Encounter (Signed)
Return call to pt regarding return to work note. 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  I called to confirm return date. I did send pt a Mychart message that a provider will still have to be made aware for approval.

## 2020-01-28 ENCOUNTER — Other Ambulatory Visit: Payer: Self-pay

## 2020-01-28 ENCOUNTER — Ambulatory Visit (INDEPENDENT_AMBULATORY_CARE_PROVIDER_SITE_OTHER): Payer: Medicaid Other | Admitting: Licensed Clinical Social Worker

## 2020-01-28 DIAGNOSIS — R300 Dysuria: Secondary | ICD-10-CM

## 2020-01-28 DIAGNOSIS — F4322 Adjustment disorder with anxiety: Secondary | ICD-10-CM

## 2020-01-28 MED ORDER — NITROFURANTOIN MONOHYD MACRO 100 MG PO CAPS: 100.0000 mg | ORAL_CAPSULE | Freq: Two times a day (BID) | ORAL | 0 refills | Status: DC

## 2020-01-28 NOTE — Progress Notes (Signed)
Pt complains of burning w/ urination and odor. Consulted w/ provider. Ok to send Rx if no relief pt need appt to leave urine cx.  Pt made aware and voiced understanding.

## 2020-01-29 NOTE — BH Specialist Note (Signed)
Integrated Behavioral Health via Telemedicine Video (Caregility) Visit  01/29/2020 Haley Lopez [id number]  Number of Integrated Behavioral Health visits: 3 Session Start time: 10:05am  Session End time: 10:42am Total time: 37 minutes via mychart   Referring Provider: Hospital discharge  Type of Service: Individual Patient/Family location: Home  [facility] Provider location: [facility]  All persons participating in visit: Pt Haley Lopez and LCSWA A. Felton Clinton    I connected with Haley Lopez and/or Haley Lopez's n/a by a video enabled telemedicine application (Caregility) and verified that I am speaking with the correct person using two identifiers.   Discussed confidentiality: yes  Confirmed demographics & insurance:  no  I discussed that engaging in this virtual visit, they consent to the provision of behavioral healthcare and the services will be billed under their insurance.   Patient and/or legal guardian expressed understanding and consented to virtual visit: yes  PRESENTING CONCERNS: Patient and/or family reports the following symptoms/concerns: anxiety, trouble staying asleep, feelings of guilt Duration of problem: approx one year ; Severity of problem: mild   STRENGTHS (Protective Factors/Coping Skills): Haley Lopez reports father of baby and mother are supportive   ASSESSMENT: Patient currently experiencing adjustment disorder with anxious mood. Haley Lopez reports relationship stress with father of baby is her main source of anxiety   GOALS ADDRESSED: Patient will: 1.  Reduce symptoms of: adjustment disorder   2.  Increase knowledge and/or ability of: coping skills to alleviate symptoms and prevent progress of new symptoms   3.  Demonstrate ability to: self manage symptoms    Progress of Goals: [name] reports high motivation to complete goals and will follow up in three weeks    INTERVENTIONS: Interventions utilized:  Supportive counseling  Standardized Assessments completed &  reviewed: edinburgh 01/28/2020 score 12     PLAN: 1. Follow up with behavioral health clinician on : 3 weeks mychart  2. Behavioral recommendations: Stress reducing activities, prioritize rest, and communicate needs for additional support  3. Referral(s): n/a  I discussed the assessment and treatment plan with the patient and/or parent/guardian. They were provided an opportunity to ask questions and all were answered. They agreed with the plan and demonstrated an understanding of the instructions.   They were advised to call back or seek an in-person evaluation as appropriate.  I discussed that the purpose of this visit is to provide behavioral health care while limiting exposure to the novel coronavirus.  Discussed there is a possibility of technology failure and discussed alternative modes of communication if that failure occurs.  Haley Lopez

## 2020-01-31 ENCOUNTER — Ambulatory Visit: Payer: Medicaid Other

## 2020-02-14 ENCOUNTER — Inpatient Hospital Stay (HOSPITAL_COMMUNITY): Admit: 2020-02-14 | Payer: Self-pay

## 2020-02-17 ENCOUNTER — Telehealth: Payer: Self-pay | Admitting: Licensed Clinical Social Worker

## 2020-02-17 ENCOUNTER — Other Ambulatory Visit: Payer: Self-pay

## 2020-02-17 NOTE — Telephone Encounter (Signed)
Called pt to reschedule 11/9/ for today via mychart

## 2020-02-18 ENCOUNTER — Ambulatory Visit: Payer: Medicaid Other | Admitting: Nurse Practitioner

## 2020-02-18 ENCOUNTER — Ambulatory Visit: Payer: Medicaid Other | Admitting: Licensed Clinical Social Worker

## 2020-02-19 ENCOUNTER — Encounter: Payer: Medicaid Other | Admitting: Licensed Clinical Social Worker

## 2020-03-09 ENCOUNTER — Ambulatory Visit: Payer: Medicaid Other | Admitting: Obstetrics & Gynecology

## 2020-05-01 ENCOUNTER — Ambulatory Visit: Payer: Medicaid Other | Admitting: Obstetrics & Gynecology

## 2020-06-03 ENCOUNTER — Ambulatory Visit: Payer: Medicaid Other | Admitting: Women's Health

## 2020-06-04 ENCOUNTER — Ambulatory Visit (INDEPENDENT_AMBULATORY_CARE_PROVIDER_SITE_OTHER): Payer: Medicaid Other | Admitting: Obstetrics and Gynecology

## 2020-06-04 ENCOUNTER — Other Ambulatory Visit: Payer: Self-pay

## 2020-06-04 ENCOUNTER — Other Ambulatory Visit (HOSPITAL_COMMUNITY)
Admission: RE | Admit: 2020-06-04 | Discharge: 2020-06-04 | Disposition: A | Discharge status: Home / Self Care | Payer: Medicaid Other | Source: Ambulatory Visit | Attending: Obstetrics & Gynecology | Admitting: Obstetrics & Gynecology

## 2020-06-04 VITALS — BP 117/60 | HR 72 | Wt 111.0 lb

## 2020-06-04 DIAGNOSIS — Z30431 Encounter for routine checking of intrauterine contraceptive device: Secondary | ICD-10-CM | POA: Insufficient documentation

## 2020-06-04 DIAGNOSIS — Z975 Presence of (intrauterine) contraceptive device: Secondary | ICD-10-CM | POA: Insufficient documentation

## 2020-06-04 DIAGNOSIS — Z113 Encounter for screening for infections with a predominantly sexual mode of transmission: Secondary | ICD-10-CM | POA: Insufficient documentation

## 2020-06-04 NOTE — Progress Notes (Signed)
    GYNECOLOGY OFFICE ENCOUNTER NOTE  History:  25 y.o. [id number] here today for today for IUD string check; Liletta  IUD was placed 01/20/20 post placental.  No complaints about the IUD, no concerning side effects. + white discharge, she requests STI testing.  She is in counseling currently d/t "PP issues". No concerns that she feels is necessary to discuss today.   The following portions of the patient's history were reviewed and updated as appropriate: allergies, current medications, past family history, past medical history, past social history, past surgical history and problem list. Last pap smear on 07/30/19 was normal, negative HRHPV.  Review of Systems:  Pertinent items are noted in HPI.   Objective:  Physical Exam Blood pressure 117/60, pulse 72, weight 111 lb (50.3 kg), last menstrual period 05/15/2020, unknown if currently breastfeeding. CONSTITUTIONAL: Well-developed, well-nourished female in no acute distress.  HENT:  Normocephalic, atraumatic. External right and left ear normal. Oropharynx is clear and moist EYES: Conjunctivae and EOM are normal. Pupils are equal, round, and reactive to light. No scleral icterus.  NECK: Normal range of motion, supple, no masses CARDIOVASCULAR: Normal heart rate noted RESPIRATORY: Effort and breath sounds normal, no problems with respiration noted ABDOMEN: Soft, no distention noted.   PELVIC: Normal appearing external genitalia; normal appearing vaginal mucosa and cervix.  IUD strings visualized, about 3 cm in length tucked around cervix. Vaginal  Swabs collected.   Assessment & Plan:  Patient to keep IUD in place for up to seven years; can come in for removal if she desires pregnancy earlier or for any concerning side effects.  1. IUD check up   2. Screening for STD (sexually transmitted disease)   Marnisha Stampley, Harolyn Rutherford, NP Faculty Practice Center for [employer], [facility]

## 2020-06-04 NOTE — Progress Notes (Signed)
Pt did not have follow up after delivery in October.  Pt had PP IUD placed, would like check today.

## 2020-06-08 LAB — CERVICOVAGINAL ANCILLARY ONLY
Bacterial Vaginitis (gardnerella): POSITIVE — AB
Chlamydia: NEGATIVE
Comment: NEGATIVE
Comment: NEGATIVE
Comment: NEGATIVE
Comment: NORMAL
Neisseria Gonorrhea: NEGATIVE
Trichomonas: NEGATIVE

## 2020-06-10 ENCOUNTER — Other Ambulatory Visit: Payer: Self-pay | Admitting: Obstetrics and Gynecology

## 2020-06-10 MED ORDER — METRONIDAZOLE 500 MG PO TABS: 500.0000 mg | ORAL_TABLET | Freq: Two times a day (BID) | ORAL | 0 refills | Status: AC

## 2020-12-15 ENCOUNTER — Ambulatory Visit: Payer: Medicaid Other | Admitting: Women's Health

## 2020-12-15 NOTE — Progress Notes (Deleted)
Vaginal Delivery October 2021 Last PAP 07/30/19

## 2020-12-21 ENCOUNTER — Other Ambulatory Visit (HOSPITAL_COMMUNITY)
Admission: RE | Admit: 2020-12-21 | Discharge: 2020-12-21 | Disposition: A | Discharge status: Home / Self Care | Payer: Medicaid Other | Source: Ambulatory Visit | Attending: Women's Health | Admitting: Women's Health

## 2020-12-21 ENCOUNTER — Other Ambulatory Visit: Payer: Self-pay

## 2020-12-21 ENCOUNTER — Ambulatory Visit (INDEPENDENT_AMBULATORY_CARE_PROVIDER_SITE_OTHER): Payer: Medicaid Other | Admitting: Advanced Practice Midwife

## 2020-12-21 VITALS — BP 126/82 | HR 73 | Wt 113.0 lb

## 2020-12-21 DIAGNOSIS — Z202 Contact with and (suspected) exposure to infections with a predominantly sexual mode of transmission: Secondary | ICD-10-CM

## 2020-12-21 DIAGNOSIS — Z87898 Personal history of other specified conditions: Secondary | ICD-10-CM | POA: Diagnosis not present

## 2020-12-21 DIAGNOSIS — Z975 Presence of (intrauterine) contraceptive device: Secondary | ICD-10-CM | POA: Diagnosis not present

## 2020-12-21 LAB — POCT URINE PREGNANCY: Preg Test, Ur: NEGATIVE

## 2020-12-21 NOTE — Progress Notes (Signed)
   GYNECOLOGY PROGRESS NOTE  History:  25 y.o. [id number] presents to [facility] office today for problem gyn visit. She reports new sexual partner who told her he had sores in his mouth after oral/vaginal sex. She is concerned about possible STDs from her previous partner with whom she is no longer involved.  She denies any vaginal dischage/itching/burning/odor.  She denies h/a, dizziness, shortness of breath, n/v, or fever/chills.    The following portions of the patient's history were reviewed and updated as appropriate: allergies, current medications, past family history, past medical history, past social history, past surgical history and problem list. Last pap smear on 07/30/19 was normal.  Health Maintenance Due  Topic Date Due   COVID-19 Vaccine (1) Never done   Pneumococcal Vaccine 43-104 Years old (1 - PCV) Never done   HPV VACCINES (1 - 2-dose series) Never done   TETANUS/TDAP  01/03/2018   INFLUENZA VACCINE  11/09/2020     Review of Systems:  Pertinent items are noted in HPI.   Objective:  Physical Exam Blood pressure 126/82, pulse 73, weight 113 lb (51.3 kg), unknown if currently breastfeeding. VS reviewed, nursing note reviewed,  Constitutional: well developed, well nourished, no distress HEENT: normocephalic CV: normal rate Pulm/chest wall: normal effort Breast Exam: deferred Abdomen: soft Neuro: alert and oriented x 3 Skin: warm, dry Psych: affect normal Pelvic exam: Pt collected self swab  Assessment & Plan:  1. Possible exposure to STD  - Cervicovaginal ancillary only( [place]) - POCT urine pregnancy - HIV Antibody (routine testing w rflx) - Hepatitis B surface antigen - RPR - Hepatitis C antibody   Sharen Counter, CNM 2:54 PM

## 2020-12-22 DIAGNOSIS — Z87898 Personal history of other specified conditions: Secondary | ICD-10-CM | POA: Insufficient documentation

## 2020-12-22 LAB — HEPATITIS C ANTIBODY: Hep C Virus Ab: 0.2 s/co ratio (ref 0.0–0.9)

## 2020-12-22 LAB — CERVICOVAGINAL ANCILLARY ONLY
Chlamydia: NEGATIVE
Comment: NEGATIVE
Comment: NEGATIVE
Comment: NORMAL
Neisseria Gonorrhea: NEGATIVE
Trichomonas: NEGATIVE

## 2020-12-22 LAB — RPR: RPR Ser Ql: NONREACTIVE

## 2020-12-22 LAB — HIV ANTIBODY (ROUTINE TESTING W REFLEX): HIV Screen 4th Generation wRfx: NONREACTIVE

## 2020-12-22 LAB — HEPATITIS B SURFACE ANTIGEN: Hepatitis B Surface Ag: NEGATIVE

## 2020-12-29 ENCOUNTER — Other Ambulatory Visit: Payer: Self-pay

## 2020-12-29 ENCOUNTER — Inpatient Hospital Stay (HOSPITAL_COMMUNITY)
Admission: AD | Admit: 2020-12-29 | Discharge: 2020-12-29 | Disposition: A | Discharge status: Home / Self Care | Payer: Medicaid Other | Attending: Obstetrics and Gynecology | Admitting: Obstetrics and Gynecology

## 2020-12-29 DIAGNOSIS — R109 Unspecified abdominal pain: Secondary | ICD-10-CM | POA: Diagnosis present

## 2020-12-29 DIAGNOSIS — Z975 Presence of (intrauterine) contraceptive device: Secondary | ICD-10-CM

## 2020-12-29 DIAGNOSIS — Z3202 Encounter for pregnancy test, result negative: Secondary | ICD-10-CM

## 2020-12-29 LAB — POCT PREGNANCY, URINE: Preg Test, Ur: NEGATIVE

## 2020-12-29 NOTE — MAU Provider Note (Signed)
Event Date/Time   First Provider Initiated Contact with Patient 12/29/20 1227      S [name] is a 25 y.o. [id number] patient who presents to MAU today with complaint of cramping and concerns about pregnancy.   O BP (!) 113/58 (BP Location: Right Arm)   Pulse 72   Temp 98.1 F (36.7 C) (Oral)   Resp 20   Ht 5\' 1"  (1.549 m)   Wt 52.4 kg   SpO2 99%   BMI 21.82 kg/m  Physical Exam Constitutional:      General: She is not in acute distress.    Appearance: She is well-developed. She is not ill-appearing, toxic-appearing or diaphoretic.  Neurological:     Mental Status: She is alert and oriented to person, place, and time.    A Medical screening exam complete Pregnancy test is negative IUD in place   P Discharge from MAU in stable condition Patient given the option of transfer to [facility] for further evaluation or seek care in outpatient facility of choice. Patient does have GYN office.  List of options for follow-up given  Warning signs for worsening condition that would warrant emergency follow-up discussed Patient may return to MAU as needed   Rex Magee, [facility], NP 12/29/2020 12:30 PM

## 2020-12-29 NOTE — MAU Note (Signed)
Presents stating she's having abdominal cramping, states it's not menstrual cramps because she's currently not menstruating.  Reports has a Mirena IUD in place.

## 2021-01-12 ENCOUNTER — Other Ambulatory Visit: Payer: Self-pay

## 2021-01-12 ENCOUNTER — Encounter: Payer: Self-pay | Admitting: Family Medicine

## 2021-01-12 ENCOUNTER — Ambulatory Visit (INDEPENDENT_AMBULATORY_CARE_PROVIDER_SITE_OTHER): Payer: Medicaid Other | Admitting: Family Medicine

## 2021-01-12 VITALS — BP 122/79 | HR 82 | Wt 115.8 lb

## 2021-01-12 DIAGNOSIS — Z30431 Encounter for routine checking of intrauterine contraceptive device: Secondary | ICD-10-CM | POA: Diagnosis not present

## 2021-01-12 DIAGNOSIS — N920 Excessive and frequent menstruation with regular cycle: Secondary | ICD-10-CM | POA: Diagnosis not present

## 2021-01-12 NOTE — Progress Notes (Signed)
   GYNECOLOGY OFFICE VISIT NOTE  History:   Haley Lopez is a 25 y.o. [phone] here today for evaluation of her IUD and concern for irregular menses. Patient states that she had her IUD placed 1 year ago. She has had normal periods every month with it in place until recently. She states her last period was on 8/5. She describes it as spotting and different from her normal periods. She has taken a few home pregnancy tests which have been negative, but she is concerned about this. She would like to make sure that her IUD is in the right place and that she is not pregnant. She denies pelvic/abdominal pain. Only has cramping from time to time. She has no other concerns today.    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   Health Maintenance:  Normal pap on 07/30/2019; follow up due 07/2022  Review of Systems:  Pertinent items noted in HPI and remainder of comprehensive ROS otherwise negative.  Physical Exam:  BP 122/79   Pulse 82   Wt 115 lb 12.8 oz (52.5 kg)   LMP 11/13/2020   BMI 21.88 kg/m   CONSTITUTIONAL: Well-developed, well-nourished female in no acute distress.  CARDIOVASCULAR: Normal heart rate noted. RESPIRATORY: Normal work of breathing on room air.  PELVIC: Normal appearing external genitalia, normal vagina mucosa and cervix with IUD strings  visible exiting the external os.  SKIN: Warm and dry.  MUSCULOSKELETAL: Normal range of motion. No LE edema noted. NEUROLOGIC: Alert and oriented to person, place, and time. No focal deficit noted.  PSYCHIATRIC: Normal mood and affect. Normal behavior. Normal judgment and thought content.  Assessment and Plan:   1. IUD check up 2. Spotting Reassured patient that IUD appears to be appropriately positioned based on exam today. Urine pregnancy testing negative last visit on 9/20. Multiple home tests negative as well. Discussed that spotting/irregular bleeding can occur with IUD in place and that some women stop having periods once they have an IUD in place for some time. Reviewed options for further management. Will monitor bleeding over the next few cycles. If patient is dissatisfied with bleeding pattern, will consider alternatives. Offered repeat urine pregnancy testing despite this being unlikely, however, patient did not leave sample. Patient will RTC as needed to discuss this further.  - POCT urine pregnancy  Routine preventative health maintenance measures emphasized.  Please refer to After Visit Summary for other counseling recommendations.   Return if symptoms worsen or fail to improve.    Evalina Field, MD OB Fellow, Faculty [facility], Center for [facility] 01/12/2021 5:02 PM

## 2021-01-15 ENCOUNTER — Other Ambulatory Visit: Payer: Self-pay

## 2021-01-15 ENCOUNTER — Encounter (HOSPITAL_BASED_OUTPATIENT_CLINIC_OR_DEPARTMENT_OTHER): Payer: Self-pay

## 2021-01-15 ENCOUNTER — Emergency Department (HOSPITAL_BASED_OUTPATIENT_CLINIC_OR_DEPARTMENT_OTHER)
Admission: EM | Admit: 2021-01-15 | Discharge: 2021-01-15 | Disposition: A | Discharge status: Home / Self Care | Payer: Medicaid Other | Attending: Emergency Medicine | Admitting: Emergency Medicine

## 2021-01-15 DIAGNOSIS — Z87891 Personal history of nicotine dependence: Secondary | ICD-10-CM | POA: Insufficient documentation

## 2021-01-15 DIAGNOSIS — Z91040 Latex allergy status: Secondary | ICD-10-CM | POA: Insufficient documentation

## 2021-01-15 DIAGNOSIS — R102 Pelvic and perineal pain: Secondary | ICD-10-CM | POA: Insufficient documentation

## 2021-01-15 DIAGNOSIS — M545 Low back pain, unspecified: Secondary | ICD-10-CM | POA: Insufficient documentation

## 2021-01-15 LAB — COMPREHENSIVE METABOLIC PANEL
ALT: 14 U/L (ref 0–44)
AST: 16 U/L (ref 15–41)
Albumin: 3.8 g/dL (ref 3.5–5.0)
Alkaline Phosphatase: 38 U/L (ref 38–126)
Anion gap: 4 — ABNORMAL LOW (ref 5–15)
BUN: 14 mg/dL (ref 6–20)
CO2: 28 mmol/L (ref 22–32)
Calcium: 8.8 mg/dL — ABNORMAL LOW (ref 8.9–10.3)
Chloride: 106 mmol/L (ref 98–111)
Creatinine, Ser: 0.75 mg/dL (ref 0.44–1.00)
GFR, Estimated: >60 mL/min (ref >60)
Glucose, Bld: 80 mg/dL (ref 70–99)
Potassium: 4.5 mmol/L (ref 3.5–5.1)
Sodium: 138 mmol/L (ref 135–145)
Total Bilirubin: 0.5 mg/dL (ref 0.3–1.2)
Total Protein: 6.7 g/dL (ref 6.5–8.1)

## 2021-01-15 LAB — URINALYSIS, ROUTINE W REFLEX MICROSCOPIC
Bilirubin Urine: NEGATIVE
Glucose, UA: NEGATIVE mg/dL
Hgb urine dipstick: NEGATIVE
Ketones, ur: NEGATIVE mg/dL
Leukocytes,Ua: NEGATIVE
Nitrite: NEGATIVE
Protein, ur: NEGATIVE mg/dL
Specific Gravity, Urine: 1.02 (ref 1.005–1.030)
pH: 7.5 (ref 5.0–8.0)

## 2021-01-15 LAB — CBC
HCT: 42 % (ref 36.0–46.0)
Hemoglobin: 13.3 g/dL (ref 12.0–15.0)
MCH: 27.5 pg (ref 26.0–34.0)
MCHC: 31.7 g/dL (ref 30.0–36.0)
MCV: 86.8 fL (ref 80.0–100.0)
Platelets: 241 10*3/uL (ref 150–400)
RBC: 4.84 MIL/uL (ref 3.87–5.11)
RDW: 13.6 % (ref 11.5–15.5)
WBC: 4.1 10*3/uL (ref 4.0–10.5)
nRBC: 0 % (ref 0.0–0.2)

## 2021-01-15 LAB — PREGNANCY, URINE: Preg Test, Ur: NEGATIVE

## 2021-01-15 LAB — LIPASE, BLOOD: Lipase: 31 U/L (ref 11–51)

## 2021-01-15 MED ORDER — METHOCARBAMOL 500 MG PO TABS: 500.0000 mg | ORAL_TABLET | Freq: Two times a day (BID) | ORAL | 0 refills | Status: DC

## 2021-01-15 MED ORDER — NAPROXEN 500 MG PO TABS: 500.0000 mg | ORAL_TABLET | Freq: Two times a day (BID) | ORAL | 0 refills | Status: DC

## 2021-01-15 NOTE — ED Provider Notes (Signed)
[facility] EMERGENCY DEPARTMENT Provider Note  CSN: [id number] Arrival date & time: 01/15/21 0831    History Chief Complaint  Patient presents with   Abdominal Pain    Ameila Lopez is a 25 y.o. female presents for evaluation of 3-4 weeks of lower abdominal discomfort and vaginal spotting. She has not had a menstrual period since August but she also has an IUD. She has also been having some lower back pains during that time worse with movement. She has been to Gyn twice recently for same and reassured her IUD is in the proper place but no one has addressed her back pain. She has taken some APAP with some relief.    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   Herpes    Vision abnormalities     Past Surgical History:  Procedure Laterality Date   DILATION AND EVACUATION N/A 04/21/2016   Procedure: DILATATION AND EVACUATION;  Surgeon: Lavina Hamman, MD;  Location: [facility];  Service: Gynecology;  Laterality: N/A;   WISDOM TOOTH EXTRACTION      Family History  Problem Relation Age of Onset   Depression Mother     Social History   Tobacco Use   Smoking status: Former    Types: Cigars   Smokeless tobacco: Never   Tobacco comments:    with marijuana  Vaping Use   Vaping Use: Never used  Substance Use Topics   Alcohol use: Not Currently    Comment: social   Drug use: Not Currently    Frequency: 14.0 times per week    Types: Marijuana    Comment: Last use July 2021     Home Medications Prior to Admission medications   Medication Sig Start Date End Date Taking? Authorizing Provider  methocarbamol (ROBAXIN) 500 MG tablet Take 1 tablet (500 mg total) by mouth 2 (two) times daily. 01/15/21  Yes Pollyann Savoy, MD  naproxen (NAPROSYN) 500 MG tablet Take 1 tablet (500 mg total) by mouth 2 (two) times daily. 01/15/21  Yes Pollyann Savoy, MD  cetirizine (ZYRTEC) 10 MG tablet Take 10 mg by mouth daily.    [provider]  coconut oil OIL Apply 1 application topically as needed (nipple pain). 01/22/20   Sheila Oats, MD  polyethylene glycol (MIRALAX / GLYCOLAX) 17 g packet Take 17 g by mouth daily. 01/22/20   Sheila Oats, MD  Prenatal Vit-Fe Phos-FA-Omega (VITAFOL GUMMIES) 3.33-0.333-34.8 MG CHEW Chew 1 tablet by mouth daily. 07/30/19   Burleson, Brand Males, NP  senna-docusate (SENOKOT-S) 8.6-50 MG tablet Take 2 tablets by mouth daily. 01/23/20   Sheila Oats, MD     Allergies    Feraheme [ferumoxytol] and Latex   Review of Systems   Review of Systems A comprehensive review of systems was completed and negative except as noted in HPI.    Physical Exam BP 102/69   Pulse (!) 54   Temp 98.3 F (36.8 C) (Oral)   Resp 17   Ht 5\' 1"  (1.549 m)   Wt 52.2 kg   LMP 11/13/2020 (Exact Date)   SpO2 100%   BMI 21.73 kg/m   Physical Exam Vitals and nursing note reviewed.  Constitutional:      Appearance: Normal appearance.  HENT:     Head: Normocephalic and atraumatic.  Nose: Nose normal.     Mouth/Throat:     Mouth: Mucous membranes are moist.  Eyes:     Extraocular Movements: Extraocular movements intact.     Conjunctiva/sclera: Conjunctivae normal.  Cardiovascular:     Rate and Rhythm: Normal rate.  Pulmonary:     Effort: Pulmonary effort is normal.     Breath sounds: Normal breath sounds.  Abdominal:     General: Abdomen is flat.     Palpations: Abdomen is soft.     Tenderness: There is no abdominal tenderness. There is no guarding.  Musculoskeletal:        General: Tenderness (mild diffuse lower back soft tissues) present. No swelling. Normal range of motion.     Cervical back: Neck supple.  Skin:    General: Skin is warm and dry.  Neurological:     General: No focal deficit present.     Mental Status: She is alert.  Psychiatric:        Mood and Affect: Mood normal.     ED  Results / Procedures / Treatments   Labs (all labs ordered are listed, but only abnormal results are displayed) Labs Reviewed  COMPREHENSIVE METABOLIC PANEL - Abnormal; Notable for the following components:      Result Value   Calcium 8.8 (*)    Anion gap 4 (*)    All other components within normal limits  LIPASE, BLOOD  CBC  URINALYSIS, ROUTINE W REFLEX MICROSCOPIC  PREGNANCY, URINE    EKG None   Radiology No results found.  Procedures Procedures  Medications Ordered in the ED Medications - No data to display   MDM Rules/Calculators/A&P MDM   ED Course  I have reviewed the triage vital signs and the nursing notes.  Pertinent labs & imaging results that were available during my care of the patient were reviewed by me and considered in my medical decision making (see chart for details).  Clinical Course as of 01/15/21 1009  Fri Jan 15, 2021  0951 CBC, CMP and lipase are normal.  [CS]  1008 UA and preg are neg. Patient reassured ED workup is benign. Will treat back pain with Naprosyn and robaxin, recommend Gyn follow up as needed if not improving.  [CS]    Clinical Course User Index [CS] Pollyann Savoy, MD    Final Clinical Impression(s) / ED Diagnoses Final diagnoses:  Acute bilateral low back pain without sciatica  Pelvic pain    Rx / DC Orders ED Discharge Orders          Ordered    naproxen (NAPROSYN) 500 MG tablet  2 times daily        01/15/21 1009    methocarbamol (ROBAXIN) 500 MG tablet  2 times daily        01/15/21 1009             Pollyann Savoy, MD 01/15/21 1009

## 2021-01-15 NOTE — ED Triage Notes (Signed)
Pt reports lower abdominal pain and lower back pain for several weeks. Pt endorses hematuria. LMP 8/5. Denies vaginal discharge/bleeding, N/V/D.

## 2021-01-25 ENCOUNTER — Ambulatory Visit: Payer: Medicaid Other | Admitting: Advanced Practice Midwife

## 2021-01-25 ENCOUNTER — Ambulatory Visit: Payer: Medicaid Other | Admitting: Obstetrics and Gynecology

## 2022-06-29 ENCOUNTER — Ambulatory Visit (INDEPENDENT_AMBULATORY_CARE_PROVIDER_SITE_OTHER): Payer: Medicaid Other

## 2022-06-29 ENCOUNTER — Encounter: Payer: Self-pay | Admitting: Obstetrics and Gynecology

## 2022-06-29 ENCOUNTER — Other Ambulatory Visit: Payer: Self-pay

## 2022-06-29 ENCOUNTER — Other Ambulatory Visit (HOSPITAL_COMMUNITY)
Admission: RE | Admit: 2022-06-29 | Discharge: 2022-06-29 | Disposition: A | Discharge status: Home / Self Care | Payer: Medicaid Other | Source: Ambulatory Visit | Attending: Obstetrics and Gynecology | Admitting: Obstetrics and Gynecology

## 2022-06-29 VITALS — BP 119/82 | HR 94

## 2022-06-29 DIAGNOSIS — Z202 Contact with and (suspected) exposure to infections with a predominantly sexual mode of transmission: Secondary | ICD-10-CM | POA: Insufficient documentation

## 2022-06-29 MED ORDER — DOXYCYCLINE HYCLATE 100 MG PO CAPS
100.0000 mg | ORAL_CAPSULE | Freq: Two times a day (BID) | ORAL | 0 refills | Status: DC
Start: 2022-06-29 — End: 2022-09-27

## 2022-06-29 NOTE — Progress Notes (Unsigned)
SUBJECTIVE:  27 y.o. female who desires a STI screen. Notes recent Exposure to CT and vaginal discharge. No UTI symptoms. .  No LMP recorded.  OBJECTIVE:  She appears well.   ASSESSMENT:  STI Screen  Vaginal discharge  PLAN:  Pt offered STI blood screening: N/A GC, chlamydia, and trichomonas,BV , YEAST per pt request  probe sent to lab.  Treatment: Rx sent due to exposure /partner with +CT.  Pt follow up as needed.

## 2022-06-29 NOTE — Progress Notes (Signed)
Pt called noted exposure to CT pt wants testing and treatment.  Rx sent per protocol to pt pharmacy.

## 2022-06-30 LAB — CERVICOVAGINAL ANCILLARY ONLY
Bacterial Vaginitis (gardnerella): POSITIVE — AB
Candida Glabrata: NEGATIVE
Candida Vaginitis: NEGATIVE
Chlamydia: NEGATIVE
Comment: NEGATIVE
Comment: NEGATIVE
Comment: NEGATIVE
Comment: NEGATIVE
Comment: NEGATIVE
Comment: NORMAL
Neisseria Gonorrhea: NEGATIVE
Trichomonas: NEGATIVE

## 2022-07-01 ENCOUNTER — Other Ambulatory Visit: Payer: Self-pay | Admitting: Obstetrics and Gynecology

## 2022-07-01 MED ORDER — METRONIDAZOLE 500 MG PO TABS: 500.0000 mg | ORAL_TABLET | Freq: Two times a day (BID) | ORAL | 0 refills | Status: DC

## 2022-07-07 ENCOUNTER — Encounter: Payer: Self-pay | Admitting: Obstetrics and Gynecology

## 2022-08-09 ENCOUNTER — Emergency Department (HOSPITAL_BASED_OUTPATIENT_CLINIC_OR_DEPARTMENT_OTHER)
Admission: EM | Admit: 2022-08-09 | Discharge: 2022-08-09 | Disposition: A | Discharge status: Home / Self Care | Payer: Medicaid Other | Attending: Emergency Medicine | Admitting: Emergency Medicine

## 2022-08-09 ENCOUNTER — Other Ambulatory Visit: Payer: Self-pay

## 2022-08-09 ENCOUNTER — Encounter (HOSPITAL_BASED_OUTPATIENT_CLINIC_OR_DEPARTMENT_OTHER): Payer: Self-pay | Admitting: Emergency Medicine

## 2022-08-09 DIAGNOSIS — M79605 Pain in left leg: Secondary | ICD-10-CM | POA: Diagnosis not present

## 2022-08-09 DIAGNOSIS — R059 Cough, unspecified: Secondary | ICD-10-CM | POA: Diagnosis not present

## 2022-08-09 DIAGNOSIS — M79604 Pain in right leg: Secondary | ICD-10-CM | POA: Insufficient documentation

## 2022-08-09 DIAGNOSIS — R2243 Localized swelling, mass and lump, lower limb, bilateral: Secondary | ICD-10-CM | POA: Diagnosis not present

## 2022-08-09 DIAGNOSIS — M791 Myalgia, unspecified site: Secondary | ICD-10-CM | POA: Diagnosis not present

## 2022-08-09 LAB — COMPREHENSIVE METABOLIC PANEL
ALT: 6 U/L (ref 0–44)
AST: 10 U/L — ABNORMAL LOW (ref 15–41)
Albumin: 3.8 g/dL (ref 3.5–5.0)
Alkaline Phosphatase: 58 U/L (ref 38–126)
Anion gap: 7 (ref 5–15)
BUN: 8 mg/dL (ref 6–20)
CO2: 27 mmol/L (ref 22–32)
Calcium: 9.2 mg/dL (ref 8.9–10.3)
Chloride: 105 mmol/L (ref 98–111)
Creatinine, Ser: 0.71 mg/dL (ref 0.44–1.00)
GFR, Estimated: >60 mL/min (ref >60)
Glucose, Bld: 90 mg/dL (ref 70–99)
Potassium: 3.7 mmol/L (ref 3.5–5.1)
Sodium: 139 mmol/L (ref 135–145)
Total Bilirubin: 0.4 mg/dL (ref 0.3–1.2)
Total Protein: 6.9 g/dL (ref 6.5–8.1)

## 2022-08-09 LAB — URINALYSIS, ROUTINE W REFLEX MICROSCOPIC
Bilirubin Urine: NEGATIVE
Glucose, UA: NEGATIVE mg/dL
Hgb urine dipstick: NEGATIVE
Ketones, ur: NEGATIVE mg/dL
Leukocytes,Ua: NEGATIVE
Nitrite: NEGATIVE
Protein, ur: NEGATIVE mg/dL
Specific Gravity, Urine: 1.01 (ref 1.005–1.030)
pH: 6 (ref 5.0–8.0)

## 2022-08-09 LAB — CBC
HCT: 40.2 % (ref 36.0–46.0)
Hemoglobin: 13.1 g/dL (ref 12.0–15.0)
MCH: 27.2 pg (ref 26.0–34.0)
MCHC: 32.6 g/dL (ref 30.0–36.0)
MCV: 83.6 fL (ref 80.0–100.0)
Platelets: 308 10*3/uL (ref 150–400)
RBC: 4.81 MIL/uL (ref 3.87–5.11)
RDW: 12.4 % (ref 11.5–15.5)
WBC: 13.1 10*3/uL — ABNORMAL HIGH (ref 4.0–10.5)
nRBC: 0 % (ref 0.0–0.2)

## 2022-08-09 LAB — CK: Total CK: 65 U/L (ref 38–234)

## 2022-08-09 LAB — PREGNANCY, URINE: Preg Test, Ur: NEGATIVE

## 2022-08-09 LAB — D-DIMER, QUANTITATIVE: D-Dimer, Quant: 0.5 ug/mL-FEU (ref 0.00–0.50)

## 2022-08-09 MED ORDER — NAPROXEN 500 MG PO TABS: 500.0000 mg | ORAL_TABLET | Freq: Two times a day (BID) | ORAL | 0 refills | Status: DC

## 2022-08-09 MED ORDER — NAPROXEN 250 MG PO TABS
500.0000 mg | ORAL_TABLET | Freq: Once | ORAL | Status: AC
  Administered 2022-08-09: 500 mg via ORAL
  Filled 2022-08-09: qty 2

## 2022-08-09 NOTE — ED Triage Notes (Signed)
Pt presents for inner thigh pain and swelling, tender to palpation, bilateral, x2 days but worse today.   Denies fall, straining, heavy exercise.    Denies dark urine

## 2022-08-09 NOTE — ED Provider Notes (Signed)
[facility]ERGENCY [facility] Emergency Department Provider Note MRN:  [id number]  Arrival date & time: 08/09/22     Chief Complaint   Leg Pain   History of Present Illness   Haley Lopez is a 27 y.o. year-old female with no pertinent past medical history presenting to the ED with chief complaint of leg pain.  Pain to the inner thighs bilaterally and concerned that her legs are swollen.  Sore to the touch.  Denies any new exertional activities.  Having some soreness to the arms and back and neck as well, some sore throat, some cough recently.  Review of Systems  A thorough review of systems was obtained and all systems are negative except as noted in the HPI and PMH.   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   Herpes    Vision abnormalities     Past Surgical History:  Procedure Laterality Date   DILATION AND EVACUATION N/A 04/21/2016   Procedure: DILATATION AND EVACUATION;  Surgeon: Lavina Hamman, MD;  Location: [facility];  Service: Gynecology;  Laterality: N/A;   WISDOM TOOTH EXTRACTION      Family History  Problem Relation Age of Onset   Depression Mother     Social History   Socioeconomic History   Marital status: Single    Spouse name: Not on file   Number of children: Not on file   Years of education: Not on file   Highest education level: Not on file  Occupational History   Not on file  Tobacco Use   Smoking status: Former    Types: Cigars   Smokeless tobacco: Never   Tobacco comments:    with marijuana  Vaping Use   Vaping Use: Never used  Substance and Sexual Activity   Alcohol use: Not Currently    Comment: social   Drug use: Not Currently    Frequency: 14.0 times per week    Types: Marijuana    Comment: Last use July 2021   Sexual activity: Not Currently    Birth control/protection: None   Other Topics Concern   Not on file  Social History Narrative   Not on file   Social Determinants of Health   Financial Resource Strain: Not on file  Food Insecurity: Not on file  Transportation Needs: Not on file  Physical Activity: Not on file  Stress: Not on file  Social Connections: Not on file  Intimate Partner Violence: Not on file     Physical Exam   Vitals:   08/09/22 0114  BP: 130/74  Pulse: 60  Resp: 18  Temp: 98.4 F (36.9 C)  SpO2: 100%    CONSTITUTIONAL: Well-appearing, NAD NEURO/PSYCH:  Alert and oriented x 3, no focal deficits EYES:  eyes equal and reactive ENT/NECK:  no LAD, no JVD CARDIO: Regular rate, well-perfused, normal S1 and S2 PULM:  CTAB no wheezing or rhonchi GI/GU:  non-distended, non-tender MSK/SPINE:  No gross deformities, no edema SKIN:  no rash, atraumatic   *Additional and/or pertinent findings included in MDM below  Diagnostic and Interventional Summary    EKG Interpretation  Date/Time:    Ventricular Rate:    PR Interval:    QRS Duration:   QT Interval:    QTC Calculation:   R Axis:     Text Interpretation:  Labs Reviewed  CBC - Abnormal; Notable for the following components:      Result Value   WBC 13.1 (*)    All other components within normal limits  COMPREHENSIVE METABOLIC PANEL - Abnormal; Notable for the following components:   AST 10 (*)    All other components within normal limits  URINALYSIS, ROUTINE W REFLEX MICROSCOPIC - Abnormal; Notable for the following components:   Color, Urine COLORLESS (*)    All other components within normal limits  CK  D-DIMER, QUANTITATIVE  PREGNANCY, URINE    No orders to display    Medications  naproxen (NAPROSYN) tablet 500 mg (500 mg Oral Given 08/09/22 0245)     Procedures  /  Critical Care Procedures  ED Course and Medical Decision Making  Initial Impression and Ddx Favoring viral illness with myalgias, also considering rhabdomyolysis, DVT.  Legs  appear normal on my exam, neurovascularly intact, no erythema, no increased warmth, doubt infection, low concern for DVT.  Past medical/surgical history that increases complexity of ED encounter: None  Interpretation of Diagnostics I personally reviewed the laboratory assessment and my interpretation is as follows: No significant blood count or electrolyte disturbance  D-dimer negative, CK negative  Patient Reassessment and Ultimate Disposition/Management     Discharge  Patient management required discussion with the following services or consulting groups:  None  Complexity of Problems Addressed Acute complicated illness or Injury  Additional Data Reviewed and Analyzed Further history obtained from: None  Additional Factors Impacting ED Encounter Risk Prescriptions  Elmer Sow. Pilar Plate, MD [facility] Emergency Medicine [facility] [email or web address]  Final Clinical Impressions(s) / ED Diagnoses     ICD-10-CM   1. Myalgia  M79.10       ED Discharge Orders          Ordered    naproxen (NAPROSYN) 500 MG tablet  2 times daily        08/09/22 0343             Discharge Instructions Discussed with and Provided to Patient:    Discharge Instructions      You were evaluated in the Emergency Department and after careful evaluation, we did not find any emergent condition requiring admission or further testing in the hospital.  Your exam/testing today was overall reassuring.  Blood test did not show any abnormalities or emergencies.  Suspect your muscle pains are related to a viral illness.  Take the Naprosyn twice daily as needed for discomfort.  Please return to the Emergency Department if you experience any worsening of your condition.  Thank you for allowing [place] to be a part of your care.       Sabas Sous, MD 08/09/22 [phone]

## 2022-08-09 NOTE — Discharge Instructions (Addendum)
You were evaluated in the Emergency Department and after careful evaluation, we did not find any emergent condition requiring admission or further testing in the hospital.  Your exam/testing today was overall reassuring.  Blood test did not show any abnormalities or emergencies.  Suspect your muscle pains are related to a viral illness.  Take the Naprosyn twice daily as needed for discomfort.  Please return to the Emergency Department if you experience any worsening of your condition.  Thank you for allowing [place] to be a part of your care.

## 2022-09-27 ENCOUNTER — Other Ambulatory Visit (HOSPITAL_COMMUNITY)
Admission: RE | Admit: 2022-09-27 | Discharge: 2022-09-27 | Disposition: A | Discharge status: Home / Self Care | Payer: Medicaid Other | Source: Ambulatory Visit | Attending: Advanced Practice Midwife | Admitting: Advanced Practice Midwife

## 2022-09-27 ENCOUNTER — Ambulatory Visit (INDEPENDENT_AMBULATORY_CARE_PROVIDER_SITE_OTHER): Payer: Medicaid Other | Admitting: Advanced Practice Midwife

## 2022-09-27 ENCOUNTER — Encounter: Payer: Self-pay | Admitting: Advanced Practice Midwife

## 2022-09-27 VITALS — BP 125/73 | HR 54 | Ht 61.0 in | Wt 129.2 lb

## 2022-09-27 DIAGNOSIS — Z124 Encounter for screening for malignant neoplasm of cervix: Secondary | ICD-10-CM

## 2022-09-27 DIAGNOSIS — N898 Other specified noninflammatory disorders of vagina: Secondary | ICD-10-CM | POA: Insufficient documentation

## 2022-09-27 DIAGNOSIS — R300 Dysuria: Secondary | ICD-10-CM

## 2022-09-27 DIAGNOSIS — Z8041 Family history of malignant neoplasm of ovary: Secondary | ICD-10-CM

## 2022-09-27 DIAGNOSIS — Z01419 Encounter for gynecological examination (general) (routine) without abnormal findings: Secondary | ICD-10-CM | POA: Diagnosis not present

## 2022-09-27 DIAGNOSIS — Z113 Encounter for screening for infections with a predominantly sexual mode of transmission: Secondary | ICD-10-CM

## 2022-09-27 DIAGNOSIS — Z30432 Encounter for removal of intrauterine contraceptive device: Secondary | ICD-10-CM | POA: Diagnosis not present

## 2022-09-27 DIAGNOSIS — Z3009 Encounter for other general counseling and advice on contraception: Secondary | ICD-10-CM | POA: Diagnosis not present

## 2022-09-27 DIAGNOSIS — Z3041 Encounter for surveillance of contraceptive pills: Secondary | ICD-10-CM

## 2022-09-27 DIAGNOSIS — Z83719 Family history of colon polyps, unspecified: Secondary | ICD-10-CM | POA: Diagnosis not present

## 2022-09-27 MED ORDER — NORETHINDRONE ACET-ETHINYL EST 1-20 MG-MCG PO TABS: 1.0000 | ORAL_TABLET | Freq: Every day | ORAL | 11 refills | Status: AC

## 2022-09-27 NOTE — Progress Notes (Signed)
Pt presents for AEX. Requesting STD testing. Requesting IUD removal, interested in [facility] pills.   Family Hx of ovarian and colon cancer. Pt possibly has Lupus

## 2022-09-27 NOTE — Progress Notes (Signed)
Subjective:     Haley Lopez is a 27 y.o. female here at [facility] for a routine exam.  Current complaints: none, desires IUD removal, switch to OCPs at this time.  Personal health questionnaire reviewed: yes.  Do you have a primary care provider? yes Do you feel safe at home? yes    Health Maintenance Due  Topic Date Due   COVID-19 Vaccine (1) Never done   HPV VACCINES (1 - 2-dose series) Never done   DTaP/Tdap/Td (7 - Td or Tdap) 01/03/2018   PAP-Cervical Cytology Screening  07/30/2022   PAP SMEAR-Modifier  07/30/2022     Risk factors for chronic health problems: Smoking: Alchohol/how much: Pt BMI: Body mass index is 24.41 kg/m.   Gynecologic History No LMP recorded. (Menstrual status: IUD). Contraception: IUD Last Pap: 2021. Results were: normal Last mammogram: n/a.   Obstetric History OB History  Gravida Para Term Preterm AB Living  6 3 1 2 3 3   SAB IAB Ectopic Multiple Live Births  3 0 0 0 3    # Outcome Date GA Lbr Len/2nd Weight Sex Delivery Anes PTL Lv  6 Preterm 01/20/20 [redacted]w[redacted]d  4 lb 15 oz (2.24 kg) M Vag-Spont None  LIV     Birth Comments: WDL  5 Term 09/06/18 [redacted]w[redacted]d  5 lb 5 oz (2.41 kg) M Vag-Spont EPI  LIV     Birth Comments: WDL  4 SAB 04/21/16 [redacted]w[redacted]d   U         Birth Comments: Needed D&E for missed abortion      Complications: Missed abortion  3 Preterm 04/08/15 [redacted]w[redacted]d 05:42 / 00:15 3 lb 12.7 oz (1.72 kg) F Vag-Spont EPI  LIV     Birth Comments: Dr Ambrose Mantle documented [redacted]w[redacted]d  2 SAB           1 SAB              The following portions of the patient's history were reviewed and updated as appropriate: allergies, current medications, past family history, past medical history, past social history, past surgical history, and problem list.  Review of Systems Pertinent items noted in HPI and remainder of comprehensive ROS otherwise negative.    Objective:  BP 125/73   Pulse (!) 54   Ht 5\' 1"  (1.549 m)   Wt 129 lb 3.2 oz (58.6 kg)   BMI 24.41 kg/m   VS  reviewed, nursing note reviewed,  Constitutional: well developed, well nourished, no distress HEENT: normocephalic, thyroid without enlargement or mass HEART: RRR, no murmurs rubs/gallops RESP: clear and equal to auscultation bilaterally in all lobes  Breast Exam:  exam performed: right breast normal without mass, skin or nipple changes or axillary nodes, left breast normal without mass, skin or nipple changes or axillary nodes Abdomen: soft Neuro: alert and oriented x 3 Skin: warm, dry Psych: affect normal Pelvic exam: Performed: Cervix pink, visually closed, without lesion, scant white creamy discharge, vaginal walls and external genitalia normal Bimanual exam: Cervix 0/long/high, firm, anterior, neg CMT, uterus nontender, nonenlarged, adnexa without tenderness, enlargement, or mass     IUD Removal  Patient was in the dorsal lithotomy position, normal external genitalia was noted.  A speculum was placed in the patient's vagina, normal discharge was noted, no lesions. The multiparous cervix was visualized, no lesions, no abnormal discharge.  The strings of the IUD were grasped and pulled using ring forceps. Gentle traction for 1-2 minutes was required as IUD did not easily come out  with initial attempt at removal. The IUD was removed in its entirety. Patient tolerated the procedure well.    Patient will use OCPs for contraception for now and plans for pregnancy soon and she was told to avoid teratogens, take PNV and folic acid.  Routine preventative health maintenance measures emphasized.     Assessment/Plan:   1. Encounter for annual routine gynecological examination   2. Screening examination for STD (sexually transmitted disease)  - HIV antibody (with reflex) - RPR - Hepatitis C Antibody - Hepatitis B Surface AntiGEN - Cervicovaginal ancillary only( [place])  3. Screening for cervical cancer - Cytology - PAP( [place])  4. Family history of malignant neoplasm of  ovary --Discussed testing, benefits/risks of information. - [facility] Multi-Cancer (2+38)  5. Family history of colonic polyps --Discussed testing, benefits/risks of information. - [facility] Multi-Cancer (2+38)  6. Encounter for IUD removal --IUD removed without difficulty.  Pt partner can feel string, she may desire conception in the next few months. Discussed options including leaving IUD in until conception is planned, but pt prefers removal. --See procedure note.  7. Encounter for counseling regarding contraception --Discussed pt contraceptive plans and reviewed contraceptive methods based on pt preferences and effectiveness.  Pt prefers to remove IUD and start low dose OCPs today.  - norethindrone-ethinyl estradiol (JUNEL 1/20) 1-20 MG-MCG tablet; Take 1 tablet by mouth daily.  Dispense: 28 tablet; Refill: 11  8. Vaginal irritation --Swab pending  9. Dysuria  - Urine Culture   Return in about 1 year (around 09/27/2023).   Sharen Counter, CNM 9:38 AM

## 2022-09-28 LAB — CERVICOVAGINAL ANCILLARY ONLY
Bacterial Vaginitis (gardnerella): POSITIVE — AB
Candida Glabrata: NEGATIVE
Candida Vaginitis: NEGATIVE
Chlamydia: NEGATIVE
Comment: NEGATIVE
Comment: NEGATIVE
Comment: NEGATIVE
Comment: NEGATIVE
Comment: NEGATIVE
Comment: NORMAL
Neisseria Gonorrhea: NEGATIVE
Trichomonas: NEGATIVE

## 2022-09-28 LAB — HEPATITIS C ANTIBODY: Hep C Virus Ab: NONREACTIVE

## 2022-09-28 LAB — RPR: RPR Ser Ql: NONREACTIVE

## 2022-09-28 LAB — HIV ANTIBODY (ROUTINE TESTING W REFLEX): HIV Screen 4th Generation wRfx: NONREACTIVE

## 2022-09-28 LAB — HEPATITIS B SURFACE ANTIGEN: Hepatitis B Surface Ag: NEGATIVE

## 2022-09-30 ENCOUNTER — Encounter: Payer: Self-pay | Admitting: Advanced Practice Midwife

## 2022-09-30 LAB — URINE CULTURE

## 2022-10-01 MED ORDER — METRONIDAZOLE 500 MG PO TABS: 500.0000 mg | ORAL_TABLET | Freq: Two times a day (BID) | ORAL | 0 refills | Status: AC

## 2022-10-01 NOTE — Addendum Note (Signed)
Addended by: Sharen Counter A on: 10/01/2022 03:47 AM   Modules accepted: Orders

## 2022-10-03 LAB — CYTOLOGY - PAP
Adequacy: ABSENT
Diagnosis: NEGATIVE

## 2022-10-06 LAB — EMPOWER MULTI-CANCER (2 + 38): REPORT SUMMARY: NEGATIVE

## 2022-10-17 ENCOUNTER — Encounter: Payer: Self-pay | Admitting: Advanced Practice Midwife

## 2022-10-18 ENCOUNTER — Encounter (HOSPITAL_BASED_OUTPATIENT_CLINIC_OR_DEPARTMENT_OTHER): Payer: Self-pay | Admitting: Emergency Medicine

## 2022-10-18 ENCOUNTER — Other Ambulatory Visit: Payer: Self-pay

## 2022-10-18 ENCOUNTER — Emergency Department (HOSPITAL_BASED_OUTPATIENT_CLINIC_OR_DEPARTMENT_OTHER)
Admission: EM | Admit: 2022-10-18 | Discharge: 2022-10-18 | Disposition: A | Discharge status: Home / Self Care | Payer: MEDICAID | Attending: Emergency Medicine | Admitting: Emergency Medicine

## 2022-10-18 DIAGNOSIS — R102 Pelvic and perineal pain: Secondary | ICD-10-CM | POA: Insufficient documentation

## 2022-10-18 DIAGNOSIS — R112 Nausea with vomiting, unspecified: Secondary | ICD-10-CM | POA: Insufficient documentation

## 2022-10-18 DIAGNOSIS — Z87891 Personal history of nicotine dependence: Secondary | ICD-10-CM | POA: Diagnosis not present

## 2022-10-18 LAB — URINALYSIS, ROUTINE W REFLEX MICROSCOPIC
Bilirubin Urine: NEGATIVE
Glucose, UA: NEGATIVE mg/dL
Hgb urine dipstick: NEGATIVE
Ketones, ur: NEGATIVE mg/dL
Leukocytes,Ua: NEGATIVE
Nitrite: NEGATIVE
Protein, ur: NEGATIVE mg/dL
Specific Gravity, Urine: 1.015 (ref 1.005–1.030)
pH: 7 (ref 5.0–8.0)

## 2022-10-18 LAB — WET PREP, GENITAL
Sperm: NONE SEEN
Trich, Wet Prep: NONE SEEN
WBC, Wet Prep HPF POC: <10 (ref <10)
Yeast Wet Prep HPF POC: NONE SEEN

## 2022-10-18 LAB — PREGNANCY, URINE: Preg Test, Ur: NEGATIVE

## 2022-10-18 MED ORDER — ACETAMINOPHEN 500 MG PO TABS
1000.0000 mg | ORAL_TABLET | Freq: Once | ORAL | Status: AC
  Administered 2022-10-18: 1000 mg via ORAL
  Filled 2022-10-18: qty 2

## 2022-10-18 MED ORDER — METRONIDAZOLE 500 MG PO TABS: 500.0000 mg | ORAL_TABLET | Freq: Two times a day (BID) | ORAL | 0 refills | Status: AC

## 2022-10-18 MED ORDER — NAPROXEN 500 MG PO TABS: 500.0000 mg | ORAL_TABLET | Freq: Two times a day (BID) | ORAL | 0 refills | Status: AC

## 2022-10-18 NOTE — Discharge Instructions (Signed)
You were evaluated in the Emergency Department and after careful evaluation, we did not find any emergent condition requiring admission or further testing in the hospital.  Your exam/testing today was overall reassuring.  We are treating you for bacterial vaginosis.  Take the Flagyl antibiotic as directed.  Follow-up with your regular doctor if symptoms continue.  Please return to the Emergency Department if you experience any worsening of your condition.  Thank you for allowing [place] to be a part of your care.

## 2022-10-18 NOTE — ED Provider Notes (Signed)
[facility]GENCY DEPT [facility] Emergency Department Provider Note MRN:  [id number]  Arrival date & time: 10/18/22     Chief Complaint   Abdominal Pain   History of Present Illness   Haley Lopez is a 27 y.o. year-old female with no pertinent medical history presenting to the ED with chief complaint of abdominal pain.  Left pelvic abdominal pain for the past week.  Associated with a lot of diarrhea.  Single episode of nausea vomiting a few days ago.  Also recently had her IUD removed, wondering if she is pregnant.  Had some bleeding right after removal but not at this time.  Small amount of vaginal discharge at this time which is somewhat normal for her.  She has a low but present concern for possible STD at this time.  Review of Systems  A thorough review of systems was obtained and all systems are negative except as noted in the HPI and PMH.   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   Herpes    Vision abnormalities     Past Surgical History:  Procedure Laterality Date   DILATION AND EVACUATION N/A 04/21/2016   Procedure: DILATATION AND EVACUATION;  Surgeon: Lavina Hamman, MD;  Location: [facility];  Service: Gynecology;  Laterality: N/A;   WISDOM TOOTH EXTRACTION      Family History  Problem Relation Age of Onset   Depression Mother    Cancer - Colon Maternal Grandmother    Cancer - Ovarian Maternal Grandmother     Social History   Socioeconomic History   Marital status: Single    Spouse name: Not on file   Number of children: Not on file   Years of education: Not on file   Highest education level: Not on file  Occupational History   Not on file  Tobacco Use   Smoking status: Former    Types: Cigars   Smokeless tobacco: Never   Tobacco comments:    with marijuana  Vaping Use   Vaping Use: Never used   Substance and Sexual Activity   Alcohol use: Yes    Comment: social   Drug use: Not Currently    Frequency: 14.0 times per week    Types: Marijuana    Comment: Last use July 2021   Sexual activity: Yes    Birth control/protection: None, I.U.D.  Other Topics Concern   Not on file  Social History Narrative   Not on file   Social Determinants of Health   Financial Resource Strain: Not on file  Food Insecurity: Not on file  Transportation Needs: Not on file  Physical Activity: Not on file  Stress: Not on file  Social Connections: Not on file  Intimate Partner Violence: Not on file     Physical Exam   Vitals:   10/18/22 0428  BP: 132/83  Pulse: (!) 57  Resp: 18  Temp: 98.6 F (37 C)  SpO2: 100%    CONSTITUTIONAL: Well-appearing, NAD NEURO/PSYCH:  Alert and oriented x 3, no focal deficits EYES:  eyes equal and reactive ENT/NECK:  no LAD, no JVD CARDIO: Regular rate, well-perfused, normal S1 and S2 PULM:  CTAB no wheezing or rhonchi GI/GU:  non-distended, non-tender MSK/SPINE:  No gross deformities, no edema SKIN:  no rash, atraumatic   *Additional and/or pertinent  findings included in MDM below  Diagnostic and Interventional Summary    EKG Interpretation Date/Time:    Ventricular Rate:    PR Interval:    QRS Duration:    QT Interval:    QTC Calculation:   R Axis:      Text Interpretation:         Labs Reviewed  WET PREP, GENITAL - Abnormal; Notable for the following components:      Result Value   Clue Cells Wet Prep HPF POC PRESENT (*)    All other components within normal limits  PREGNANCY, URINE  URINALYSIS, ROUTINE W REFLEX MICROSCOPIC  GC/CHLAMYDIA PROBE AMP ([place]) NOT AT [facility]    No orders to display    Medications  acetaminophen (TYLENOL) tablet 1,000 mg (1,000 mg Oral Given 10/18/22 0457)     Procedures  /  Critical Care Procedures  ED Course and Medical Decision Making  Initial Impression and Ddx Vital signs normal, abdomen  completely soft and nontender with no rebound guarding or rigidity.  Highly doubt emergent process, doubt PID, considering ovarian cyst, early pregnancy, ectopic pregnancy possibility will follow-up on hCG.  UTI, BV, trichomonas, yeast infection all considerations.  Mild colitis/diarrheal illness would also fit the illness script.  Past medical/surgical history that increases complexity of ED encounter: None  Interpretation of Diagnostics Urinalysis normal, hCG negative, wet prep positive for clue cells.  Patient Reassessment and Ultimate Disposition/Management     Discharge  Patient management required discussion with the following services or consulting groups:  None  Complexity of Problems Addressed Acute complicated illness or Injury  Additional Data Reviewed and Analyzed Further history obtained from: None  Additional Factors Impacting ED Encounter Risk Prescriptions  Elmer Sow. Pilar Plate, MD [facility] Emergency Medicine [facility] [email or web address]  Final Clinical Impressions(s) / ED Diagnoses     ICD-10-CM   1. Pelvic pain in female  R10.2       ED Discharge Orders          Ordered    naproxen (NAPROSYN) 500 MG tablet  2 times daily        10/18/22 0507    metroNIDAZOLE (FLAGYL) 500 MG tablet  2 times daily        10/18/22 0507             Discharge Instructions Discussed with and Provided to Patient:     Discharge Instructions      You were evaluated in the Emergency Department and after careful evaluation, we did not find any emergent condition requiring admission or further testing in the hospital.  Your exam/testing today was overall reassuring.  We are treating you for bacterial vaginosis.  Take the Flagyl antibiotic as directed.  Follow-up with your regular doctor if symptoms continue.  Please return to the Emergency Department if you experience any worsening of your condition.  Thank you for allowing [place] to be a part of your  care.        Sabas Sous, MD 10/18/22 [phone]

## 2022-10-18 NOTE — ED Triage Notes (Signed)
Patient here with complaints of lower abdominal pain for the last week describes it as lower abdominal area and cramping- states the cramping has been constant. Patient states she did have her IUD removed with out incident on the 18th of June. Patient is concerned that she may be pregnant. Endorses emesis in the morning time. Does also endorse burning with urination. AOX4. Ambulatory.

## 2022-10-19 ENCOUNTER — Other Ambulatory Visit: Payer: MEDICAID

## 2022-10-19 DIAGNOSIS — Z3200 Encounter for pregnancy test, result unknown: Secondary | ICD-10-CM

## 2022-10-19 LAB — GC/CHLAMYDIA PROBE AMP (~~LOC~~) NOT AT ARMC
Chlamydia: NEGATIVE
Comment: NEGATIVE
Comment: NORMAL
Neisseria Gonorrhea: NEGATIVE

## 2022-10-19 NOTE — Progress Notes (Signed)
Hcg ordered today for possible pregnancy. Please see pt email encounters.

## 2022-10-20 LAB — BETA HCG QUANT (REF LAB): hCG Quant: <1 m[IU]/mL

## 2023-02-06 ENCOUNTER — Ambulatory Visit: Payer: MEDICAID | Admitting: Advanced Practice Midwife

## 2023-02-06 NOTE — Progress Notes (Deleted)
   GYNECOLOGY PROGRESS NOTE  History:  27 y.o. [id number] presents to [facility] office today for problem gyn visit. She reports *****.  She denies h/a, dizziness, shortness of breath, n/v, or fever/chills.    The following portions of the patient's history were reviewed and updated as appropriate: allergies, current medications, past family history, past medical history, past social history, past surgical history and problem list. Last pap smear on 09/27/22 was normal.  Health Maintenance Due  Topic Date Due   HPV VACCINES (1 - 3-dose series) Never done   DTaP/Tdap/Td (7 - Td or Tdap) 01/03/2018   INFLUENZA VACCINE  11/10/2022   COVID-19 Vaccine (1 - 2023-24 season) Never done     Review of Systems:  Pertinent items are noted in HPI.   Objective:  Physical Exam unknown if currently breastfeeding. VS reviewed, nursing note reviewed,  Constitutional: well developed, well nourished, no distress HEENT: normocephalic CV: normal rate Pulm/chest wall: normal effort Breast Exam: deferred Abdomen: soft Neuro: alert and oriented x 3 Skin: warm, dry Psych: affect normal Pelvic exam: Cervix pink, visually closed, without lesion, scant white creamy discharge, vaginal walls and external genitalia normal Bimanual exam: Cervix 0/long/high, firm, anterior, neg CMT, uterus nontender, nonenlarged, adnexa without tenderness, enlargement, or mass  Assessment & Plan:  There are no diagnoses linked to this encounter.  No follow-ups on file.   Sharen Counter, CNM 1:17 PM

## 2023-05-25 ENCOUNTER — Ambulatory Visit: Payer: MEDICAID

## 2023-06-14 ENCOUNTER — Telehealth: Payer: MEDICAID

## 2023-06-14 ENCOUNTER — Telehealth: Payer: MEDICAID | Admitting: Physician Assistant

## 2023-06-14 DIAGNOSIS — J069 Acute upper respiratory infection, unspecified: Secondary | ICD-10-CM | POA: Diagnosis not present

## 2023-06-14 MED ORDER — FLUTICASONE PROPIONATE 50 MCG/ACT NA SUSP
2.0000 | Freq: Every day | NASAL | 0 refills | Status: AC
Start: 2023-06-14 — End: ?

## 2023-06-14 MED ORDER — ALBUTEROL SULFATE HFA 108 (90 BASE) MCG/ACT IN AERS
1.0000 | INHALATION_SPRAY | Freq: Four times a day (QID) | RESPIRATORY_TRACT | 0 refills | Status: AC | PRN
Start: 2023-06-14 — End: ?

## 2023-06-14 MED ORDER — LIDOCAINE VISCOUS HCL 2 % MT SOLN
OROMUCOSAL | 0 refills | Status: AC
Start: 2023-06-14 — End: ?

## 2023-06-14 MED ORDER — PSEUDOEPH-BROMPHEN-DM 30-2-10 MG/5ML PO SYRP
5.0000 mL | ORAL_SOLUTION | Freq: Four times a day (QID) | ORAL | 0 refills | Status: AC | PRN
Start: 2023-06-14 — End: ?

## 2023-06-14 NOTE — Progress Notes (Signed)
 E-Visit for Upper Respiratory Infection   We are sorry you are not feeling well.  Here is how we plan to help!  Based on what you have shared with me, it looks like you may have a viral upper respiratory infection.  Upper respiratory infections are caused by a large number of viruses; however, rhinovirus is the most common cause.   Symptoms vary from person to person, with common symptoms including sore throat, cough, fatigue or lack of energy and feeling of general discomfort.  A low-grade fever of up to 100.4 may present, but is often uncommon.  Symptoms vary however, and are closely related to a person's age or underlying illnesses.  The most common symptoms associated with an upper respiratory infection are nasal discharge or congestion, cough, sneezing, headache and pressure in the ears and face.  These symptoms usually persist for about 3 to 10 days, but can last up to 2 weeks.  It is important to know that upper respiratory infections do not cause serious illness or complications in most cases.    Upper respiratory infections can be transmitted from person to person, with the most common method of transmission being a person's hands.  The virus is able to live on the skin and can infect other persons for up to 2 hours after direct contact.  Also, these can be transmitted when someone coughs or sneezes; thus, it is important to cover the mouth to reduce this risk.  To keep the spread of the illness at bay, good hand hygiene is very important.  This is an infection that is most likely caused by a virus. There are no specific treatments other than to help you with the symptoms until the infection runs its course.  We are sorry you are not feeling well.  Here is how we plan to help!   For nasal congestion, you may use an oral decongestants such as Mucinex D or if you have glaucoma or high blood pressure use plain Mucinex.  Saline nasal spray or nasal drops can help and can safely be used as often as  needed for congestion.  For your congestion, I have prescribed Fluticasone nasal spray one spray in each nostril twice a day  If you do not have a history of heart disease, hypertension, diabetes or thyroid disease, prostate/bladder issues or glaucoma, you may also use Sudafed to treat nasal congestion.  It is highly recommended that you consult with a pharmacist or your primary care physician to ensure this medication is safe for you to take.     For cough I have prescribed for you Bromfed DM cough syrup Take 5mL every 6 hours as needed for cough.  I have prescribed Albuterol inhaler Use 1-2 puffs every 6 hours as needed for shortness of breath, chest tightness, and/or wheezing.  If you have a sore or scratchy throat, use a saltwater gargle-  to  teaspoon of salt dissolved in a 4-ounce to 8-ounce glass of warm water.  Gargle the solution for approximately 15-30 seconds and then spit.  It is important not to swallow the solution.  You can also use throat lozenges/cough drops and Chloraseptic spray to help with throat pain or discomfort.  Warm or cold liquids can also be helpful in relieving throat pain. I am prescribing Viscous Lidocaine 2% Swallow every 4-6 hours as needed for sore throat.  For headache, pain or general discomfort, you can use Ibuprofen or Tylenol as directed.   Some authorities believe that zinc sprays  or the use of Echinacea may shorten the course of your symptoms.   HOME CARE Only take medications as instructed by your medical team. Be sure to drink plenty of fluids. Water is fine as well as fruit juices, sodas and electrolyte beverages. You may want to stay away from caffeine or alcohol. If you are nauseated, try taking small sips of liquids. How do you know if you are getting enough fluid? Your urine should be a pale yellow or almost colorless. Get rest. Taking a steamy shower or using a humidifier may help nasal congestion and ease sore throat pain. You can place a towel  over your head and breathe in the steam from hot water coming from a faucet. Using a saline nasal spray works much the same way. Cough drops, hard candies and sore throat lozenges may ease your cough. Avoid close contacts especially the very young and the elderly Cover your mouth if you cough or sneeze Always remember to wash your hands.   GET HELP RIGHT AWAY IF: You develop worsening fever. If your symptoms do not improve within 10 days You develop yellow or green discharge from your nose over 3 days. You have coughing fits You develop a severe head ache or visual changes. You develop shortness of breath, difficulty breathing or start having chest pain Your symptoms persist after you have completed your treatment plan  MAKE SURE YOU  Understand these instructions. Will watch your condition. Will get help right away if you are not doing well or get worse.  Thank you for choosing an e-visit.  Your e-visit answers were reviewed by a board certified advanced clinical practitioner to complete your personal care plan. Depending upon the condition, your plan could have included both over the counter or prescription medications.  Please review your pharmacy choice. Make sure the pharmacy is open so you can pick up prescription now. If there is a problem, you may contact your provider through [employer] and have the prescription routed to another pharmacy.  Your safety is important to [place]. If you have drug allergies check your prescription carefully.   For the next 24 hours you can use MyChart to ask questions about today's visit, request a non-urgent call back, or ask for a work or school excuse. You will get an email in the next two days asking about your experience. I hope that your e-visit has been valuable and will speed your recovery.     I have spent 5 minutes in review of e-visit questionnaire, review and updating patient chart, medical decision making and response to patient.    Margaretann Loveless, PA-C

## 2023-07-21 NOTE — Progress Notes (Signed)
 Assessment 1. ANA positive  Lupus Anticoagulant Screen With Reflex   Beta-2 Glycoprotein 1 Antibodies, IgG and IgM   Anticardiolipin Antibodies (ACA), IgA, IgG, and IgM, Quantitative    2. History of miscarriage  Lupus Anticoagulant Screen With Reflex   Beta-2 Glycoprotein 1 Antibodies, IgG and IgM   Anticardiolipin Antibodies (ACA), IgA, IgG, and IgM, Quantitative    3. Multiple joint pain  C-Reactive Protein (CRP)   Sedimentation Rate (ESR)   XR Arthritis Hands 2 Views   XR Knee 3 Views Left   XR Knee 3 Views Right   Cyclic Citrullinated Peptide (CCP) Antibodies, IgA and IgG   celecoxib (CeleBREX) 200 mg capsule   DISCONTINUED: celecoxib (CeleBREX) 200 mg capsule      Plan/Orders Patient with arthralgias but no evidence of any inflammatory disease on exam today. We will investigate further with above studies. We will try NSAID. Very weakly positive anticardiolipin IgM, negative other APS, ?clinically significant.  Repeat APS labs. Return in about 6 months (around 01/20/2024).  Chief Complaint Haley Lopez is here for follow up   HPI Follow up positive ANA. Medical history of low vit D. ANA 1:160 ANA panel negative.  Weakly positive IgM anticardiolipin Negative lupus anticoagulant, negative beta glycoprotein ab. Complaints of pain in hands, knees, shoulders, leg pain and swelling.  3 miscarriages, 3 children alive and well. No inflammatory bowel or eye disease. No psoriasis.  Vital Signs BP 127/85 (BP Location: Left arm, Patient Position: Sitting)   Pulse 62   Ht 1.549 m (5' 1)   Wt 60.8 kg (134 lb)   SpO2 98%   BMI 25.32 kg/m  Physical Exam Pt is alert and oriented x 3. Answers questions appropriately.  Heart: normal S1, S2. No S3, no murmur, no thrills. No JVD. Lungs: no dyspnea observed, normal breath sounds on auscultation.  Extremities: no cyanosis, no edema, no calves tenderness. Skin: warm, dry. No hot swollen joints. FROM joints.   PMH Past Medical  History:  Diagnosis Date  . No known problems     PSH Past Surgical History:  Procedure Laterality Date  . DILATION AND CURETTAGE OF UTERUS     Procedure: DILATION AND CURETTAGE OF UTERUS  . WISDOM TOOTH EXTRACTION     Procedure: WISDOM TOOTH EXTRACTION    Allergies Ferumoxytol  and Latex  Medications Current Outpatient Medications on File Prior to Visit  Medication Sig Dispense Refill  . ergocalciferol (VITAMIN D2) 1,250 mcg (50,000 unit) capsule Take 1 capsule (50,000 Units total) by mouth once a week. 12 capsule 0  . [DISCONTINUED] levonorgestreL  (MIRENA ) 21 mcg/24 hours (8 yrs) 52 mg IUD 1 each by intrauterine route once.     Current Facility-Administered Medications on File Prior to Visit  Medication Dose Route Frequency Provider Last Rate Last Admin  . cyanocobalamin (VITAMIN B12) injection 1,000 mcg  1,000 mcg intramuscular Q14 Days Suzann Bryant Hedgecock, PA-C   1,000 mcg at 09/01/22 1051     Family Hx Family History  Problem Relation Name Age of Onset  . Hypertension Maternal Grandmother      Social Hx  Social History   Tobacco Use  . Smoking status: Some Days  . Smokeless tobacco: Never  Substance Use Topics  . Alcohol use: Yes  . Drug use: Yes    Types: Marijuana

## 2023-07-24 ENCOUNTER — Encounter: Payer: Self-pay | Admitting: Advanced Practice Midwife
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
## 2023-11-22 ENCOUNTER — Ambulatory Visit: Payer: MEDICAID | Admitting: Physician Assistant

## 2024-03-01 ENCOUNTER — Telehealth: Payer: MEDICAID | Admitting: Physician Assistant

## 2024-03-01 DIAGNOSIS — L309 Dermatitis, unspecified: Secondary | ICD-10-CM | POA: Diagnosis not present

## 2024-03-01 MED ORDER — TRIAMCINOLONE ACETONIDE 0.025 % EX OINT
1.0000 | TOPICAL_OINTMENT | Freq: Two times a day (BID) | CUTANEOUS | 0 refills | Status: AC
Start: 2024-03-01 — End: ?

## 2024-03-01 MED ORDER — DOXYCYCLINE HYCLATE 100 MG PO TABS
100.0000 mg | ORAL_TABLET | Freq: Two times a day (BID) | ORAL | 0 refills | Status: AC
Start: 2024-03-01 — End: ?
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
## 2024-03-01 NOTE — Patient Instructions (Signed)
 [facility], thank you for joining Haley CHRISTELLA Dickinson, Haley Lopez for today's virtual visit.  While this provider is not your primary care provider (PCP), if your PCP is located in our provider database this encounter information will be shared with them immediately following your visit.   A [facility] MyChart account gives you access to today's visit and all your visits, tests, and labs performed at [facility]  click here if you don't have a [facility] MyChart account or go to [email or web address]  Consent: (Patient) Haley Lopez provided verbal consent for this virtual visit at the beginning of the encounter.  Current Medications:  Current Outpatient Medications:    doxycycline  (VIBRA -TABS) 100 MG tablet, Take 1 tablet (100 mg total) by mouth 2 (two) times daily., Disp: 14 tablet, Rfl: 0   triamcinolone  (KENALOG ) 0.025 % ointment, Apply 1 Application topically 2 (two) times daily., Disp: 30 g, Rfl: 0   albuterol  (VENTOLIN  HFA) 108 (90 Base) MCG/ACT inhaler, Inhale 1-2 puffs into the lungs every 6 (six) hours as needed., Disp: 8 g, Rfl: 0   brompheniramine-pseudoephedrine -DM 30-2-10 MG/5ML syrup, Take 5 mLs by mouth 4 (four) times daily as needed., Disp: 120 mL, Rfl: 0   fluticasone  (FLONASE ) 50 MCG/ACT nasal spray, Place 2 sprays into both nostrils daily., Disp: 16 g, Rfl: 0   lidocaine  (XYLOCAINE ) 2 % solution, Swallow 5mL every 4-6 hours as needed for sore throat, Disp: 100 mL, Rfl: 0   naproxen  (NAPROSYN ) 500 MG tablet, Take 1 tablet (500 mg total) by mouth 2 (two) times daily., Disp: 30 tablet, Rfl: 0   norethindrone -ethinyl estradiol (JUNEL 1/20) 1-20 MG-MCG tablet, Take 1 tablet by mouth daily., Disp: 28 tablet, Rfl: 11   polyethylene glycol (MIRALAX  / GLYCOLAX ) 17 g packet, Take 17 g by mouth daily. (Patient not taking: Reported on 09/27/2022), Disp: 14 each, Rfl: 0   Medications ordered in this encounter:  Meds ordered this encounter  Medications   doxycycline   (VIBRA -TABS) 100 MG tablet    Sig: Take 1 tablet (100 mg total) by mouth 2 (two) times daily.    Dispense:  14 tablet    Refill:  0    Supervising Provider:   LAMPTEY, PHILIP O [[id number]]   triamcinolone  (KENALOG ) 0.025 % ointment    Sig: Apply 1 Application topically 2 (two) times daily.    Dispense:  30 g    Refill:  0    Supervising Provider:   BLAISE ALEENE KIDD [[id number]]     *If you need refills on other medications prior to your next appointment, please contact your pharmacy*  Follow-Up: Call back or seek an in-person evaluation if the symptoms worsen or if the condition fails to improve as anticipated.  [facility] [phone]  Other Instructions Contact Dermatitis Dermatitis is redness, soreness, and swelling (inflammation) of the skin. Contact dermatitis is a reaction to certain substances that touch the skin. There are two types of this condition: Irritant contact dermatitis. This is the most common type. It happens when something irritates your skin, such as when your hands get dry from washing them too often with soap. You can get this type of reaction even if you have not been exposed to the irritant before. Allergic contact dermatitis. This type is caused by a substance that you are allergic to, such as poison ivy. It occurs when you have been exposed to the substance (allergen) and form a sensitivity to it. In some cases, the reaction may start soon after your  first exposure to the allergen. In other cases, it may not start until you are exposed to the allergen again. It may then occur every time you are exposed to the allergen in the future. What are the causes? Irritant contact dermatitis is often caused by exposure to: Makeup. Soaps, detergents, and bleaches. Acids. Metal salts, such as nickel. Allergic contact dermatitis is often caused by exposure to: Poisonous plants. Chemicals. Jewelry. Latex. Medicines. Preservatives in products, such as  clothes. What increases the risk? You are more likely to get this condition if you have: A job that exposes you to irritants or allergens. Certain medical conditions. These include asthma and eczema. What are the signs or symptoms? Symptoms of this condition may occur in any place on your body that has been touched by the irritant. Symptoms include: Dryness, flaking, or cracking. Redness. Itching. Pain or a burning feeling. Blisters. Drainage of small amounts of blood or clear fluid from skin cracks. With allergic contact dermatitis, there may also be swelling in areas such as the eyelids, mouth, or genitals. How is this diagnosed? This condition is diagnosed with a medical history and physical exam. A patch skin test may be done to help figure out the cause. If the condition is related to your job, you may need to see an expert in health problems in the workplace (occupational medicine specialist). How is this treated? This condition is treated by staying away from the cause of the reaction and protecting your skin from further contact. Treatment may also include: Steroid creams or ointments. Steroid medicines may need be taken by mouth (orally) in more severe cases. Antibiotics or medicines applied to the skin to kill bacteria (antibacterial ointments). These may be needed if a skin infection is present. Antihistamines. These may be taken orally or put on as a lotion to ease itching. A bandage (dressing). Follow these instructions at home: Skin care Moisturize your skin as needed. Put cool, wet cloths (cool compresses) on the affected areas. Try applying baking soda paste to your skin. Stir water  into baking soda until it has the consistency of a paste. Do not scratch your skin. Avoid friction to the affected area. Avoid the use of soaps, perfumes, and dyes. Check the affected areas every day for signs of infection. Check for: More redness, swelling, or pain. More fluid or  blood. Warmth. Pus or a bad smell. Medicines Take or apply over-the-counter and prescription medicines only as told by your health care provider. If you were prescribed antibiotics, take or apply them as told by your health care provider. Do not stop using the antibiotic even if you start to feel better. Bathing Try taking a bath with: Epsom salts. Follow the instructions on the packaging. You can get these at your local pharmacy or grocery store. Baking soda. Pour a small amount into the bath as told by your health care provider. Colloidal oatmeal. Follow the instructions on the packaging. You can get this at your local pharmacy or grocery store. Bathe less often. This may mean bathing every other day. Bathe in lukewarm water . Avoid using hot water . Bandage care If you were given a dressing, change it as told by your health care provider. Wash your hands with soap and water  for at least 20 seconds before and after you change your dressing. If soap and water  are not available, use hand sanitizer. General instructions Avoid the substance that caused your reaction. If you do not know what caused it, keep a journal to try  to track what caused it. Write down: What you eat and drink. What cosmetics you use. What you wear in the affected area. This includes jewelry. Contact a health care provider if: Your condition does not get better with treatment. Your condition gets worse. You have any signs of infection. You have a fever. You have new symptoms. Your bone or joint under the affected area becomes painful after the skin has healed. Get help right away if: You notice red streaks coming from the affected area. The affected area turns darker. You have trouble breathing. This information is not intended to replace advice given to you by your health care provider. Make sure you discuss any questions you have with your health care provider. Document Revised: 10/01/2021 Document Reviewed:  10/01/2021 Elsevier Patient Education  2024 [employer]   If you have been instructed to have an in-person evaluation today at a local Urgent Care facility, please use the link below. It will take you to a list of all of our available [facility], including address, phone number and hours of operation. Please do not delay care.  [facility]  If you or a family member do not have a primary care provider, use the link below to schedule a visit and establish care. When you choose a [facility] primary care physician or advanced practice provider, you gain a long-term partner in health. Find a Primary Care Provider  Learn more about [facility]'s in-office and virtual care options: [facility] - Get Care Now

## 2024-03-12 ENCOUNTER — Encounter: Payer: Self-pay | Admitting: Family Medicine

## 2024-03-12 ENCOUNTER — Telehealth: Payer: MEDICAID

## 2024-03-12 ENCOUNTER — Telehealth: Payer: MEDICAID | Admitting: Family Medicine

## 2024-03-12 DIAGNOSIS — Z87898 Personal history of other specified conditions: Secondary | ICD-10-CM

## 2024-03-12 DIAGNOSIS — T71193A Asphyxiation due to mechanical threat to breathing due to other causes, assault, initial encounter: Secondary | ICD-10-CM | POA: Diagnosis not present

## 2024-03-12 DIAGNOSIS — J029 Acute pharyngitis, unspecified: Secondary | ICD-10-CM | POA: Diagnosis not present
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
## 2024-03-12 NOTE — Patient Instructions (Addendum)
 Haley Lopez, thank you for joining Haley CHRISTELLA Barefoot, NP for today's virtual visit.  While this provider is not your primary care provider (PCP), if your PCP is located in our provider database this encounter information will be shared with them immediately following your visit.   A [facility] MyChart account gives you access to today's visit and all your visits, tests, and labs performed at [facility]  click here if you don't have a [facility] MyChart account or go to [email or web address]  Consent: (Patient) Haley Lopez provided verbal consent for this virtual visit at the beginning of the encounter.  Current Medications:  Current Outpatient Medications:    albuterol  (VENTOLIN  HFA) 108 (90 Base) MCG/ACT inhaler, Inhale 1-2 puffs into the lungs every 6 (six) hours as needed., Disp: 8 g, Rfl: 0   brompheniramine-pseudoephedrine -DM 30-2-10 MG/5ML syrup, Take 5 mLs by mouth 4 (four) times daily as needed., Disp: 120 mL, Rfl: 0   doxycycline  (VIBRA -TABS) 100 MG tablet, Take 1 tablet (100 mg total) by mouth 2 (two) times daily., Disp: 14 tablet, Rfl: 0   fluticasone  (FLONASE ) 50 MCG/ACT nasal spray, Place 2 sprays into both nostrils daily., Disp: 16 g, Rfl: 0   lidocaine  (XYLOCAINE ) 2 % solution, Swallow 5mL every 4-6 hours as needed for sore throat, Disp: 100 mL, Rfl: 0   naproxen  (NAPROSYN ) 500 MG tablet, Take 1 tablet (500 mg total) by mouth 2 (two) times daily., Disp: 30 tablet, Rfl: 0   norethindrone -ethinyl estradiol (JUNEL 1/20) 1-20 MG-MCG tablet, Take 1 tablet by mouth daily., Disp: 28 tablet, Rfl: 11   polyethylene glycol (MIRALAX  / GLYCOLAX ) 17 g packet, Take 17 g by mouth daily. (Patient not taking: Reported on 09/27/2022), Disp: 14 each, Rfl: 0   triamcinolone  (KENALOG ) 0.025 % ointment, Apply 1 Application topically 2 (two) times daily., Disp: 30 g, Rfl: 0   Medications ordered in this encounter:  No orders of the defined types were placed in this encounter.    *If  you need refills on other medications prior to your next appointment, please contact your pharmacy*  Follow-Up: Call back or seek an in-person evaluation if the symptoms worsen or if the condition fails to improve as anticipated.  [facility] [phone]  Other Instructions Intimate Partner Violence: What to Know Intimate partner violence (IPV) is a type of domestic abuse where one person uses a pattern of behaviors to gain or keep power and control over their current or former partner. IPV can happen to anyone. IPV can happen with or without a history of sexual intimacy. Abuse tends to get worse, especially after a relationship ends. What are types and examples of IPV? Abuse can vary. There could be one serious and isolated incident, or many less severe episodes spread out over many years. Examples of IPV include: Emotional and psychological abuse. This includes words or actions to make someone feel scared or worthless. This often starts small and gets worse over time. Examples include yelling, name-calling, mocking, controlling what the person wears, keeping them away from friends and family, hurting pets, destroying things, and making the partner feel trapped. This also includes making threats to hurt themselves, the partner, or people the partner cares about. Physical abuse. This means hurting someone on purpose. Examples include slapping, hitting, kicking, pushing, grabbing, biting, pulling hair, and more. This also includes stopping the partner from getting medical care or forcing them to use alcohol or drugs. Sexual abuse. This is any sexual activity that happens without  clear permission. Examples include making the partner feel forced to do things because they are scared of what might happen if they don't. This also includes sex acts when the person can't say no because they have had too much alcohol or taken drugs. Economic or financial abuse. This is controlling  someone's money or resources. Examples include controlling money, food, transportation, or belongings. This also includes stopping the partner from working or choosing a career. Stalking. This includes giving unwanted attention over and over again. Examples include making repeated unwanted contact with the person or their friends and family (calls, emails, texts), following or watching them, making threats, damaging property, and sending unwanted gifts. Digital or technological abuse. This is using technology to bully, harass, or scare someone. Examples include monitoring online activities, demanding passwords, tracking where the person is through their phone, pressuring them to make sexual content, and controlling how they use phones and other devices. Spiritual abuse. This means using religious or spiritual beliefs to control someone. Examples include making fun of their beliefs, forcing them to follow a certain faith, and stopping them from practicing their own beliefs. What are some signs of IPV? Physical signs Physical signs may include: Injuries like bruises, broken bones, burns, or head trauma that don't fit the story given. Injuries with a clear pattern, like the shape of a belt buckle or burns that look like a cigarette. Injuries on the face or neck. Health problems or pain from injuries you can't see. Emotional and psychological signs IPV can lead to anxiety, depression, eating disorders, PTSD, and trouble sleeping. Other symptoms include: Feeling sad, hopeless, tired, or numb. Thinking about or trying to hurt themselves. This can be suicidal or not. Having low self-esteem. Feeling very nervous or worried, especially about their partner, and being easily startled. Believing they can't do anything right and deserve bad treatment. Not trusting other people or their own feelings. Sexual signs The can include: Bruising, swelling, bleeding, or pain in the area of the genital or opening  of the butt (rectum). Not wanting or being afraid of sex. What are common reasons people affected by IPV stay in an abusive relationship? Common reasons why people stay in abusive relationships include: Fear. Leaving an abusive relationship is very hard and scary. People may be afraid of what their partner might do, worry about how to live on their own, or fear losing their children. Normalized abuse. If someone grew up in an abusive environment, they might not realize their relationship is unhealthy. Shame. Admitting they are being abused can be tough because they may feel it's their fault or think they are weak. Abusers often make them feel this way. Intimidation. Threats, whether they are verbal, physical, or involve private information, can make people too scared to leave. Low self-esteem. Abuse can make survivors believe they deserve it and that it's their fault. Lack of resources. Not having money or support can make it seem impossible to leave. Disability. If a person relies on their partner for physical help, they might feel they can't leave. Immigration status. Fear of affecting their immigration status, especially if there are language barriers, can stop them from reporting abuse. Cultural context. Traditional beliefs and cultural values can influence someone to stay in an abusive relationship. Children. Feeling guilty about breaking up the family can make people stay. Love. People might still love their partner and hope things will get better. What should you do if you experience IPV? Make sure you stay safe. You know your situation  best, so trust your instincts. Call 911 if you need immediate help or medical attention. You can also ask the police about getting an emergency protective order if needed. Take threats seriously. Threats mean danger. Leaving an abusive relationship can be especially risky because the abuser might react badly. This is often the most dangerous time for people  trying to escape abuse. Make a safety plan. A safety plan can help you stay safe whether you are still in the relationship, planning to leave, or have already left. Prepare your children. Teach them what to do in an emergency, like where to hide in the house or how to call the police or a trusted person for help. Find support.Reach out to a national hotline or talk to someone you trust, like a friend or family member. You deserve support no matter what. Think about your legal options. You can report the abuse to the police or work with a lawyer to get a protective order. Consider counseling. Counseling can help you deal with the emotional effects of abuse. What you talk about with a counselor is private. Children can also benefit from counseling. Take care of yourself. Be kind to yourself and remember that experiencing abuse is not your fault. Where can you get help? Intimate partner violence hotlines and websites If you don't feel safe looking for help online at home or on your phone, use a computer at a toll brothers to go online. The Victim Connect Resource Center: Call or text 855-4VICTIM 564-425-1826). Live chat is also available at [employer]. Use the Victim Economist search tool to find to search for national and local resources at [employer] Domestic Violence Hotline. Call 800-799-SAFE 215-008-4310) or Text START to 585-492-8061. Live chat is also available at thehotline.org Search for local support and resources (including shelters) at [employer] for [employer] Sexual Assault Hotline: 707-784-0437 (HOPE). Live chat is also available at rainn.org Get help right away if: You feel like you are in immediate danger. You feel like you may hurt yourself or others. You have thoughts about taking your own life. You have other thoughts or feelings that worry you. These symptoms may be an emergency. Take one of these steps right away: Go to your nearest  emergency room. Call 911. Call the Suicide & Crisis Lifeline (free and confidential): Call 305-769-4089 or 988. Text 610-611-7726. If you're a Veteran: Call 988 and press 1. Text the [employer] at 870 035 4441. This information is not intended to replace advice given to you by your health care provider. Make sure you discuss any questions you have with your health care provider. Document Revised: 01/12/2023 Document Reviewed: 01/12/2023 Elsevier Patient Education  [employer].  If you have been instructed to have an in-person evaluation today at a local Urgent Care facility, please use the link below. It will take you to a list of all of our available [facility], including address, phone number and hours of operation. Please do not delay care.  [facility]  If you or a family member do not have a primary care provider, use the link below to schedule a visit and establish care. When you choose a [facility] primary care physician or advanced practice provider, you gain a long-term partner in health. Find a Primary Care Provider  Learn more about [facility]'s in-office and virtual care options:  - Get Care Now
